# Patient Record
Sex: Female | Born: 1980 | Race: White | Hispanic: No | Marital: Single | Smoking: Current every day smoker
Health system: Southern US, Community
[De-identification: ages and names within clinical notes are randomized; demographics above are authoritative.]

## PROBLEM LIST (undated history)

## (undated) DIAGNOSIS — F4310 Post-traumatic stress disorder, unspecified: Secondary | ICD-10-CM

## (undated) DIAGNOSIS — K08129 Complete loss of teeth due to periodontal diseases, unspecified class: Secondary | ICD-10-CM

## (undated) DIAGNOSIS — F32A Depression, unspecified: Secondary | ICD-10-CM

## (undated) DIAGNOSIS — F1910 Other psychoactive substance abuse, uncomplicated: Secondary | ICD-10-CM

## (undated) DIAGNOSIS — E559 Vitamin D deficiency, unspecified: Secondary | ICD-10-CM

## (undated) DIAGNOSIS — K219 Gastro-esophageal reflux disease without esophagitis: Secondary | ICD-10-CM

## (undated) DIAGNOSIS — F410 Panic disorder [episodic paroxysmal anxiety] without agoraphobia: Secondary | ICD-10-CM

## (undated) DIAGNOSIS — F419 Anxiety disorder, unspecified: Secondary | ICD-10-CM

## (undated) DIAGNOSIS — M419 Scoliosis, unspecified: Secondary | ICD-10-CM

## (undated) DIAGNOSIS — N3281 Overactive bladder: Secondary | ICD-10-CM

## (undated) DIAGNOSIS — F329 Major depressive disorder, single episode, unspecified: Secondary | ICD-10-CM

## (undated) HISTORY — DX: Anxiety disorder, unspecified: F41.9

## (undated) HISTORY — DX: Complete loss of teeth due to periodontal diseases, unspecified class: K08.129

## (undated) HISTORY — DX: Other psychoactive substance abuse, uncomplicated: F19.10

## (undated) HISTORY — DX: Major depressive disorder, single episode, unspecified: F32.9

## (undated) HISTORY — DX: Depression, unspecified: F32.A

## (undated) HISTORY — DX: Scoliosis, unspecified: M41.9

---

## 2007-05-31 ENCOUNTER — Emergency Department (HOSPITAL_COMMUNITY): Admission: EM | Admit: 2007-05-31 | Discharge: 2007-05-31 | Payer: Self-pay | Admitting: Emergency Medicine

## 2007-09-01 ENCOUNTER — Other Ambulatory Visit: Admission: RE | Admit: 2007-09-01 | Discharge: 2007-09-01 | Payer: Self-pay | Admitting: Obstetrics and Gynecology

## 2008-03-23 ENCOUNTER — Other Ambulatory Visit: Admission: RE | Admit: 2008-03-23 | Discharge: 2008-03-23 | Payer: Self-pay | Admitting: Obstetrics and Gynecology

## 2008-08-08 ENCOUNTER — Ambulatory Visit (HOSPITAL_COMMUNITY): Admission: RE | Admit: 2008-08-08 | Discharge: 2008-08-08 | Payer: Self-pay | Admitting: Obstetrics and Gynecology

## 2008-09-27 ENCOUNTER — Other Ambulatory Visit: Admission: RE | Admit: 2008-09-27 | Discharge: 2008-09-27 | Payer: Self-pay | Admitting: Obstetrics and Gynecology

## 2009-06-02 ENCOUNTER — Emergency Department (HOSPITAL_COMMUNITY): Admission: EM | Admit: 2009-06-02 | Discharge: 2009-06-02 | Payer: Self-pay | Admitting: Emergency Medicine

## 2009-07-23 ENCOUNTER — Emergency Department (HOSPITAL_COMMUNITY): Admission: EM | Admit: 2009-07-23 | Discharge: 2009-07-23 | Payer: Self-pay | Admitting: Emergency Medicine

## 2009-08-04 ENCOUNTER — Emergency Department (HOSPITAL_COMMUNITY): Admission: EM | Admit: 2009-08-04 | Discharge: 2009-08-04 | Payer: Self-pay | Admitting: Emergency Medicine

## 2011-09-17 LAB — URINALYSIS, ROUTINE W REFLEX MICROSCOPIC
Ketones, ur: NEGATIVE
Nitrite: NEGATIVE
Protein, ur: NEGATIVE

## 2011-09-17 LAB — PREGNANCY, URINE: Preg Test, Ur: NEGATIVE

## 2012-10-10 ENCOUNTER — Emergency Department (HOSPITAL_COMMUNITY): Payer: Medicaid Other

## 2012-10-10 ENCOUNTER — Encounter (HOSPITAL_COMMUNITY): Payer: Self-pay | Admitting: *Deleted

## 2012-10-10 ENCOUNTER — Emergency Department (HOSPITAL_COMMUNITY)
Admission: EM | Admit: 2012-10-10 | Discharge: 2012-10-11 | Disposition: A | Discharge status: Home / Self Care | Payer: Medicaid Other | Attending: Emergency Medicine | Admitting: Emergency Medicine

## 2012-10-10 DIAGNOSIS — J029 Acute pharyngitis, unspecified: Secondary | ICD-10-CM | POA: Insufficient documentation

## 2012-10-10 DIAGNOSIS — R112 Nausea with vomiting, unspecified: Secondary | ICD-10-CM | POA: Insufficient documentation

## 2012-10-10 DIAGNOSIS — R197 Diarrhea, unspecified: Secondary | ICD-10-CM | POA: Insufficient documentation

## 2012-10-10 DIAGNOSIS — F17200 Nicotine dependence, unspecified, uncomplicated: Secondary | ICD-10-CM | POA: Insufficient documentation

## 2012-10-10 DIAGNOSIS — J40 Bronchitis, not specified as acute or chronic: Secondary | ICD-10-CM | POA: Insufficient documentation

## 2012-10-10 DIAGNOSIS — J3489 Other specified disorders of nose and nasal sinuses: Secondary | ICD-10-CM | POA: Insufficient documentation

## 2012-10-10 LAB — PREGNANCY, URINE: Preg Test, Ur: NEGATIVE

## 2012-10-10 LAB — URINALYSIS, ROUTINE W REFLEX MICROSCOPIC
Glucose, UA: NEGATIVE mg/dL
Leukocytes, UA: NEGATIVE
Protein, ur: NEGATIVE mg/dL
pH: 6 (ref 5.0–8.0)

## 2012-10-10 LAB — URINE MICROSCOPIC-ADD ON

## 2012-10-10 MED ORDER — OXYCODONE-ACETAMINOPHEN 5-325 MG PO TABS
1.0000 | ORAL_TABLET | Freq: Once | ORAL | Status: AC
  Administered 2012-10-10: 1 via ORAL
  Filled 2012-10-10: qty 1

## 2012-10-10 NOTE — ED Provider Notes (Signed)
History   This chart was scribed for Cheri Guppy, MD by Melba Coon, ED Scribe. The patient was seen in room APA16A/APA16A and the patient's care was started at 9:52PM.    CSN: [id number]  Arrival date & time 10/10/12  Debbie Mueller   First MD Initiated Contact with Patient 10/10/12 2146      Chief Complaint  Patient presents with  . Cough  . Sore Throat  . Nasal Congestion  . Nausea  . Diarrhea  . Emesis    (Consider location/radiation/quality/duration/timing/severity/associated sxs/prior treatment) The history is provided by the patient. No language interpreter was used.   Debbie Mueller is a 31 y.o. female who presents to the Emergency Department complaining of persistent moderate to severe fever with associated symptoms of nasal congestion, productive cough with bright green phlegm, sore throat, nausea, emesis, and diarrhea with an onset yesterday. OTC medications at home have not alleviated the symptoms. She denies any contact with sick individuals with similar symptoms. She reports chills and diaphoresis. She denies history of asthma, kidney problems, and liver problems. No other pertinent medical symptoms.  History reviewed. No pertinent past medical history.  History reviewed. No pertinent past surgical history.  History reviewed. No pertinent family history.  History  Substance Use Topics  . Smoking status: Current Every Day Smoker  . Smokeless tobacco: Not on file  . Alcohol Use: No    OB History    Grav Para Term Preterm Abortions TAB SAB Ect Mult Living                  Review of Systems  Respiratory: Positive for cough.   Gastrointestinal: Positive for vomiting and diarrhea.   10 Systems reviewed and all are negative for acute change except as noted in the HPI.   Allergies  Review of patient's allergies indicates no known allergies.  Home Medications   Current Outpatient Rx  Name  Route  Sig  Dispense  Refill  . THERAFLU COLD & SORE THROAT  PO   Oral   Take 2 capsules by mouth every 6 (six) hours as needed. Cold/sore throat           BP 129/77  Pulse 95  Temp 99.7 F (37.6 C) (Oral)  Resp 20  Ht 5\' 2"  (1.575 m)  Wt 192 lb (87.091 kg)  BMI 35.12 kg/m2  SpO2 97%  LMP 08/29/2012  Physical Exam  Nursing note and vitals reviewed. Constitutional:       Awake, alert, nontoxic appearance.  HENT:  Head: Normocephalic and atraumatic.  Mouth/Throat: Oropharynx is clear and moist.  Eyes: Right eye exhibits no discharge. Left eye exhibits no discharge.  Neck: Neck supple.  Cardiovascular: Normal rate, regular rhythm, normal heart sounds and intact distal pulses.   No murmur heard. Pulmonary/Chest: Effort normal and breath sounds normal. No respiratory distress. She has no wheezes. She has no rales. She exhibits no tenderness.  Abdominal: Soft. There is no tenderness. There is no rebound.  Musculoskeletal: She exhibits no tenderness.       Baseline ROM, no obvious new focal weakness.  Neurological:       Mental status and motor strength appears baseline for patient and situation.  Skin: No rash noted.  Psychiatric: She has a normal mood and affect.    ED Course  Procedures (including critical care time)  COORDINATION OF CARE:  9:56PM - percocet, CXR, and UA will be ordered for Mrs Halloran.   Labs Reviewed  URINALYSIS, ROUTINE W REFLEX  MICROSCOPIC - Abnormal; Notable for the following:    Hgb urine dipstick SMALL (*)     All other components within normal limits  URINE MICROSCOPIC-ADD ON - Abnormal; Notable for the following:    Squamous Epithelial / LPF MANY (*)     Bacteria, UA FEW (*)     All other components within normal limits  PREGNANCY, URINE  URINE CULTURE   No results found.   No diagnosis found.    MDM  Bronchitis   I personally performed the services described in this documentation, which was scribed in my presence. The recorded information has been reviewed and is  accurate.        Cheri Guppy, MD 10/11/12 0010

## 2012-10-10 NOTE — ED Notes (Addendum)
Pt c/o cough, congestion, nausea, vomiting, sore throat and diarrhea x 4 days. Last menstrual period 1.5 months ago.

## 2012-10-11 MED ORDER — BENZONATATE 100 MG PO CAPS: 100.0000 mg | ORAL_CAPSULE | Freq: Three times a day (TID) | ORAL | Status: DC

## 2012-10-11 MED ORDER — OXYCODONE-ACETAMINOPHEN 5-325 MG PO TABS: 2.0000 | ORAL_TABLET | ORAL | Status: DC | PRN

## 2012-10-11 NOTE — ED Notes (Signed)
Assumed care for discharge.  Discharge instructions given and reviewed with patient.  Prescriptions given for Tessalon and Percocet; effects and use explained.  Patient verbalized understanding of sedating effects of medication and states understanding not to drive or do anything that requires mental alertness while taking.  Patient ambulatory with steady gait; discharged home in good condition.

## 2012-10-12 LAB — URINE CULTURE

## 2013-12-01 DIAGNOSIS — F410 Panic disorder [episodic paroxysmal anxiety] without agoraphobia: Secondary | ICD-10-CM

## 2013-12-01 DIAGNOSIS — F4310 Post-traumatic stress disorder, unspecified: Secondary | ICD-10-CM

## 2013-12-01 DIAGNOSIS — F419 Anxiety disorder, unspecified: Secondary | ICD-10-CM

## 2013-12-01 HISTORY — DX: Anxiety disorder, unspecified: F41.9

## 2013-12-01 HISTORY — DX: Panic disorder (episodic paroxysmal anxiety): F41.0

## 2013-12-01 HISTORY — DX: Post-traumatic stress disorder, unspecified: F43.10

## 2014-10-09 IMAGING — CR DG CHEST 2V
2 series · 2 of 2 positions shown · non-contrast
Comparison: None

CLINICAL DATA: Cough and sore throat

CHEST - 2 VIEW

[view not recorded (1 of 2)]
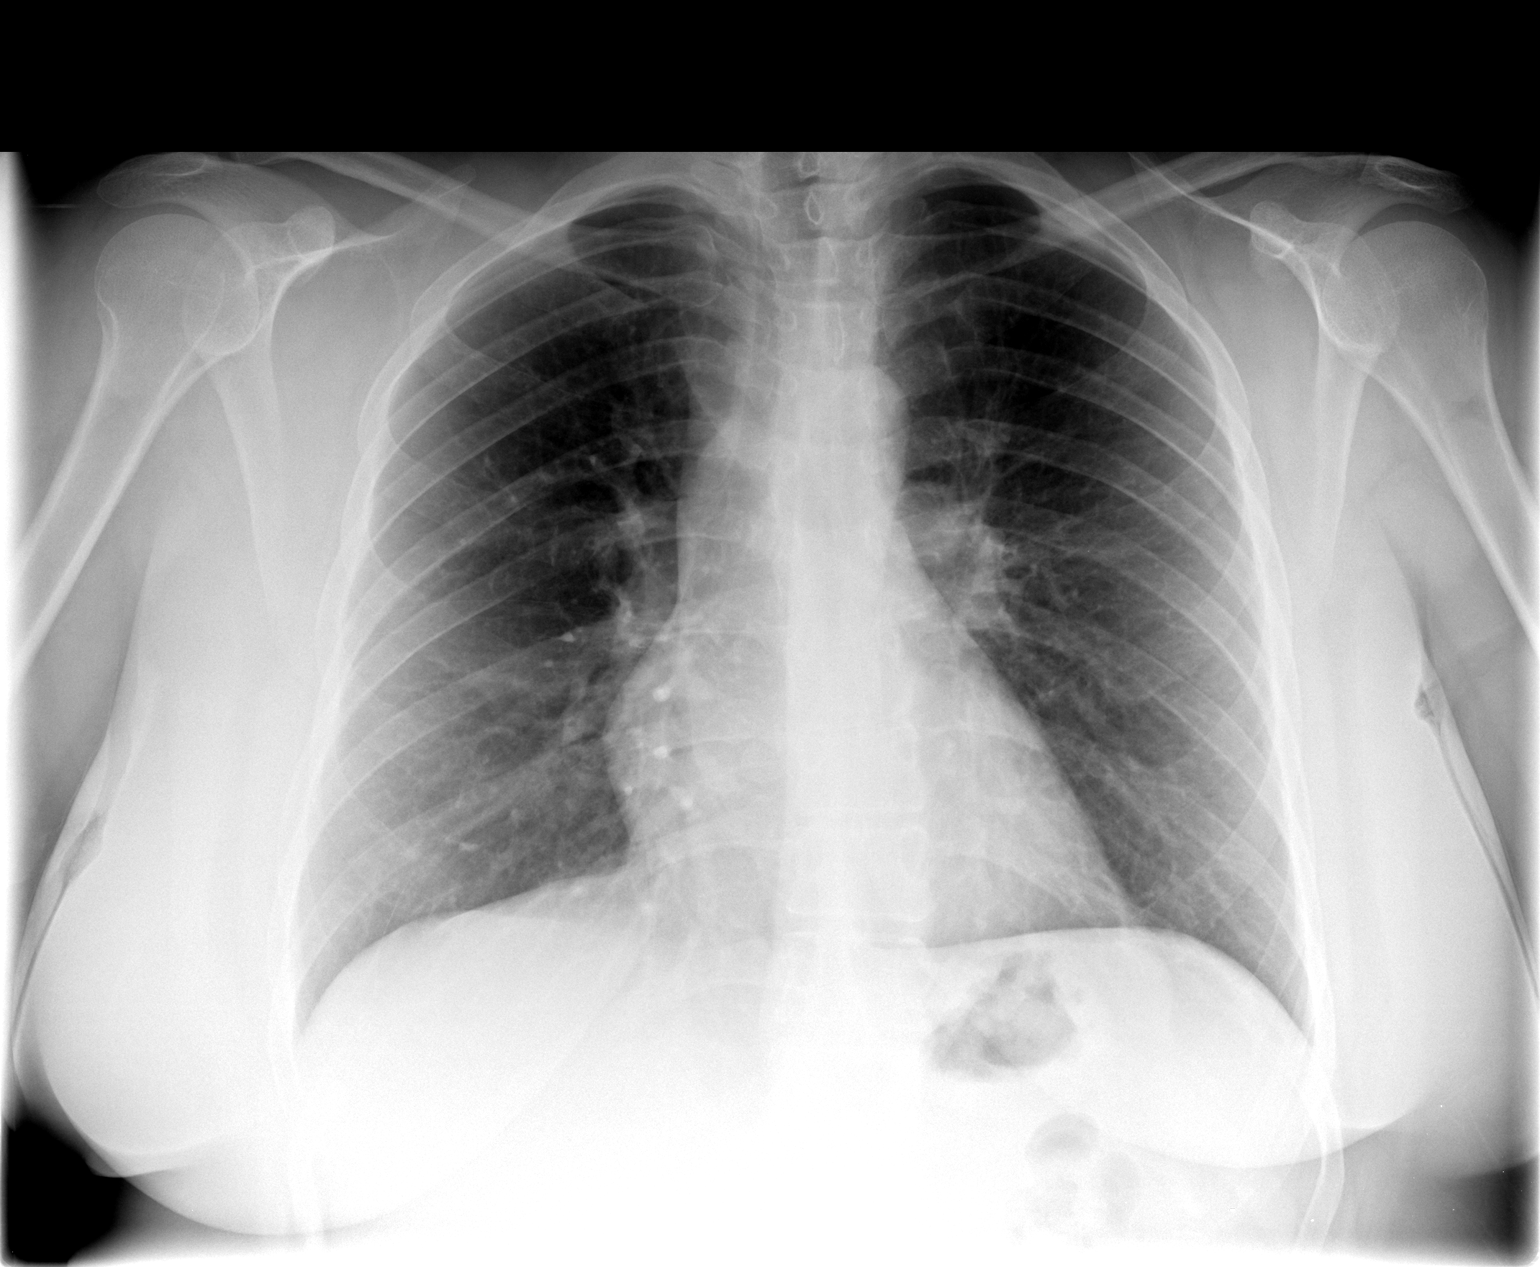

[view not recorded (2 of 2)]
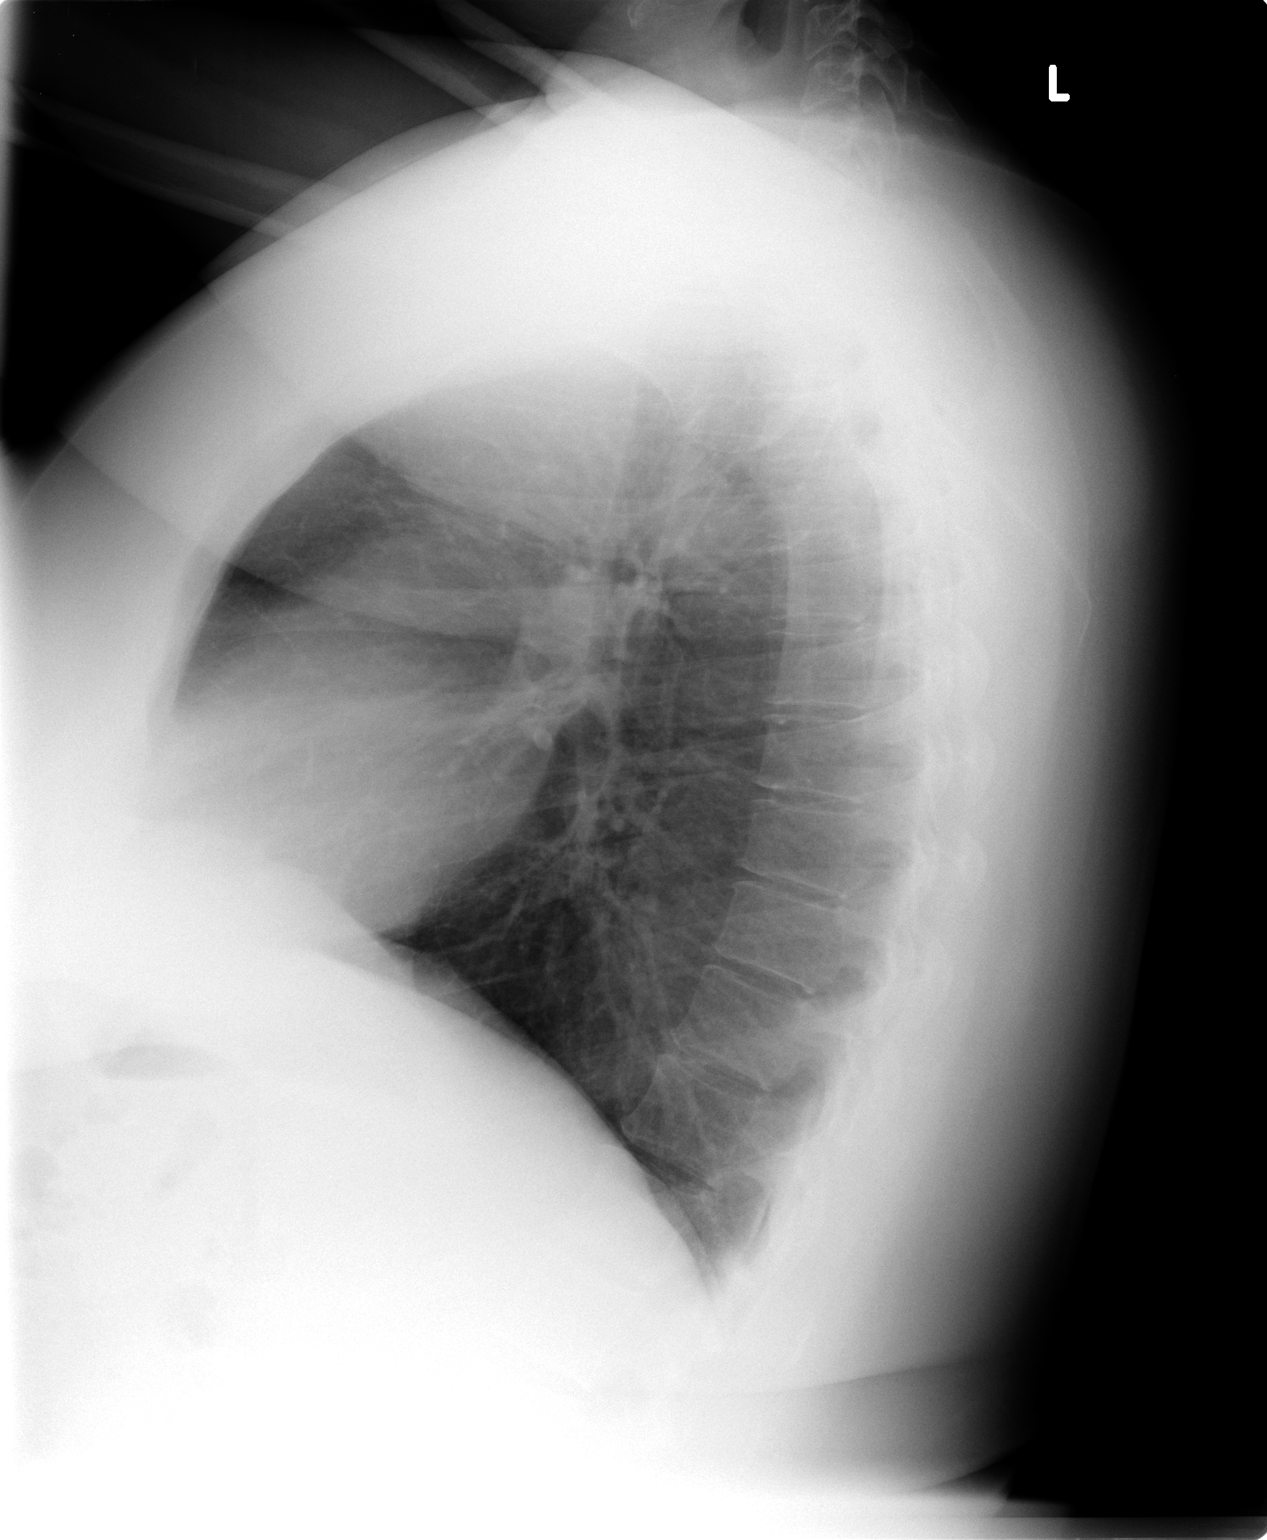

[2 of 2 positions shown; findings below may reference images not displayed]

FINDINGS: The heart size and mediastinal contours are within normal
limits.  Both lungs are clear.  The visualized skeletal structures
are unremarkable.
IMPRESSION: Negative exam.

## 2016-05-01 DIAGNOSIS — R197 Diarrhea, unspecified: Secondary | ICD-10-CM | POA: Diagnosis not present

## 2016-05-01 DIAGNOSIS — Z791 Long term (current) use of non-steroidal anti-inflammatories (NSAID): Secondary | ICD-10-CM | POA: Insufficient documentation

## 2016-05-01 DIAGNOSIS — R1010 Upper abdominal pain, unspecified: Secondary | ICD-10-CM | POA: Diagnosis not present

## 2016-05-01 DIAGNOSIS — F17200 Nicotine dependence, unspecified, uncomplicated: Secondary | ICD-10-CM | POA: Diagnosis not present

## 2016-05-01 DIAGNOSIS — Z79899 Other long term (current) drug therapy: Secondary | ICD-10-CM | POA: Insufficient documentation

## 2016-05-02 ENCOUNTER — Encounter (HOSPITAL_COMMUNITY): Payer: Self-pay | Admitting: Emergency Medicine

## 2016-05-02 ENCOUNTER — Emergency Department (HOSPITAL_COMMUNITY)
Admission: EM | Admit: 2016-05-02 | Discharge: 2016-05-02 | Disposition: A | Discharge status: Home / Self Care | Payer: Medicaid Other | Attending: Emergency Medicine | Admitting: Emergency Medicine

## 2016-05-02 ENCOUNTER — Emergency Department (HOSPITAL_COMMUNITY): Payer: Medicaid Other

## 2016-05-02 DIAGNOSIS — R109 Unspecified abdominal pain: Secondary | ICD-10-CM

## 2016-05-02 HISTORY — DX: Gastro-esophageal reflux disease without esophagitis: K21.9

## 2016-05-02 HISTORY — DX: Vitamin D deficiency, unspecified: E55.9

## 2016-05-02 HISTORY — DX: Overactive bladder: N32.81

## 2016-05-02 LAB — COMPREHENSIVE METABOLIC PANEL
ALT: 31 U/L (ref 14–54)
AST: 57 U/L — AB (ref 15–41)
Albumin: 3.7 g/dL (ref 3.5–5.0)
Alkaline Phosphatase: 70 U/L (ref 38–126)
Anion gap: 5 (ref 5–15)
BILIRUBIN TOTAL: 0.5 mg/dL (ref 0.3–1.2)
BUN: 11 mg/dL (ref 6–20)
CHLORIDE: 108 mmol/L (ref 101–111)
CO2: 24 mmol/L (ref 22–32)
CREATININE: 0.6 mg/dL (ref 0.44–1.00)
Calcium: 8.1 mg/dL — ABNORMAL LOW (ref 8.9–10.3)
Glucose, Bld: 93 mg/dL (ref 65–99)
POTASSIUM: 3.6 mmol/L (ref 3.5–5.1)
Sodium: 137 mmol/L (ref 135–145)
TOTAL PROTEIN: 6.4 g/dL — AB (ref 6.5–8.1)

## 2016-05-02 LAB — CBC WITH DIFFERENTIAL/PLATELET
BASOS ABS: 0 10*3/uL (ref 0.0–0.1)
Basophils Relative: 0 %
EOS ABS: 0.3 10*3/uL (ref 0.0–0.7)
EOS PCT: 4 %
HCT: 36.8 % (ref 36.0–46.0)
HEMOGLOBIN: 12.3 g/dL (ref 12.0–15.0)
Lymphocytes Relative: 24 %
Lymphs Abs: 2.3 10*3/uL (ref 0.7–4.0)
MCH: 29.4 pg (ref 26.0–34.0)
MCHC: 33.4 g/dL (ref 30.0–36.0)
MCV: 88 fL (ref 78.0–100.0)
Monocytes Absolute: 0.4 10*3/uL (ref 0.1–1.0)
Monocytes Relative: 5 %
NEUTROS PCT: 67 %
Neutro Abs: 6.5 10*3/uL (ref 1.7–7.7)
PLATELETS: 265 10*3/uL (ref 150–400)
RBC: 4.18 MIL/uL (ref 3.87–5.11)
RDW: 13.1 % (ref 11.5–15.5)
WBC: 9.6 10*3/uL (ref 4.0–10.5)

## 2016-05-02 LAB — LIPASE, BLOOD: LIPASE: 28 U/L (ref 11–51)

## 2016-05-02 LAB — PREGNANCY, URINE: PREG TEST UR: NEGATIVE

## 2016-05-02 MED ORDER — GI COCKTAIL ~~LOC~~
30.0000 mL | Freq: Once | ORAL | Status: AC
  Administered 2016-05-02: 30 mL via ORAL
  Filled 2016-05-02: qty 30

## 2016-05-02 MED ORDER — OMEPRAZOLE 20 MG PO CPDR: 20.0000 mg | DELAYED_RELEASE_CAPSULE | Freq: Every day | ORAL | Status: DC

## 2016-05-02 NOTE — ED Provider Notes (Signed)
CSN: [id number]     Arrival date & time 05/01/16  2353 History   First MD Initiated Contact with Patient 05/02/16 0022     Chief Complaint  Patient presents with  . Abdominal Pain      Patient is a 35 y.o. female presenting with abdominal pain.  Abdominal Pain Associated symptoms: diarrhea   Associated symptoms: no chest pain, no nausea, no shortness of breath and no vomiting   patient presents with upper abdominal pain. Was severe. States that she started to cry. Patient is feeling somewhat better now. Has had some diarrhea. States that she fell off her porch a week ago. She had her right side. No nausea vomiting. No fevers. States she was given muscle relaxants for back a couple days ago. No dysuria. No fevers. No radiation of the pain.  Past Medical History  Diagnosis Date  . Acid reflux   . Vitamin D deficiency   . Overactive bladder    History reviewed. No pertinent past surgical history. History reviewed. No pertinent family history. Social History  Substance Use Topics  . Smoking status: Current Every Day Smoker  . Smokeless tobacco: None  . Alcohol Use: No   OB History    No data available     Review of Systems  Constitutional: Negative for activity change and appetite change.  Eyes: Negative for pain.  Respiratory: Negative for chest tightness and shortness of breath.   Cardiovascular: Negative for chest pain and leg swelling.  Gastrointestinal: Positive for abdominal pain and diarrhea. Negative for nausea and vomiting.  Genitourinary: Negative for flank pain.  Musculoskeletal: Negative for back pain and neck stiffness.  Skin: Negative for rash.  Neurological: Negative for weakness, numbness and headaches.  Psychiatric/Behavioral: Negative for behavioral problems.      Allergies  Review of patient's allergies indicates no known allergies.  Home Medications   Prior to Admission medications   Medication Sig Start Date End Date Taking? Authorizing Provider   busPIRone (BUSPAR) 10 MG tablet Take 10 mg by mouth 3 (three) times daily.   Yes Historical Provider, MD  cyclobenzaprine (FLEXERIL) 10 MG tablet Take 10 mg by mouth 3 (three) times daily as needed for muscle spasms.   Yes Historical Provider, MD  ibuprofen (ADVIL,MOTRIN) 800 MG tablet Take 800 mg by mouth every 8 (eight) hours as needed.   Yes Historical Provider, MD  benzonatate (TESSALON) 100 MG capsule Take 1 capsule (100 mg total) by mouth every 8 (eight) hours. 10/11/12   Cheri Guppy, MD  omeprazole (PRILOSEC) 20 MG capsule Take 1 capsule (20 mg total) by mouth daily. 05/02/16   Benjiman Core, MD  oxyCODONE-acetaminophen (PERCOCET/ROXICET) 5-325 MG per tablet Take 2 tablets by mouth every 4 (four) hours as needed for pain. 10/11/12   Cheri Guppy, MD  Pheniramine-PE-APAP [facility] COLD & SORE THROAT PO) Take 2 capsules by mouth every 6 (six) hours as needed. Cold/sore throat    Historical Provider, MD   BP 104/63 mmHg  Pulse 92  Temp(Src) 98.7 F (37.1 C) (Oral)  Resp 18  Ht  (1.549 m)  Wt 192 lb (87.091 kg)  BMI 36.30 kg/m2  SpO2 100%  LMP 04/24/2016 Physical Exam  Constitutional: She appears well-developed.  HENT:  Head: Atraumatic.  Neck: Neck supple.  Pulmonary/Chest: Effort normal.  Abdominal: Soft. There is tenderness.  Mild epigastric tenderness without rebound or guarding.  Genitourinary:  Mild right lateral flank pain. No ecchymosis.  Musculoskeletal: Normal range of motion.  Neurological: She is  alert.  Skin: Skin is warm.    ED Course  Procedures (including critical care time) Labs Review Labs Reviewed  COMPREHENSIVE METABOLIC PANEL - Abnormal; Notable for the following:    Calcium 8.1 (*)    Total Protein 6.4 (*)    AST 57 (*)    All other components within normal limits  PREGNANCY, URINE  CBC WITH DIFFERENTIAL/PLATELET  LIPASE, BLOOD    Imaging Review Dg Abd 2 Views  05/02/2016  CLINICAL DATA:  Upper abdominal pain for 1 hour.  EXAM: ABDOMEN - 2 VIEW COMPARISON:  CT 07/02/2011 FINDINGS: The lung bases are clear. There is no free intra-abdominal air. No dilated bowel loops to suggest obstruction. Small-moderate volume of stool throughout the colon. No radiopaque calculi. No acute osseous abnormalities are seen. IMPRESSION: Normal abdominal radiographs. Electronically Signed   By: Rubye OaksMelanie  Ehinger M.D.   On: 05/02/2016 02:05   I have personally reviewed and evaluated these images and lab results as part of my medical decision-making.   EKG Interpretation None      MDM   Final diagnoses:  Abdominal pain    Patient with epigastric abdominal pain. Improved after GI cocktail. Did have recent fall but no real right upper quadrant tenderness. AST minimally elevated. Will discharge home with Prilosec. Follow-up with PCP. X-ray reassuring.    Benjiman CoreNathan Maleta Pacha, MD 05/02/16 [phone]

## 2016-05-02 NOTE — Discharge Instructions (Signed)

## 2016-05-02 NOTE — ED Notes (Signed)
Patient has c/o of mid epigastric pain x 2 days, diarrhea x 2 in past two days.  Back pain x 2 weeks.

## 2017-03-17 ENCOUNTER — Encounter: Payer: Self-pay | Admitting: Adult Health

## 2017-08-13 NOTE — Progress Notes (Deleted)
    Patient ID: Debbie Mueller, female    DOB: 1981/07/02, 36 y.o.   MRN: [id number]  No chief complaint on file.   Allergies Patient has no known allergies.  Subjective:   Debbie NajjarHeather N Mcneff is a 36 y.o. female who [facility].  HPI HPI  Past Medical History:  Diagnosis Date  . Acid reflux   . Overactive bladder   . Vitamin D deficiency     No past surgical history on file.  No family history on file.   Social History   Social History  . Marital status: Single    Spouse name: N/A  . Number of children: N/A  . Years of education: N/A   Social History Main Topics  . Smoking status: Current Every Day Smoker  . Smokeless tobacco: Not on file  . Alcohol use No  . Drug use: No  . Sexual activity: Not on file   Other Topics Concern  . Not on file   Social History Narrative  . No narrative on file    Review of Systems   Objective:   There were no vitals taken for this visit.  Physical Exam   Assessment and Plan   There are no diagnoses linked to this encounter.   No Follow-up on file. Judd GaudierShannon M Levens, CMA 08/13/2017

## 2017-08-18 ENCOUNTER — Ambulatory Visit: Payer: Self-pay | Admitting: Family Medicine

## 2017-08-31 ENCOUNTER — Encounter: Payer: Self-pay | Admitting: Adult Health

## 2017-09-08 ENCOUNTER — Encounter: Payer: Self-pay | Admitting: Adult Health

## 2017-09-08 ENCOUNTER — Encounter: Payer: Self-pay | Admitting: Obstetrics & Gynecology

## 2017-10-02 ENCOUNTER — Other Ambulatory Visit (HOSPITAL_COMMUNITY)
Admission: RE | Admit: 2017-10-02 | Discharge: 2017-10-02 | Disposition: A | Discharge status: Home / Self Care | Payer: Medicaid Other | Source: Ambulatory Visit | Attending: Adult Health | Admitting: Adult Health

## 2017-10-02 ENCOUNTER — Encounter: Payer: Self-pay | Admitting: Adult Health

## 2017-10-02 ENCOUNTER — Ambulatory Visit (INDEPENDENT_AMBULATORY_CARE_PROVIDER_SITE_OTHER): Payer: Medicaid Other | Admitting: Adult Health

## 2017-10-02 VITALS — BP 110/60 | HR 93 | Ht 62.25 in | Wt 212.0 lb

## 2017-10-02 DIAGNOSIS — Z3201 Encounter for pregnancy test, result positive: Secondary | ICD-10-CM | POA: Insufficient documentation

## 2017-10-02 DIAGNOSIS — O219 Vomiting of pregnancy, unspecified: Secondary | ICD-10-CM

## 2017-10-02 DIAGNOSIS — F17200 Nicotine dependence, unspecified, uncomplicated: Secondary | ICD-10-CM

## 2017-10-02 DIAGNOSIS — Z01419 Encounter for gynecological examination (general) (routine) without abnormal findings: Secondary | ICD-10-CM

## 2017-10-02 DIAGNOSIS — Z331 Pregnant state, incidental: Secondary | ICD-10-CM

## 2017-10-02 DIAGNOSIS — R319 Hematuria, unspecified: Secondary | ICD-10-CM

## 2017-10-02 DIAGNOSIS — Z3A01 Less than 8 weeks gestation of pregnancy: Secondary | ICD-10-CM

## 2017-10-02 DIAGNOSIS — Z01411 Encounter for gynecological examination (general) (routine) with abnormal findings: Secondary | ICD-10-CM | POA: Diagnosis not present

## 2017-10-02 DIAGNOSIS — O3680X0 Pregnancy with inconclusive fetal viability, not applicable or unspecified: Secondary | ICD-10-CM | POA: Insufficient documentation

## 2017-10-02 DIAGNOSIS — Z124 Encounter for screening for malignant neoplasm of cervix: Secondary | ICD-10-CM | POA: Insufficient documentation

## 2017-10-02 LAB — POCT URINE PREGNANCY: PREG TEST UR: POSITIVE — AB

## 2017-10-02 LAB — POCT URINALYSIS DIPSTICK
Glucose, UA: NEGATIVE
Ketones, UA: NEGATIVE
Leukocytes, UA: NEGATIVE
Nitrite, UA: NEGATIVE
PROTEIN UA: NEGATIVE

## 2017-10-02 MED ORDER — DOXYLAMINE-PYRIDOXINE ER 20-20 MG PO TBCR: 1.0000 | EXTENDED_RELEASE_TABLET | Freq: Two times a day (BID) | ORAL | 0 refills | Status: DC

## 2017-10-02 NOTE — Patient Instructions (Signed)
First Trimester of Pregnancy The first trimester of pregnancy is from week 1 until the end of week 13 (months 1 through 3). A week after a sperm fertilizes an egg, the egg will implant on the wall of the uterus. This embryo will begin to develop into a baby. Genes from you and your partner will form the baby. The female genes will determine whether the baby will be a boy or a girl. At 6-8 weeks, the eyes and face will be formed, and the heartbeat can be seen on ultrasound. At the end of 12 weeks, all the baby's organs will be formed. Now that you are pregnant, you will want to do everything you can to have a healthy baby. Two of the most important things are to get good prenatal care and to follow your health care provider's instructions. Prenatal care is all the medical care you receive before the baby's birth. This care will help prevent, find, and treat any problems during the pregnancy and childbirth. Body changes during your first trimester Your body goes through many changes during pregnancy. The changes vary from woman to woman.  You may gain or lose a couple of pounds at first.  You may feel sick to your stomach (nauseous) and you may throw up (vomit). If the vomiting is uncontrollable, call your health care provider.  You may tire easily.  You may develop headaches that can be relieved by medicines. All medicines should be approved by your health care provider.  You may urinate more often. Painful urination may mean you have a bladder infection.  You may develop heartburn as a result of your pregnancy.  You may develop constipation because certain hormones are causing the muscles that push stool through your intestines to slow down.  You may develop hemorrhoids or swollen veins (varicose veins).  Your breasts may begin to grow larger and become tender. Your nipples may stick out more, and the tissue that surrounds them (areola) may become darker.  Your gums may bleed and may be  sensitive to brushing and flossing.  Dark spots or blotches (chloasma, mask of pregnancy) may develop on your face. This will likely fade after the baby is born.  Your menstrual periods will stop.  You may have a loss of appetite.  You may develop cravings for certain kinds of food.  You may have changes in your emotions from day to day, such as being excited to be pregnant or being concerned that something may go wrong with the pregnancy and baby.  You may have more vivid and strange dreams.  You may have changes in your hair. These can include thickening of your hair, rapid growth, and changes in texture. Some women also have hair loss during or after pregnancy, or hair that feels dry or thin. Your hair will most likely return to normal after your baby is born.  What to expect at prenatal visits During a routine prenatal visit:  You will be weighed to make sure you and the baby are growing normally.  Your blood pressure will be taken.  Your abdomen will be measured to track your baby's growth.  The fetal heartbeat will be listened to between weeks 10 and 14 of your pregnancy.  Test results from any previous visits will be discussed.  Your health care provider may ask you:  How you are feeling.  If you are feeling the baby move.  If you have had any abnormal symptoms, such as leaking fluid, bleeding, severe headaches,   or abdominal cramping.  If you are using any tobacco products, including cigarettes, chewing tobacco, and electronic cigarettes.  If you have any questions.  Other tests that may be performed during your first trimester include:  Blood tests to find your blood type and to check for the presence of any previous infections. The tests will also be used to check for low iron levels (anemia) and protein on red blood cells (Rh antibodies). Depending on your risk factors, or if you previously had diabetes during pregnancy, you may have tests to check for high blood  sugar that affects pregnant women (gestational diabetes).  Urine tests to check for infections, diabetes, or protein in the urine.  An ultrasound to confirm the proper growth and development of the baby.  Fetal screens for spinal cord problems (spina bifida) and Down syndrome.  HIV (human immunodeficiency virus) testing. Routine prenatal testing includes screening for HIV, unless you choose not to have this test.  You may need other tests to make sure you and the baby are doing well.  Follow these instructions at home: Medicines  Follow your health care provider's instructions regarding medicine use. Specific medicines may be either safe or unsafe to take during pregnancy.  Take a prenatal vitamin that contains at least 600 micrograms (mcg) of folic acid.  If you develop constipation, try taking a stool softener if your health care provider approves. Eating and drinking  Eat a balanced diet that includes fresh fruits and vegetables, whole grains, good sources of protein such as meat, eggs, or tofu, and low-fat dairy. Your health care provider will help you determine the amount of weight gain that is right for you.  Avoid raw meat and uncooked cheese. These carry germs that can cause birth defects in the baby.  Eating four or five small meals rather than three large meals a day may help relieve nausea and vomiting. If you start to feel nauseous, eating a few soda crackers can be helpful. Drinking liquids between meals, instead of during meals, also seems to help ease nausea and vomiting.  Limit foods that are high in fat and processed sugars, such as fried and sweet foods.  To prevent constipation: ? Eat foods that are high in fiber, such as fresh fruits and vegetables, whole grains, and beans. ? Drink enough fluid to keep your urine clear or pale yellow. Activity  Exercise only as directed by your health care provider. Most women can continue their usual exercise routine during  pregnancy. Try to exercise for 30 minutes at least 5 days a week. Exercising will help you: ? Control your weight. ? Stay in shape. ? Be prepared for labor and delivery.  Experiencing pain or cramping in the lower abdomen or lower back is a good sign that you should stop exercising. Check with your health care provider before continuing with normal exercises.  Try to avoid standing for long periods of time. Move your legs often if you must stand in one place for a long time.  Avoid heavy lifting.  Wear low-heeled shoes and practice good posture.  You may continue to have sex unless your health care provider tells you not to. Relieving pain and discomfort  Wear a good support bra to relieve breast tenderness.  Take warm sitz baths to soothe any pain or discomfort caused by hemorrhoids. Use hemorrhoid cream if your health care provider approves.  Rest with your legs elevated if you have leg cramps or low back pain.  If you develop   varicose veins in your legs, wear support hose. Elevate your feet for 15 minutes, 3-4 times a day. Limit salt in your diet. Prenatal care  Schedule your prenatal visits by the twelfth week of pregnancy. They are usually scheduled monthly at first, then more often in the last 2 months before delivery.  Write down your questions. Take them to your prenatal visits.  Keep all your prenatal visits as told by your health care provider. This is important. Safety  Wear your seat belt at all times when driving.  Make a list of emergency phone numbers, including numbers for family, friends, the hospital, and police and fire departments. General instructions  Ask your health care provider for a referral to a local prenatal education class. Begin classes no later than the beginning of month 6 of your pregnancy.  Ask for help if you have counseling or nutritional needs during pregnancy. Your health care provider can offer advice or refer you to specialists for help  with various needs.  Do not use hot tubs, steam rooms, or saunas.  Do not douche or use tampons or scented sanitary pads.  Do not cross your legs for long periods of time.  Avoid cat litter boxes and soil used by cats. These carry germs that can cause birth defects in the baby and possibly loss of the fetus by miscarriage or stillbirth.  Avoid all smoking, herbs, alcohol, and medicines not prescribed by your health care provider. Chemicals in these products affect the formation and growth of the baby.  Do not use any products that contain nicotine or tobacco, such as cigarettes and e-cigarettes. If you need help quitting, ask your health care provider. You may receive counseling support and other resources to help you quit.  Schedule a dentist appointment. At home, brush your teeth with a soft toothbrush and be gentle when you floss. Contact a health care provider if:  You have dizziness.  You have mild pelvic cramps, pelvic pressure, or nagging pain in the abdominal area.  You have persistent nausea, vomiting, or diarrhea.  You have a bad smelling vaginal discharge.  You have pain when you urinate.  You notice increased swelling in your face, hands, legs, or ankles.  You are exposed to fifth disease or chickenpox.  You are exposed to German measles (rubella) and have never had it. Get help right away if:  You have a fever.  You are leaking fluid from your vagina.  You have spotting or bleeding from your vagina.  You have severe abdominal cramping or pain.  You have rapid weight gain or loss.  You vomit blood or material that looks like coffee grounds.  You develop a severe headache.  You have shortness of breath.  You have any kind of trauma, such as from a fall or a car accident. Summary  The first trimester of pregnancy is from week 1 until the end of week 13 (months 1 through 3).  Your body goes through many changes during pregnancy. The changes vary from  woman to woman.  You will have routine prenatal visits. During those visits, your health care provider will examine you, discuss any test results you may have, and talk with you about how you are feeling. This information is not intended to replace advice given to you by your health care provider. Make sure you discuss any questions you have with your health care provider. Document Released: 11/11/2001 Document Revised: 10/29/2016 Document Reviewed: 10/29/2016 Elsevier Interactive Patient Education  2017 Elsevier   Inc.  

## 2017-10-02 NOTE — Progress Notes (Signed)
Patient ID: Debbie NajjarHeather N Mueller, female   DOB: Jul 15, 1981, 36 y.o.   MRN: [id number] History of Present Illness: Debbie SetaHeather is a 36 year old white female in for well woman gyn exam and pap.She is having nausea and vomiting some and has missed a period and thinks she is pregnant and leaks urine at times. PCP is Dr Jackolyn Conferapper's office in [place].  Current Medications, Allergies, Past Medical History, Past Surgical History, Family History and Social History were reviewed in [facility] electronic medical record.     Review of Systems: Patient denies any headaches, hearing loss, fatigue, blurred vision, shortness of breath, chest pain, abdominal pain, problems with bowel movements, urination(leaks urine at times), or intercourse. No joint pain or mood swings.+nause and vomiting, and has missed a period    Physical Exam:BP 110/60 (BP Location: Left Arm, Patient Position: Sitting, Cuff Size: Large)   Pulse 93   Ht 5' 2.25" (1.581 m)   Wt 212 lb (96.2 kg)   LMP 08/19/2017 (Approximate)   BMI 38.46 kg/m UPT +, about 6+2 weeks by LMP, EDD 05/26/18,urine dipstick trace blood. General:  Well developed, well nourished, no acute distress Skin:  Warm and dry Neck:  Midline trachea, normal thyroid, good ROM, no lymphadenopathy Lungs; Clear to auscultation bilaterally Breast:  No dominant palpable mass, retraction, or nipple discharge,has red rash both breasts and looks like she has scratched some. Cardiovascular: Regular rate and rhythm Abdomen:  Soft, non tender, no hepatosplenomegaly Pelvic:  External genitalia is normal in appearance, no lesions.  The vagina is normal in appearance. Urethra has no lesions or masses. The cervix is bulbous. Pap with HPV and GC/CHL performed. Uterus is felt to be normal size, shape, and contour.  No adnexal masses or tenderness noted.Bladder is non tender, no masses felt. Extremities/musculoskeletal:  No swelling or varicosities noted, no clubbing or cyanosis Psych:  No mood  changes, alert and cooperative,seems happy PHQ 2 score 0.  Impression: 1. Encounter for gynecological examination with Papanicolaou smear of cervix   2. Routine cervical smear   3. Hematuria, unspecified type   4. Pregnancy examination or test, positive result   5. Less than [redacted] weeks gestation of pregnancy   6. Nausea and vomiting during pregnancy prior to [redacted] weeks gestation   7. Encounter to determine fetal viability of pregnancy, single or unspecified fetus   8. Smoker       Plan: Take PNV UA C&S sent Given #24 tab bonjesta to take 1 bid lot [id number] exp 11/30/17.  Return in 1 week for dating US Review handouts on first trimester and by [facility]  Start decreasing cigarettes

## 2017-10-03 LAB — URINALYSIS, ROUTINE W REFLEX MICROSCOPIC
Bilirubin, UA: NEGATIVE
GLUCOSE, UA: NEGATIVE
Ketones, UA: NEGATIVE
LEUKOCYTES UA: NEGATIVE
NITRITE UA: NEGATIVE
Protein, UA: NEGATIVE
RBC, UA: NEGATIVE
SPEC GRAV UA: 1.022 (ref 1.005–1.030)
UUROB: 0.2 mg/dL (ref 0.2–1.0)
pH, UA: 8.5 — ABNORMAL HIGH (ref 5.0–7.5)

## 2017-10-04 LAB — URINE CULTURE

## 2017-10-06 LAB — CYTOLOGY - PAP
Chlamydia: NEGATIVE
DIAGNOSIS: UNDETERMINED — AB
HPV (WINDOPATH): NOT DETECTED
Neisseria Gonorrhea: NEGATIVE

## 2017-10-07 ENCOUNTER — Other Ambulatory Visit: Payer: Medicaid Other

## 2017-10-09 ENCOUNTER — Other Ambulatory Visit: Payer: Medicaid Other

## 2017-10-14 ENCOUNTER — Ambulatory Visit (INDEPENDENT_AMBULATORY_CARE_PROVIDER_SITE_OTHER): Payer: Medicaid Other

## 2017-10-14 ENCOUNTER — Other Ambulatory Visit: Payer: Medicaid Other

## 2017-10-14 DIAGNOSIS — O3680X0 Pregnancy with inconclusive fetal viability, not applicable or unspecified: Secondary | ICD-10-CM | POA: Diagnosis not present

## 2017-10-14 DIAGNOSIS — Z3A08 8 weeks gestation of pregnancy: Secondary | ICD-10-CM

## 2017-10-14 NOTE — Progress Notes (Signed)
US 12+2 wks,single IUP,positive fht 173 bpm,normal ovaries bilat,crl 57.47 mm,pt will come back for NT,EDD 04/26/2018 by [place]

## 2017-10-15 ENCOUNTER — Other Ambulatory Visit: Payer: Self-pay | Admitting: Obstetrics & Gynecology

## 2017-10-15 DIAGNOSIS — Z3682 Encounter for antenatal screening for nuchal translucency: Secondary | ICD-10-CM

## 2017-10-19 ENCOUNTER — Other Ambulatory Visit: Payer: Medicaid Other

## 2017-10-26 ENCOUNTER — Telehealth: Payer: Self-pay | Admitting: *Deleted

## 2017-10-27 NOTE — Telephone Encounter (Signed)
Attempted to call pt with results. Her father states that she is not there and has no minutes left on her phone. Will wait for her to call us back.

## 2017-10-28 ENCOUNTER — Ambulatory Visit (INDEPENDENT_AMBULATORY_CARE_PROVIDER_SITE_OTHER): Payer: Medicaid Other | Admitting: Advanced Practice Midwife

## 2017-10-28 ENCOUNTER — Encounter: Payer: Self-pay | Admitting: Advanced Practice Midwife

## 2017-10-28 ENCOUNTER — Ambulatory Visit: Payer: Medicaid Other | Admitting: *Deleted

## 2017-10-28 VITALS — BP 120/80 | HR 93 | Wt 219.0 lb

## 2017-10-28 DIAGNOSIS — Z331 Pregnant state, incidental: Secondary | ICD-10-CM | POA: Diagnosis not present

## 2017-10-28 DIAGNOSIS — O99332 Smoking (tobacco) complicating pregnancy, second trimester: Secondary | ICD-10-CM

## 2017-10-28 DIAGNOSIS — Z23 Encounter for immunization: Secondary | ICD-10-CM

## 2017-10-28 DIAGNOSIS — Z98891 History of uterine scar from previous surgery: Secondary | ICD-10-CM | POA: Insufficient documentation

## 2017-10-28 DIAGNOSIS — Z3490 Encounter for supervision of normal pregnancy, unspecified, unspecified trimester: Secondary | ICD-10-CM | POA: Insufficient documentation

## 2017-10-28 DIAGNOSIS — O34219 Maternal care for unspecified type scar from previous cesarean delivery: Secondary | ICD-10-CM

## 2017-10-28 DIAGNOSIS — Z363 Encounter for antenatal screening for malformations: Secondary | ICD-10-CM

## 2017-10-28 DIAGNOSIS — Z3A14 14 weeks gestation of pregnancy: Secondary | ICD-10-CM

## 2017-10-28 DIAGNOSIS — F17200 Nicotine dependence, unspecified, uncomplicated: Secondary | ICD-10-CM

## 2017-10-28 DIAGNOSIS — Z3482 Encounter for supervision of other normal pregnancy, second trimester: Secondary | ICD-10-CM

## 2017-10-28 DIAGNOSIS — Z1389 Encounter for screening for other disorder: Secondary | ICD-10-CM

## 2017-10-28 LAB — POCT URINALYSIS DIPSTICK
Glucose, UA: NEGATIVE
Ketones, UA: NEGATIVE
Leukocytes, UA: NEGATIVE
NITRITE UA: NEGATIVE
Protein, UA: NEGATIVE
RBC UA: NEGATIVE

## 2017-10-28 NOTE — Addendum Note (Signed)
Addended by: Jacklyn ShellRESENZO-DISHMON, Aundreya Souffrant on: 10/28/2017 11:25 AM   Modules accepted: Orders

## 2017-10-28 NOTE — Progress Notes (Signed)
Subjective:    Debbie NajjarHeather N Armel is a [id number] 917w2d being seen today for her first obstetrical visit.  Her obstetrical history is significant for smoker and cs w/last child for breech. Desire TOLAC.  Pregnancy history fully reviewed. Cut down from 1ppd to 4 cigs day .  Wants to quit.  Tips given, including patches, ecigs, [place] quitline. Declined   Patient reports no complaints.  Vitals:   10/28/17 1025  BP: 120/80  Pulse: 93  Weight: 219 lb (99.3 kg)    HISTORY: OB History  Gravida Para Term Preterm AB Living  3 2 2     2   SAB TAB Ectopic Multiple Live Births          2    # Outcome Date GA Lbr Len/2nd Weight Sex Delivery Anes PTL Lv  3 Current           2 Term 01/25/10 8729w5d  7 lb 8 oz (3.402 kg) F CS-Unspec Spinal N LIV     Birth Comments: breech  1 Term 03/29/03 5864w0d  8 lb 7 oz (3.827 kg) M Vag-Spont EPI N LIV     Past Medical History:  Diagnosis Date  . Acid reflux   . Anxiety   . Depression    PP depression  . Loss of teeth due to gum disease    wears dentures  . Overactive bladder   . Scoliosis   . Vitamin D deficiency    Past Surgical History:  Procedure Laterality Date  . CESAREAN SECTION     Family History  Problem Relation Age of Onset  . Diabetes Paternal Grandfather   . Hypertension Paternal Grandfather   . Diabetes Paternal Grandmother   . Hypertension Paternal Grandmother   . Heart attack Maternal Grandmother   . Heart attack Maternal Grandfather   . Anemia Father   . Hypertension Father   . Breast cancer Mother   . Depression Brother      Exam       Pelvic Exam:    Perineum: Normal Perineum   Vulva: normal   Vagina:  normal mucosa, normal discharge, no palpable nodules   Uterus Normal, Gravid, FH: 14     Cervix: normal   Adnexa: Not palpable   Urinary:  urethral meatus normal    System:     Skin: normal coloration and turgor, no rashes    Neurologic: oriented, normal, normal mood   Extremities: normal strength, tone, and muscle  mass   HEENT PERRLA   Mouth/Teeth mucous membranes moist, wears dentures   Neck supple and no masses   Cardiovascular: regular rate and rhythm   Respiratory:  appears well, vitals normal, no respiratory distress, acyanotic   Abdomen: soft, non-tender;  FHR: 160        The nature [facility] with multiple MDs and other Advanced Practice Providers was explained to patient; also emphasized that residents, students are part of our team.  Assessment:    Pregnancy: [id number] Patient Active Problem List   Diagnosis Date Noted  . Supervision of normal pregnancy 10/28/2017  . History of cesarean delivery 10/28/2017  . Nausea and vomiting during pregnancy prior to [redacted] weeks gestation 10/02/2017  . Less than [redacted] weeks gestation of pregnancy 10/02/2017  . Pregnancy examination or test, positive result 10/02/2017  . Hematuria 10/02/2017  . Encounter to determine fetal viability of pregnancy 10/02/2017  . Smoker 10/02/2017        Plan:  Initial labs drawn. Continue prenatal vitamins  Problem list reviewed and updated  Reviewed n/v relief measures and warning s/s to report  Reviewed recommended weight gain based on pre-gravid BMI  Encouraged well-balanced diet Genetic Screening discussed Quad Screen: requested.  Ultrasound discussed; fetal survey: requested.  Return in about 4 weeks (around 11/25/2017) for LROB, JX:BJYNWGNS:Anatomy.  CRESENZO-DISHMAN,Obe Ahlers 10/28/2017

## 2017-10-28 NOTE — Patient Instructions (Signed)
Safe Medications in Pregnancy   Acne: Benzoyl Peroxide Salicylic Acid  Backache/Headache: Tylenol: 2 regular strength every 4 hours OR              2 Extra strength every 6 hours  Colds/Coughs/Allergies: Benadryl (alcohol free) 25 mg every 6 hours as needed Breath right strips Claritin Cepacol throat lozenges Chloraseptic throat spray Cold-Eeze- up to three times per day Cough drops, alcohol free Flonase (by prescription only) Guaifenesin Mucinex Robitussin DM (plain only, alcohol free) Saline nasal spray/drops Sudafed (pseudoephedrine) & Actifed ** use only after [redacted] weeks gestation and if you do not have high blood pressure Tylenol Vicks Vaporub Zinc lozenges Zyrtec   Constipation: Colace Ducolax suppositories Fleet enema Glycerin suppositories Metamucil Milk of magnesia Miralax Senokot Smooth move tea  Diarrhea: Kaopectate Imodium A-D  *NO pepto Bismol  Hemorrhoids: Anusol Anusol HC Preparation H Tucks  Indigestion: Tums Maalox Mylanta Zantac  Pepcid  Insomnia: Benadryl (alcohol free) 25mg  every 6 hours as needed Tylenol PM Unisom, no Gelcaps  Leg Cramps: Tums MagGel  Nausea/Vomiting:  Bonine Dramamine Emetrol Ginger extract Sea bands Meclizine  Nausea medication to take during pregnancy:  Unisom (doxylamine succinate 25 mg tablets) Take one tablet daily at bedtime. If symptoms are not adequately controlled, the dose can be increased to a maximum recommended dose of two tablets daily (1/2 tablet in the morning, 1/2 tablet mid-afternoon and one at bedtime). Vitamin B6 100mg  tablets. Take one tablet twice a day (up to 200 mg per day).  Skin Rashes: Aveeno products Benadryl cream or 25mg  every 6 hours as needed Calamine Lotion 1% cortisone cream  Yeast infection: Gyne-lotrimin 7 Monistat 7   **If taking multiple medications, please check labels to avoid duplicating the same active ingredients **take medication as directed on  the label ** Do not exceed 4000 mg of tylenol in 24 hours **Do not take medications that contain aspirin or ibuprofen     Dava NajjarHeather N Goethe, I greatly value your feedback.  If you receive a survey following your visit with us today, we appreciate you taking the time to fill it out.  Thanks, Cathie BeamsFran Cresenzo-Dishmon, CNM     Second Trimester of Pregnancy The second trimester is from week 14 through week 27 (months 4 through 6). The second trimester is often a time when you feel your best. Your body has adjusted to being pregnant, and you begin to feel better physically. Usually, morning sickness has lessened or quit completely, you may have more energy, and you may have an increase in appetite. The second trimester is also a time when the fetus is growing rapidly. At the end of the sixth month, the fetus is about 9 inches long and weighs about 1 pounds. You will likely begin to feel the baby move (quickening) between 16 and 20 weeks of pregnancy. Body changes during your second trimester Your body continues to go through many changes during your second trimester. The changes vary from woman to woman.  Your weight will continue to increase. You will notice your lower abdomen bulging out.  You may begin to get stretch marks on your hips, abdomen, and breasts.  You may develop headaches that can be relieved by medicines. The medicines should be approved by your health care provider.  You may urinate more often because the fetus is pressing on your bladder.  You may develop or continue to have heartburn as a result of your pregnancy.  You may develop constipation because certain hormones are causing the  muscles that push waste through your intestines to slow down.  You may develop hemorrhoids or swollen, bulging veins (varicose veins).  You may have back pain. This is caused by: ? Weight gain. ? Pregnancy hormones that are relaxing the joints in your pelvis. ? A shift in weight and the  muscles that support your balance.  Your breasts will continue to grow and they will continue to become tender.  Your gums may bleed and may be sensitive to brushing and flossing.  Dark spots or blotches (chloasma, mask of pregnancy) may develop on your face. This will likely fade after the baby is born.  A dark line from your belly button to the pubic area (linea nigra) may appear. This will likely fade after the baby is born.  You may have changes in your hair. These can include thickening of your hair, rapid growth, and changes in texture. Some women also have hair loss during or after pregnancy, or hair that feels dry or thin. Your hair will most likely return to normal after your baby is born.  What to expect at prenatal visits During a routine prenatal visit:  You will be weighed to make sure you and the fetus are growing normally.  Your blood pressure will be taken.  Your abdomen will be measured to track your baby's growth.  The fetal heartbeat will be listened to.  Any test results from the previous visit will be discussed.  Your health care provider may ask you:  How you are feeling.  If you are feeling the baby move.  If you have had any abnormal symptoms, such as leaking fluid, bleeding, severe headaches, or abdominal cramping.  If you are using any tobacco products, including cigarettes, chewing tobacco, and electronic cigarettes.  If you have any questions.  Other tests that may be performed during your second trimester include:  Blood tests that check for: ? Low iron levels (anemia). ? High blood sugar that affects pregnant women (gestational diabetes) between 46 and 28 weeks. ? Rh antibodies. This is to check for a protein on red blood cells (Rh factor).  Urine tests to check for infections, diabetes, or protein in the urine.  An ultrasound to confirm the proper growth and development of the baby.  An amniocentesis to check for possible genetic  problems.  Fetal screens for spina bifida and Down syndrome.  HIV (human immunodeficiency virus) testing. Routine prenatal testing includes screening for HIV, unless you choose not to have this test.  Follow these instructions at home: Medicines  Follow your health care provider's instructions regarding medicine use. Specific medicines may be either safe or unsafe to take during pregnancy.  Take a prenatal vitamin that contains at least 600 micrograms (mcg) of folic acid.  If you develop constipation, try taking a stool softener if your health care provider approves. Eating and drinking  Eat a balanced diet that includes fresh fruits and vegetables, whole grains, good sources of protein such as meat, eggs, or tofu, and low-fat dairy. Your health care provider will help you determine the amount of weight gain that is right for you.  Avoid raw meat and uncooked cheese. These carry germs that can cause birth defects in the baby.  If you have low calcium intake from food, talk to your health care provider about whether you should take a daily calcium supplement.  Limit foods that are high in fat and processed sugars, such as fried and sweet foods.  To prevent constipation: ? Drink  enough fluid to keep your urine clear or pale yellow. ? Eat foods that are high in fiber, such as fresh fruits and vegetables, whole grains, and beans. Activity  Exercise only as directed by your health care provider. Most women can continue their usual exercise routine during pregnancy. Try to exercise for 30 minutes at least 5 days a week. Stop exercising if you experience uterine contractions.  Avoid heavy lifting, wear low heel shoes, and practice good posture.  A sexual relationship may be continued unless your health care provider directs you otherwise. Relieving pain and discomfort  Wear a good support bra to prevent discomfort from breast tenderness.  Take warm sitz baths to soothe any pain or  discomfort caused by hemorrhoids. Use hemorrhoid cream if your health care provider approves.  Rest with your legs elevated if you have leg cramps or low back pain.  If you develop varicose veins, wear support hose. Elevate your feet for 15 minutes, 3-4 times a day. Limit salt in your diet. Prenatal Care  Write down your questions. Take them to your prenatal visits.  Keep all your prenatal visits as told by your health care provider. This is important. Safety  Wear your seat belt at all times when driving.  Make a list of emergency phone numbers, including numbers for family, friends, the hospital, and police and fire departments. General instructions  Ask your health care provider for a referral to a local prenatal education class. Begin classes no later than the beginning of month 6 of your pregnancy.  Ask for help if you have counseling or nutritional needs during pregnancy. Your health care provider can offer advice or refer you to specialists for help with various needs.  Do not use hot tubs, steam rooms, or saunas.  Do not douche or use tampons or scented sanitary pads.  Do not cross your legs for long periods of time.  Avoid cat litter boxes and soil used by cats. These carry germs that can cause birth defects in the baby and possibly loss of the fetus by miscarriage or stillbirth.  Avoid all smoking, herbs, alcohol, and unprescribed drugs. Chemicals in these products can affect the formation and growth of the baby.  Do not use any products that contain nicotine or tobacco, such as cigarettes and e-cigarettes. If you need help quitting, ask your health care provider.  Visit your dentist if you have not gone yet during your pregnancy. Use a soft toothbrush to brush your teeth and be gentle when you floss. Contact a health care provider if:  You have dizziness.  You have mild pelvic cramps, pelvic pressure, or nagging pain in the abdominal area.  You have persistent  nausea, vomiting, or diarrhea.  You have a bad smelling vaginal discharge.  You have pain when you urinate. Get help right away if:  You have a fever.  You are leaking fluid from your vagina.  You have spotting or bleeding from your vagina.  You have severe abdominal cramping or pain.  You have rapid weight gain or weight loss.  You have shortness of breath with chest pain.  You notice sudden or extreme swelling of your face, hands, ankles, feet, or legs.  You have not felt your baby move in over an hour.  You have severe headaches that do not go away when you take medicine.  You have vision changes. Summary  The second trimester is from week 14 through week 27 (months 4 through 6). It is also a  time when the fetus is growing rapidly.  Your body goes through many changes during pregnancy. The changes vary from woman to woman.  Avoid all smoking, herbs, alcohol, and unprescribed drugs. These chemicals affect the formation and growth your baby.  Do not use any tobacco products, such as cigarettes, chewing tobacco, and e-cigarettes. If you need help quitting, ask your health care provider.  Contact your health care provider if you have any questions. Keep all prenatal visits as told by your health care provider. This is important. This information is not intended to replace advice given to you by your health care provider. Make sure you discuss any questions you have with your health care provider.      CHILDBIRTH CLASSES [phone] is the phone number for Pregnancy Classes or hospital tours at [facility].   You will be referred to  [email or web address] for more information on childbirth classes  At this site you may register for classes. You may sign up for a waiting list if classes are full. Please SIGN UP FOR THIS!.   When the waiting list becomes long, sometimes new classes  can be added.

## 2017-10-29 LAB — URINALYSIS, ROUTINE W REFLEX MICROSCOPIC
BILIRUBIN UA: NEGATIVE
GLUCOSE, UA: NEGATIVE
KETONES UA: NEGATIVE
Leukocytes, UA: NEGATIVE
NITRITE UA: NEGATIVE
PROTEIN UA: NEGATIVE
RBC UA: NEGATIVE
UUROB: 0.2 mg/dL (ref 0.2–1.0)
pH, UA: 7.5 (ref 5.0–7.5)

## 2017-10-29 LAB — GC/CHLAMYDIA PROBE AMP
CHLAMYDIA, DNA PROBE: NEGATIVE
NEISSERIA GONORRHOEAE BY PCR: NEGATIVE

## 2017-10-29 LAB — OBSTETRIC PANEL, INCLUDING HIV
ANTIBODY SCREEN: NEGATIVE
BASOS: 0 %
Basophils Absolute: 0 10*3/uL (ref 0.0–0.2)
EOS (ABSOLUTE): 0.3 10*3/uL (ref 0.0–0.4)
Eos: 3 %
HEMATOCRIT: 36.4 % (ref 34.0–46.6)
HEMOGLOBIN: 11.7 g/dL (ref 11.1–15.9)
HIV Screen 4th Generation wRfx: NONREACTIVE
Hepatitis B Surface Ag: NEGATIVE
IMMATURE GRANS (ABS): 0 10*3/uL (ref 0.0–0.1)
IMMATURE GRANULOCYTES: 0 %
LYMPHS: 25 %
Lymphocytes Absolute: 2.2 10*3/uL (ref 0.7–3.1)
MCH: 28.7 pg (ref 26.6–33.0)
MCHC: 32.1 g/dL (ref 31.5–35.7)
MCV: 89 fL (ref 79–97)
MONOCYTES: 7 %
Monocytes Absolute: 0.6 10*3/uL (ref 0.1–0.9)
NEUTROS PCT: 65 %
Neutrophils Absolute: 5.7 10*3/uL (ref 1.4–7.0)
Platelets: 272 10*3/uL (ref 150–379)
RBC: 4.07 x10E6/uL (ref 3.77–5.28)
RDW: 14.5 % (ref 12.3–15.4)
RPR Ser Ql: NONREACTIVE
RUBELLA: 1.38 {index} (ref >0.99)
Rh Factor: POSITIVE
WBC: 8.8 10*3/uL (ref 3.4–10.8)

## 2017-10-29 LAB — PMP SCREEN PROFILE (10S), URINE
AMPHETAMINE SCREEN URINE: NEGATIVE ng/mL
BARBITURATE SCREEN URINE: NEGATIVE ng/mL
BENZODIAZEPINE SCREEN, URINE: NEGATIVE ng/mL
CANNABINOIDS UR QL SCN: NEGATIVE ng/mL
COCAINE(METAB.)SCREEN, URINE: NEGATIVE ng/mL
Creatinine(Crt), U: 18.8 mg/dL — ABNORMAL LOW (ref 20.0–300.0)
METHADONE SCREEN, URINE: NEGATIVE ng/mL
OXYCODONE+OXYMORPHONE UR QL SCN: NEGATIVE ng/mL
Opiate Scrn, Ur: NEGATIVE ng/mL
PHENCYCLIDINE QUANTITATIVE URINE: NEGATIVE ng/mL
PROPOXYPHENE SCREEN URINE: NEGATIVE ng/mL
Ph of Urine: 7.2 (ref 4.5–8.9)

## 2017-10-29 LAB — VARICELLA ZOSTER ANTIBODY, IGG: VARICELLA: 392 {index} (ref >165)

## 2017-10-29 LAB — SPECIFIC GRAVITY (REFLEXED): SPECIFIC GRAVITY: 1.0028

## 2017-10-30 LAB — URINE CULTURE: Organism ID, Bacteria: NO GROWTH

## 2017-11-19 ENCOUNTER — Encounter: Payer: Self-pay | Admitting: Advanced Practice Midwife

## 2017-11-19 ENCOUNTER — Ambulatory Visit: Payer: Medicaid Other

## 2017-11-19 ENCOUNTER — Ambulatory Visit (INDEPENDENT_AMBULATORY_CARE_PROVIDER_SITE_OTHER): Payer: Medicaid Other | Admitting: Advanced Practice Midwife

## 2017-11-19 VITALS — BP 108/60 | HR 95 | Wt 213.0 lb

## 2017-11-19 DIAGNOSIS — Z1389 Encounter for screening for other disorder: Secondary | ICD-10-CM

## 2017-11-19 DIAGNOSIS — Z331 Pregnant state, incidental: Secondary | ICD-10-CM

## 2017-11-19 DIAGNOSIS — Z3A17 17 weeks gestation of pregnancy: Secondary | ICD-10-CM

## 2017-11-19 DIAGNOSIS — Z3482 Encounter for supervision of other normal pregnancy, second trimester: Secondary | ICD-10-CM

## 2017-11-19 DIAGNOSIS — Z363 Encounter for antenatal screening for malformations: Secondary | ICD-10-CM

## 2017-11-19 LAB — POCT URINALYSIS DIPSTICK
Blood, UA: NEGATIVE
GLUCOSE UA: NEGATIVE
Ketones, UA: NEGATIVE
LEUKOCYTES UA: NEGATIVE
Nitrite, UA: NEGATIVE
Protein, UA: NEGATIVE

## 2017-11-19 NOTE — Progress Notes (Addendum)
[id number] 9896w3d Estimated Date of Delivery: 04/26/18  Blood pressure 108/60, pulse 95, weight 213 lb (96.6 kg), last menstrual period 08/19/2017.   BP weight and urine results all reviewed and noted.  Please refer to the obstetrical flow sheet for the fundal height and fetal heart rate documentation:  Patient reports good fetal movement, denies any bleeding and no rupture of membranes symptoms or regular contractions. Patient is without complaints. All questions were answered.  FOB is cheating (had GC, blamed it on her, but found out he's gotten another woman pregnant who just had her 1st baby by him a few months ago).    Doesn't want AFP today, may get it next visit  Orders Placed This Encounter  Procedures  . US OB Comp + 14 Wk  . POCT urinalysis dipstick    Plan:  Continued routine obstetrical care,    Return in about 3 weeks (around 12/10/2017) for LROB, GE:[id number]:Anatomy.

## 2017-12-01 NOTE — L&D Delivery Note (Signed)
Delivery Note At 10:50 AM a viable female was delivered via VBAC, Spontaneous vaginal delivery (Presentation:direct OA ).  APGAR: 9, 9; weight 7 lb 11.1 oz (3490 g).  Placenta status:retained  Cord: three vessels with the following complications: cord avulsion.  Cord pH: N/A  Dr. Nira Retort called to bedside to asisst with manual exraction of placenta. Dr. Earlene Plater also present, able to manually extract placenta in delivery room.  Antibiotics: Additional dose of Ancef 2g for manual extraction Anesthesia:  epidural Episiotomy: None Lacerations: Paracervical, hemostatic,not repaired Suture Repair: N/A Est. Blood Loss (mL): 150  Mom to postpartum.  Baby to Couplet care / Skin to Skin.  Calvert Cantor, CNM 04/29/2018, 11:21 AM

## 2017-12-10 ENCOUNTER — Other Ambulatory Visit: Payer: Medicaid Other

## 2017-12-10 ENCOUNTER — Encounter: Payer: Medicaid Other | Admitting: Women's Health

## 2017-12-22 ENCOUNTER — Ambulatory Visit (INDEPENDENT_AMBULATORY_CARE_PROVIDER_SITE_OTHER): Payer: Medicaid Other

## 2017-12-22 ENCOUNTER — Other Ambulatory Visit: Payer: Self-pay

## 2017-12-22 ENCOUNTER — Ambulatory Visit (INDEPENDENT_AMBULATORY_CARE_PROVIDER_SITE_OTHER): Payer: Medicaid Other | Admitting: Women's Health

## 2017-12-22 ENCOUNTER — Encounter: Payer: Self-pay | Admitting: Women's Health

## 2017-12-22 VITALS — BP 110/64 | HR 99 | Wt 218.0 lb

## 2017-12-22 DIAGNOSIS — Z98891 History of uterine scar from previous surgery: Secondary | ICD-10-CM

## 2017-12-22 DIAGNOSIS — Z3402 Encounter for supervision of normal first pregnancy, second trimester: Secondary | ICD-10-CM

## 2017-12-22 DIAGNOSIS — O99332 Smoking (tobacco) complicating pregnancy, second trimester: Secondary | ICD-10-CM

## 2017-12-22 DIAGNOSIS — Z363 Encounter for antenatal screening for malformations: Secondary | ICD-10-CM | POA: Diagnosis not present

## 2017-12-22 DIAGNOSIS — Z3A22 22 weeks gestation of pregnancy: Secondary | ICD-10-CM

## 2017-12-22 DIAGNOSIS — Z3482 Encounter for supervision of other normal pregnancy, second trimester: Secondary | ICD-10-CM

## 2017-12-22 DIAGNOSIS — Z1389 Encounter for screening for other disorder: Secondary | ICD-10-CM

## 2017-12-22 DIAGNOSIS — Z331 Pregnant state, incidental: Secondary | ICD-10-CM

## 2017-12-22 DIAGNOSIS — O99322 Drug use complicating pregnancy, second trimester: Secondary | ICD-10-CM

## 2017-12-22 DIAGNOSIS — N898 Other specified noninflammatory disorders of vagina: Secondary | ICD-10-CM

## 2017-12-22 DIAGNOSIS — O34219 Maternal care for unspecified type scar from previous cesarean delivery: Secondary | ICD-10-CM

## 2017-12-22 DIAGNOSIS — O26892 Other specified pregnancy related conditions, second trimester: Secondary | ICD-10-CM

## 2017-12-22 LAB — POCT URINALYSIS DIPSTICK
Blood, UA: NEGATIVE
GLUCOSE UA: NEGATIVE
KETONES UA: NEGATIVE
Leukocytes, UA: NEGATIVE
Nitrite, UA: NEGATIVE

## 2017-12-22 LAB — POCT WET PREP (WET MOUNT)
Clue Cells Wet Prep Whiff POC: NEGATIVE
Trichomonas Wet Prep HPF POC: ABSENT

## 2017-12-22 NOTE — Patient Instructions (Addendum)
Debbie Mueller, I greatly value your feedback.  If you receive a survey following your visit with [place] today, we appreciate you taking the time to fill it out.  Thanks, [name], CNM, WHNP-BC   You will have your sugar test next visit.  Please do not eat or drink anything after midnight the night before you come, not even water.  You will be here for at least two hours.     You can try a 7 night over the counter yeast cream such as Monistat 7. It does not need to be brand name, generic is fine, but please make sure it is a 7 night cream.    Call the office [phone]) or go to [facility] if:  You begin to have strong, frequent contractions  Your water breaks.  Sometimes it is a big gush of fluid, sometimes it is just a trickle that keeps getting your panties wet or running down your legs  You have vaginal bleeding.  It is normal to have a small amount of spotting if your cervix was checked.   You don't feel your baby moving like normal.  If you don't, get you something to eat and drink and lay down and focus on feeling your baby move.   If your baby is still not moving like normal, you should call the office or go to [facility].  Second Trimester of Pregnancy The second trimester is from week 13 through week 28, months 4 through 6. The second trimester is often a time when you feel your best. Your body has also adjusted to being pregnant, and you begin to feel better physically. Usually, morning sickness has lessened or quit completely, you may have more energy, and you may have an increase in appetite. The second trimester is also a time when the fetus is growing rapidly. At the end of the sixth month, the fetus is about 9 inches long and weighs about 1 pounds. You will likely begin to feel the baby move (quickening) between 18 and 20 weeks of the pregnancy. BODY CHANGES Your body goes through many changes during pregnancy. The changes vary from woman to woman.   Your weight will  continue to increase. You will notice your lower abdomen bulging out.  You may begin to get stretch marks on your hips, abdomen, and breasts.  You may develop headaches that can be relieved by medicines approved by your health care provider.  You may urinate more often because the fetus is pressing on your bladder.  You may develop or continue to have heartburn as a result of your pregnancy.  You may develop constipation because certain hormones are causing the muscles that push waste through your intestines to slow down.  You may develop hemorrhoids or swollen, bulging veins (varicose veins).  You may have back pain because of the weight gain and pregnancy hormones relaxing your joints between the bones in your pelvis and as a result of a shift in weight and the muscles that support your balance.  Your breasts will continue to grow and be tender.  Your gums may bleed and may be sensitive to brushing and flossing.  Dark spots or blotches (chloasma, mask of pregnancy) may develop on your face. This will likely fade after the baby is born.  A dark line from your belly button to the pubic area (linea nigra) may appear. This will likely fade after the baby is born.  You may have changes in your hair. These  can include thickening of your hair, rapid growth, and changes in texture. Some women also have hair loss during or after pregnancy, or hair that feels dry or thin. Your hair will most likely return to normal after your baby is born. WHAT TO EXPECT AT YOUR PRENATAL VISITS During a routine prenatal visit:  You will be weighed to make sure you and the fetus are growing normally.  Your blood pressure will be taken.  Your abdomen will be measured to track your baby's growth.  The fetal heartbeat will be listened to.  Any test results from the previous visit will be discussed. Your health care provider may ask you:  How you are feeling.  If you are feeling the baby move.  If you  have had any abnormal symptoms, such as leaking fluid, bleeding, severe headaches, or abdominal cramping.  If you have any questions. Other tests that may be performed during your second trimester include:  Blood tests that check for:  Low iron levels (anemia).  Gestational diabetes (between 24 and 28 weeks).  Rh antibodies.  Urine tests to check for infections, diabetes, or protein in the urine.  An ultrasound to confirm the proper growth and development of the baby.  An amniocentesis to check for possible genetic problems.  Fetal screens for spina bifida and Down syndrome. HOME CARE INSTRUCTIONS   Avoid all smoking, herbs, alcohol, and unprescribed drugs. These chemicals affect the formation and growth of the baby.  Follow your health care provider's instructions regarding medicine use. There are medicines that are either safe or unsafe to take during pregnancy.  Exercise only as directed by your health care provider. Experiencing uterine cramps is a good sign to stop exercising.  Continue to eat regular, healthy meals.  Wear a good support bra for breast tenderness.  Do not use hot tubs, steam rooms, or saunas.  Wear your seat belt at all times when driving.  Avoid raw meat, uncooked cheese, cat litter boxes, and soil used by cats. These carry germs that can cause birth defects in the baby.  Take your prenatal vitamins.  Try taking a stool softener (if your health care provider approves) if you develop constipation. Eat more high-fiber foods, such as fresh vegetables or fruit and whole grains. Drink plenty of fluids to keep your urine clear or pale yellow.  Take warm sitz baths to soothe any pain or discomfort caused by hemorrhoids. Use hemorrhoid cream if your health care provider approves.  If you develop varicose veins, wear support hose. Elevate your feet for 15 minutes, 3-4 times a day. Limit salt in your diet.  Avoid heavy lifting, wear low heel shoes, and  practice good posture.  Rest with your legs elevated if you have leg cramps or low back pain.  Visit your dentist if you have not gone yet during your pregnancy. Use a soft toothbrush to brush your teeth and be gentle when you floss.  A sexual relationship may be continued unless your health care provider directs you otherwise.  Continue to go to all your prenatal visits as directed by your health care provider. SEEK MEDICAL CARE IF:   You have dizziness.  You have mild pelvic cramps, pelvic pressure, or nagging pain in the abdominal area.  You have persistent nausea, vomiting, or diarrhea.  You have a bad smelling vaginal discharge.  You have pain with urination. SEEK IMMEDIATE MEDICAL CARE IF:   You have a fever.  You are leaking fluid from your vagina.  You have spotting or bleeding from your vagina.  You have severe abdominal cramping or pain.  You have rapid weight gain or loss.  You have shortness of breath with chest pain.  You notice sudden or extreme swelling of your face, hands, ankles, feet, or legs.  You have not felt your baby move in over an hour.  You have severe headaches that do not go away with medicine.  You have vision changes. Document Released: 11/11/2001 Document Revised: 11/22/2013 Document Reviewed: 01/18/2013 [facility] Patient Information 2015 [place], [place]. This information is not intended to replace advice given to you by your health care provider. Make sure you discuss any questions you have with your health care provider.

## 2017-12-22 NOTE — Progress Notes (Signed)
LOW-RISK PREGNANCY VISIT Patient name: Debbie Mueller MRN [id number]  Date of birth: [birth year] Chief Complaint:   Routine Prenatal Visit (u/s today; c/o white d/c with odor)  History of Present Illness:   Debbie Mueller is a 37 y.o. 643P2002 female at 818w1d with an Estimated Date of Delivery: 04/26/18 being seen today for ongoing management of a low-risk pregnancy.  Today she reports white d/c with some itching x 2wks, thinks she has yeast infection.Pretty sure she wants TOLAC.  Contractions: Not present. Vag. Bleeding: None.  Movement: Present. denies leaking of fluid. Review of Systems:   Pertinent items are noted in HPI Denies abnormal vaginal discharge w/ itching/odor/irritation, headaches, visual changes, shortness of breath, chest pain, abdominal pain, severe nausea/vomiting, or problems with urination or bowel movements unless otherwise stated above. Pertinent History Reviewed:  Reviewed past medical,surgical, social, obstetrical and family history.  Reviewed problem list, medications and allergies. Physical Assessment:   Vitals:   12/22/17 1004  BP: 110/64  Pulse: 99  Weight: 218 lb (98.9 kg)  Body mass index is 39.55 kg/m.        Physical Examination:   General appearance: Well appearing, and in no distress  Mental status: Alert, oriented to person, place, and time  Skin: Warm & dry  Cardiovascular: Normal heart rate noted  Respiratory: Normal respiratory effort, no distress  Abdomen: Soft, gravid, nontender  Pelvic: spec exam: cx visually closed, mod amt white nonodorous d/c         Extremities: Edema: None  Fetal Status: Fetal Heart Rate (bpm): 157 u/s   Movement: Present    US 22+1 wks,breech,anterior pl gr 0 w/a posterior accessory lobe,cx 5.5 cm,normal ovaries bilat,svp of fluid 5.3 cm,fhr 157 bpm,efw 482 g 39 %,dolichocephalic shaped head,BPD 7.8%,anatomy complete   Results for orders placed or performed in visit on 12/22/17 (from the past 24 hour(s))  POCT  urinalysis dipstick   Collection Time: 12/22/17 10:07 AM  Result Value Ref Range   Color, UA     Clarity, UA     Glucose, UA neg    Bilirubin, UA     Ketones, UA neg    Spec Grav, UA  1.010 - 1.025   Blood, UA neg    pH, UA  5.0 - 8.0   Protein, UA trace    Urobilinogen, UA  0.2 or 1.0 E.U./dL   Nitrite, UA neg    Leukocytes, UA Negative Negative   Appearance     Odor    POCT Wet Prep Mellody Drown(Wet [place])   Collection Time: 12/22/17 10:38 AM  Result Value Ref Range   Source Wet Prep POC vaginal    WBC, Wet Prep HPF POC few    Bacteria Wet Prep HPF POC None (A) Few   BACTERIA WET PREP MORPHOLOGY POC     Clue Cells Wet Prep HPF POC None None   Clue Cells Wet Prep Whiff POC Negative Whiff    Yeast Wet Prep HPF POC Few    KOH Wet Prep POC     Trichomonas Wet Prep HPF POC Absent Absent    Assessment & Plan:  1) Low-risk pregnancy G3P2002 at 6318w1d with an Estimated Date of Delivery: 04/26/18   2) Mild vulvovaginal candida, otc monistat 7  3) Prev NSVB then PLTCS for breech> wants TOLAC, gave consent to take home and review  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  Labs/procedures today: anatomy u/s  Plan:  Continue routine obstetrical care   Reviewed: Preterm labor symptoms and general obstetric precautions including but not limited to vaginal bleeding, contractions, leaking of fluid and fetal movement were reviewed in detail with the patient.  All questions were answered  Follow-up: Return in about 4 weeks (around 01/19/2018) for LROB, PN2.  Orders Placed This Encounter  Procedures  . POCT urinalysis dipstick  . POCT [employer] [facility])   Marge Duncans CNM, [facility] 12/22/2017 10:39 AM

## 2017-12-22 NOTE — Progress Notes (Signed)
US 22+1 wks,breech,anterior pl gr 0 w/a posterior accessory lobe,cx 5.5 cm,normal ovaries bilat,svp of fluid 5.3 cm,fhr 157 bpm,efw 482 g 39 %,dolichocephalic shaped head,BPD 7.8%,anatomy complete

## 2018-01-19 ENCOUNTER — Encounter: Payer: Self-pay | Admitting: Women's Health

## 2018-01-19 ENCOUNTER — Ambulatory Visit (INDEPENDENT_AMBULATORY_CARE_PROVIDER_SITE_OTHER): Payer: Medicaid Other | Admitting: Women's Health

## 2018-01-19 ENCOUNTER — Other Ambulatory Visit: Payer: Medicaid Other

## 2018-01-19 VITALS — BP 110/70 | HR 103 | Wt 221.0 lb

## 2018-01-19 DIAGNOSIS — Z131 Encounter for screening for diabetes mellitus: Secondary | ICD-10-CM

## 2018-01-19 DIAGNOSIS — Z3482 Encounter for supervision of other normal pregnancy, second trimester: Secondary | ICD-10-CM

## 2018-01-19 DIAGNOSIS — Z331 Pregnant state, incidental: Secondary | ICD-10-CM

## 2018-01-19 DIAGNOSIS — Z1389 Encounter for screening for other disorder: Secondary | ICD-10-CM

## 2018-01-19 DIAGNOSIS — Z3A26 26 weeks gestation of pregnancy: Secondary | ICD-10-CM

## 2018-01-19 LAB — POCT URINALYSIS DIPSTICK
Glucose, UA: NEGATIVE
Ketones, UA: NEGATIVE
LEUKOCYTES UA: NEGATIVE
NITRITE UA: NEGATIVE

## 2018-01-19 NOTE — Patient Instructions (Signed)
Debbie NajjarHeather N Ceniceros, I greatly value your feedback.  If you receive a survey following your visit with us today, we appreciate you taking the time to fill it out.  Thanks, Joellyn HaffKim Jafar Poffenberger, CNM, WHNP-BC   Call the office [phone]) or go to [facility] if:  You begin to have strong, frequent contractions  Your water breaks.  Sometimes it is a big gush of fluid, sometimes it is just a trickle that keeps getting your panties wet or running down your legs  You have vaginal bleeding.  It is normal to have a small amount of spotting if your cervix was checked.   You don't feel your baby moving like normal.  If you don't, get you something to eat and drink and lay down and focus on feeling your baby move.  You should feel at least 10 movements in 2 hours.  If you don't, you should call the office or [facility].    Tdap Vaccine  It is recommended that you get the Tdap vaccine during the third trimester of EACH pregnancy to help protect your baby from getting pertussis (whooping cough)  27-36 weeks is the BEST time to do this so that you can pass the protection on to your baby. During pregnancy is better than after pregnancy, but if you are unable to get it during pregnancy it will be offered at the hospital.   You can get this vaccine at the health department or your family doctor  Everyone who will be around your baby should also be up-to-date on their vaccines. Adults (who are not pregnant) only need 1 dose of Tdap during adulthood.   Third Trimester of Pregnancy The third trimester is from week 29 through week 42, months 7 through 9. The third trimester is a time when the fetus is growing rapidly. At the end of the ninth month, the fetus is about 20 inches in length and weighs 6-10 pounds.  BODY CHANGES Your body goes through many changes during pregnancy. The changes vary from woman to woman.   Your weight will continue to increase. You can expect to gain 25-35 pounds (11-16 kg) by  the end of the pregnancy.  You may begin to get stretch marks on your hips, abdomen, and breasts.  You may urinate more often because the fetus is moving lower into your pelvis and pressing on your bladder.  You may develop or continue to have heartburn as a result of your pregnancy.  You may develop constipation because certain hormones are causing the muscles that push waste through your intestines to slow down.  You may develop hemorrhoids or swollen, bulging veins (varicose veins).  You may have pelvic pain because of the weight gain and pregnancy hormones relaxing your joints between the bones in your pelvis. Backaches may result from overexertion of the muscles supporting your posture.  You may have changes in your hair. These can include thickening of your hair, rapid growth, and changes in texture. Some women also have hair loss during or after pregnancy, or hair that feels dry or thin. Your hair will most likely return to normal after your baby is born.  Your breasts will continue to grow and be tender. A yellow discharge may leak from your breasts called colostrum.  Your belly button may stick out.  You may feel short of breath because of your expanding uterus.  You may notice the fetus "dropping," or moving lower in your abdomen.  You may have a bloody  mucus discharge. This usually occurs a few days to a week before labor begins.  Your cervix becomes thin and soft (effaced) near your due date. WHAT TO EXPECT AT YOUR PRENATAL EXAMS  You will have prenatal exams every 2 weeks until week 36. Then, you will have weekly prenatal exams. During a routine prenatal visit:  You will be weighed to make sure you and the fetus are growing normally.  Your blood pressure is taken.  Your abdomen will be measured to track your baby's growth.  The fetal heartbeat will be listened to.  Any test results from the previous visit will be discussed.  You may have a cervical check near your  due date to see if you have effaced. At around 36 weeks, your caregiver will check your cervix. At the same time, your caregiver will also perform a test on the secretions of the vaginal tissue. This test is to determine if a type of bacteria, Group B streptococcus, is present. Your caregiver will explain this further. Your caregiver may ask you:  What your birth plan is.  How you are feeling.  If you are feeling the baby move.  If you have had any abnormal symptoms, such as leaking fluid, bleeding, severe headaches, or abdominal cramping.  If you have any questions. Other tests or screenings that may be performed during your third trimester include:  Blood tests that check for low iron levels (anemia).  Fetal testing to check the health, activity level, and growth of the fetus. Testing is done if you have certain medical conditions or if there are problems during the pregnancy. FALSE LABOR You may feel small, irregular contractions that eventually go away. These are called Braxton Hicks contractions, or false labor. Contractions may last for hours, days, or even weeks before true labor sets in. If contractions come at regular intervals, intensify, or become painful, it is best to be seen by your caregiver.  SIGNS OF LABOR   Menstrual-like cramps.  Contractions that are 5 minutes apart or less.  Contractions that start on the top of the uterus and spread down to the lower abdomen and back.  A sense of increased pelvic pressure or back pain.  A watery or bloody mucus discharge that comes from the vagina. If you have any of these signs before the 37th week of pregnancy, call your caregiver right away. You need to go to the hospital to get checked immediately. HOME CARE INSTRUCTIONS   Avoid all smoking, herbs, alcohol, and unprescribed drugs. These chemicals affect the formation and growth of the baby.  Follow your caregiver's instructions regarding medicine use. There are medicines  that are either safe or unsafe to take during pregnancy.  Exercise only as directed by your caregiver. Experiencing uterine cramps is a good sign to stop exercising.  Continue to eat regular, healthy meals.  Wear a good support bra for breast tenderness.  Do not use hot tubs, steam rooms, or saunas.  Wear your seat belt at all times when driving.  Avoid raw meat, uncooked cheese, cat litter boxes, and soil used by cats. These carry germs that can cause birth defects in the baby.  Take your prenatal vitamins.  Try taking a stool softener (if your caregiver approves) if you develop constipation. Eat more high-fiber foods, such as fresh vegetables or fruit and whole grains. Drink plenty of fluids to keep your urine clear or pale yellow.  Take warm sitz baths to soothe any pain or discomfort caused by hemorrhoids.  Use hemorrhoid cream if your caregiver approves.  If you develop varicose veins, wear support hose. Elevate your feet for 15 minutes, 3-4 times a day. Limit salt in your diet.  Avoid heavy lifting, wear low heal shoes, and practice good posture.  Rest a lot with your legs elevated if you have leg cramps or low back pain.  Visit your dentist if you have not gone during your pregnancy. Use a soft toothbrush to brush your teeth and be gentle when you floss.  A sexual relationship may be continued unless your caregiver directs you otherwise.  Do not travel far distances unless it is absolutely necessary and only with the approval of your caregiver.  Take prenatal classes to understand, practice, and ask questions about the labor and delivery.  Make a trial run to the hospital.  Pack your hospital bag.  Prepare the baby's nursery.  Continue to go to all your prenatal visits as directed by your caregiver. SEEK MEDICAL CARE IF:  You are unsure if you are in labor or if your water has broken.  You have dizziness.  You have mild pelvic cramps, pelvic pressure, or nagging  pain in your abdominal area.  You have persistent nausea, vomiting, or diarrhea.  You have a bad smelling vaginal discharge.  You have pain with urination. SEEK IMMEDIATE MEDICAL CARE IF:   You have a fever.  You are leaking fluid from your vagina.  You have spotting or bleeding from your vagina.  You have severe abdominal cramping or pain.  You have rapid weight loss or gain.  You have shortness of breath with chest pain.  You notice sudden or extreme swelling of your face, hands, ankles, feet, or legs.  You have not felt your baby move in over an hour.  You have severe headaches that do not go away with medicine.  You have vision changes. Document Released: 11/11/2001 Document Revised: 11/22/2013 Document Reviewed: 01/18/2013 [employer] Patient Information 2015 [employer]. This information is not intended to replace advice given to you by your health care provider. Make sure you discuss any questions you have with your health care provider.   

## 2018-01-19 NOTE — Progress Notes (Signed)
   LOW-RISK PREGNANCY VISIT Patient name: Debbie Mueller MRN [id number]  Date of birth: 1981/09/27 Chief Complaint:   Routine Prenatal Visit (PN2 today; + cramping)  History of Present Illness:   Debbie NajjarHeather N Mueller is a 37 y.o. 733P2002 female at 5567w1d with an Estimated Date of Delivery: 04/26/18 being seen today for ongoing management of a low-risk pregnancy.  Today she reports some cramping. Contractions: Not present. Vag. Bleeding: None.  Movement: Present. denies leaking of fluid. Review of Systems:   Pertinent items are noted in HPI Denies abnormal vaginal discharge w/ itching/odor/irritation, headaches, visual changes, shortness of breath, chest pain, abdominal pain, severe nausea/vomiting, or problems with urination or bowel movements unless otherwise stated above. Pertinent History Reviewed:  Reviewed past medical,surgical, social, obstetrical and family history.  Reviewed problem list, medications and allergies. Physical Assessment:   Vitals:   01/19/18 0923  BP: 110/70  Pulse: (!) 103  Weight: 221 lb (100.2 kg)  Body mass index is 40.1 kg/m.        Physical Examination:   General appearance: Well appearing, and in no distress  Mental status: Alert, oriented to person, place, and time  Skin: Warm & dry  Cardiovascular: Normal heart rate noted  Respiratory: Normal respiratory effort, no distress  Abdomen: Soft, gravid, nontender  Pelvic: Cervical exam deferred         Extremities: Edema: Trace  Fetal Status: Fetal Heart Rate (bpm): 159 Fundal Height: 29 cm Movement: Present    Results for orders placed or performed in visit on 01/19/18 (from the past 24 hour(s))  POCT urinalysis dipstick   Collection Time: 01/19/18  9:24 AM  Result Value Ref Range   Color, UA     Clarity, UA     Glucose, UA neg    Bilirubin, UA     Ketones, UA neg    Spec Grav, UA  1.010 - 1.025   Blood, UA trace    pH, UA  5.0 - 8.0   Protein, UA trace    Urobilinogen, UA  0.2 or 1.0 E.U./dL   Nitrite, UA neg    Leukocytes, UA Negative Negative   Appearance     Odor      Assessment & Plan:  1) Low-risk pregnancy G3P2002 at 5767w1d with an Estimated Date of Delivery: 04/26/18    Meds: No orders of the defined types were placed in this encounter.  Labs/procedures today: pn2  Plan:  Continue routine obstetrical care   Reviewed: Preterm labor symptoms and general obstetric precautions including but not limited to vaginal bleeding, contractions, leaking of fluid and fetal movement were reviewed in detail with the patient. Recommended Tdap at HD/PCP per CDC guidelines.  All questions were answered  Follow-up: Return in about 4 weeks (around 02/16/2018) for LROB.  Orders Placed This Encounter  Procedures  . POCT urinalysis dipstick   Cheral MarkerKimberly R Booker CNM, [facility] 01/19/2018 9:34 AM

## 2018-01-25 LAB — CBC
Hematocrit: 33.8 % — ABNORMAL LOW (ref 34.0–46.6)
Hemoglobin: 11.3 g/dL (ref 11.1–15.9)
MCH: 30.3 pg (ref 26.6–33.0)
MCHC: 33.4 g/dL (ref 31.5–35.7)
MCV: 91 fL (ref 79–97)
PLATELETS: 301 10*3/uL (ref 150–379)
RBC: 3.73 x10E6/uL — ABNORMAL LOW (ref 3.77–5.28)
RDW: 13.7 % (ref 12.3–15.4)
WBC: 12.5 10*3/uL — ABNORMAL HIGH (ref 3.4–10.8)

## 2018-01-25 LAB — GLUCOSE TOLERANCE, 2 HOURS W/ 1HR
Glucose, 1 hour: 109 mg/dL (ref 65–179)
Glucose, 2 hour: 133 mg/dL (ref 65–152)
Glucose, Fasting: 79 mg/dL (ref 65–91)

## 2018-01-25 LAB — RPR: RPR Ser Ql: NONREACTIVE

## 2018-01-25 LAB — ANTIBODY SCREEN: Antibody Screen: NEGATIVE

## 2018-01-25 LAB — HIV ANTIBODY (ROUTINE TESTING W REFLEX): HIV Screen 4th Generation wRfx: NONREACTIVE

## 2018-02-16 ENCOUNTER — Encounter: Payer: Self-pay | Admitting: Obstetrics & Gynecology

## 2018-02-16 ENCOUNTER — Ambulatory Visit (INDEPENDENT_AMBULATORY_CARE_PROVIDER_SITE_OTHER): Payer: Medicaid Other | Admitting: Obstetrics & Gynecology

## 2018-02-16 VITALS — BP 114/66 | HR 98 | Wt 223.0 lb

## 2018-02-16 DIAGNOSIS — Z331 Pregnant state, incidental: Secondary | ICD-10-CM

## 2018-02-16 DIAGNOSIS — Z3A30 30 weeks gestation of pregnancy: Secondary | ICD-10-CM

## 2018-02-16 DIAGNOSIS — Z1389 Encounter for screening for other disorder: Secondary | ICD-10-CM

## 2018-02-16 DIAGNOSIS — Z3483 Encounter for supervision of other normal pregnancy, third trimester: Secondary | ICD-10-CM

## 2018-02-16 LAB — POCT URINALYSIS DIPSTICK
GLUCOSE UA: NEGATIVE
Ketones, UA: NEGATIVE
LEUKOCYTES UA: NEGATIVE
Nitrite, UA: NEGATIVE
Protein, UA: NEGATIVE
RBC UA: NEGATIVE

## 2018-02-16 NOTE — Progress Notes (Signed)
[id number] 924w1d Estimated Date of Delivery: 04/26/18  Blood pressure 114/66, pulse 98, weight 223 lb (101.2 kg), last menstrual period 08/19/2017.   BP weight and urine results all reviewed and noted.  Please refer to the obstetrical flow sheet for the fundal height and fetal heart rate documentation:  Patient reports good fetal movement, denies any bleeding and no rupture of membranes symptoms or regular contractions. Patient is without complaints. All questions were answered.  Orders Placed This Encounter  Procedures  . POCT Urinalysis Dipstick    Plan:  Continued routine obstetrical care,   Return in about 3 weeks (around 03/09/2018) for LROB.

## 2018-03-09 ENCOUNTER — Ambulatory Visit (INDEPENDENT_AMBULATORY_CARE_PROVIDER_SITE_OTHER): Payer: Medicaid Other | Admitting: Advanced Practice Midwife

## 2018-03-09 ENCOUNTER — Encounter: Payer: Self-pay | Admitting: Advanced Practice Midwife

## 2018-03-09 VITALS — BP 118/60 | HR 115 | Wt 225.4 lb

## 2018-03-09 DIAGNOSIS — Z3483 Encounter for supervision of other normal pregnancy, third trimester: Secondary | ICD-10-CM

## 2018-03-09 DIAGNOSIS — Z3A33 33 weeks gestation of pregnancy: Secondary | ICD-10-CM

## 2018-03-09 DIAGNOSIS — Z1389 Encounter for screening for other disorder: Secondary | ICD-10-CM

## 2018-03-09 DIAGNOSIS — Z331 Pregnant state, incidental: Secondary | ICD-10-CM

## 2018-03-09 LAB — POCT URINALYSIS DIPSTICK
GLUCOSE UA: NEGATIVE
KETONES UA: NEGATIVE
Leukocytes, UA: NEGATIVE
Nitrite, UA: NEGATIVE
RBC UA: NEGATIVE

## 2018-03-09 NOTE — Progress Notes (Addendum)
  Z6X0960G3P2002 4341w1d Estimated Date of Delivery: 04/26/18  Blood pressure 118/60, pulse (!) 115, weight 225 lb 6.4 oz (102.2 kg), last menstrual period 08/19/2017.   BP weight and urine results all reviewed and noted.  Please refer to the obstetrical flow sheet for the fundal height and fetal heart rate documentation:  Patient reports good fetal movement, denies any bleeding and no rupture of membranes symptoms or regular contractions. Patient has a mole on upper abdomen that recently drained pus and now looks like a pimple. Still draining, will let heal then provably remove since it changed.  All questions were answered. TOLAC and BTL consents signed  Physical Assessment:   Vitals:   03/09/18 1054  BP: 118/60  Pulse: (!) 115  Weight: 225 lb 6.4 oz (102.2 kg)  Body mass index is 40.9 kg/m.        Physical Examination:   General appearance: Well appearing, and in no distress  Mental status: Alert, oriented to person, place, and time  Skin: Warm & dry  Cardiovascular: Normal heart rate noted  Respiratory: Normal respiratory effort, no distress  Abdomen: Soft, gravid, nontender  Pelvic: Cervical exam deferred         Extremities: Edema: Trace  Fetal Status: Fetal Heart Rate (bpm): 149 Fundal Height: 34 cm Movement: Present    Results for orders placed or performed in visit on 03/09/18 (from the past 24 hour(s))  POCT urinalysis dipstick   Collection Time: 03/09/18 10:49 AM  Result Value Ref Range   Color, UA     Clarity, UA     Glucose, UA neg    Bilirubin, UA     Ketones, UA neg    Spec Grav, UA  1.010 - 1.025   Blood, UA neg    pH, UA  5.0 - 8.0   Protein, UA trace    Urobilinogen, UA  0.2 or 1.0 E.U./dL   Nitrite, UA neg    Leukocytes, UA Negative Negative   Appearance     Odor       Orders Placed This Encounter  Procedures  . POCT urinalysis dipstick    Plan:  Continued routine obstetrical care,   Return in about 2 weeks (around 03/23/2018) for LROB.

## 2018-03-15 ENCOUNTER — Telehealth: Payer: Self-pay | Admitting: *Deleted

## 2018-03-15 NOTE — Telephone Encounter (Signed)
Patient states she is having swelling in her hands and feet.  Not experiencing any headaches, blurred vision, spots, floaters, just swelling.  Informed patient that without any other symptoms, not too concerned about pre-e at this time.  Swelling is normal during pregnancy so advised to push fluids and elevate extremities when sitting.  Advised to call back if she starts experiencing any other symptoms.  Verbalized understanding.

## 2018-03-23 ENCOUNTER — Encounter: Payer: Medicaid Other | Admitting: Obstetrics & Gynecology

## 2018-03-23 ENCOUNTER — Telehealth: Payer: Self-pay | Admitting: *Deleted

## 2018-03-23 NOTE — Telephone Encounter (Signed)
LMOVM returning patient's call.  

## 2018-03-23 NOTE — Telephone Encounter (Signed)
Patient states she has had swelling in her hands and feet and a hard time sleeping at night.  She is not experiencing any headaches or vision changes.  Informed patient that swelling can be a normal part of pregnancy and since she is not having any other symptoms, to continue to monitor and let us know if she does.  Advised to keep scheduled appointment for Friday and to push fluids and elevate feet when not up.  Verbalized understanding.

## 2018-03-26 ENCOUNTER — Encounter: Payer: Self-pay | Admitting: Obstetrics and Gynecology

## 2018-03-26 ENCOUNTER — Ambulatory Visit (INDEPENDENT_AMBULATORY_CARE_PROVIDER_SITE_OTHER): Payer: Medicaid Other | Admitting: Obstetrics and Gynecology

## 2018-03-26 VITALS — BP 120/80 | HR 96 | Wt 226.4 lb

## 2018-03-26 DIAGNOSIS — Z1389 Encounter for screening for other disorder: Secondary | ICD-10-CM

## 2018-03-26 DIAGNOSIS — Z331 Pregnant state, incidental: Secondary | ICD-10-CM

## 2018-03-26 DIAGNOSIS — Z3A35 35 weeks gestation of pregnancy: Secondary | ICD-10-CM

## 2018-03-26 DIAGNOSIS — Z3483 Encounter for supervision of other normal pregnancy, third trimester: Secondary | ICD-10-CM

## 2018-03-26 LAB — POCT URINALYSIS DIPSTICK
KETONES UA: NEGATIVE
Leukocytes, UA: NEGATIVE
Nitrite, UA: NEGATIVE
RBC UA: NEGATIVE

## 2018-03-26 NOTE — Progress Notes (Signed)
[id number]  Estimated Date of Delivery: 04/26/18 LROB 5458w4d, prior cesarean desiring trial of labor  Chief Complaint  Patient presents with  . low risk ob    vaginal dischrge/ feet swelling at night  ____  Patient complaints: Patient noted a bit of discharge last week when she "thought she peed on herself" single episode, has not recurred since then. Patient reports   good fetal movement,                           denies any bleeding , rupture of membranes,or regular contractions.  Blood pressure 120/80, pulse 96, weight 226 lb 6.4 oz (102.7 kg), last menstrual period 08/19/2017.   Urine results:notable for trace protein refer to the ob flow sheet for FH and FHR, , fundal height 37, fetal heart rate 141                         Physical Examination: General appearance -moderate facial edema alert, well appearing, and in no distress                                      Abdomen - FH 37,                                                         -FHR 141                                                         soft, nontender, nondistended, no masses or organomegaly Gravid uterus consistent with dates                                      Pelvic - normal external genitalia, vulva, vagina, cervix, uterus and adnexa                                            Questions were answered. Assessment: LROB G3P2002 @ 5958w4d vaginal secretions no evidence membrane rupture  Plan:  Continued routine obstetrical care, CONFIRM VERTEX BY U/S IF NECESSARY NEXT WEEK  F/u in 1 weeks for LROB

## 2018-03-31 ENCOUNTER — Encounter: Payer: Medicaid Other | Admitting: Obstetrics and Gynecology

## 2018-03-31 ENCOUNTER — Other Ambulatory Visit: Payer: Medicaid Other

## 2018-04-05 ENCOUNTER — Other Ambulatory Visit: Payer: Self-pay | Admitting: Obstetrics and Gynecology

## 2018-04-05 DIAGNOSIS — O321XX0 Maternal care for breech presentation, not applicable or unspecified: Secondary | ICD-10-CM

## 2018-04-06 ENCOUNTER — Encounter: Payer: Self-pay | Admitting: Advanced Practice Midwife

## 2018-04-06 ENCOUNTER — Ambulatory Visit (INDEPENDENT_AMBULATORY_CARE_PROVIDER_SITE_OTHER): Payer: Medicaid Other | Admitting: Advanced Practice Midwife

## 2018-04-06 ENCOUNTER — Other Ambulatory Visit: Payer: Self-pay | Admitting: Advanced Practice Midwife

## 2018-04-06 ENCOUNTER — Ambulatory Visit (INDEPENDENT_AMBULATORY_CARE_PROVIDER_SITE_OTHER): Payer: Medicaid Other

## 2018-04-06 ENCOUNTER — Other Ambulatory Visit: Payer: Self-pay

## 2018-04-06 ENCOUNTER — Other Ambulatory Visit (HOSPITAL_COMMUNITY): Payer: Self-pay | Admitting: Advanced Practice Midwife

## 2018-04-06 VITALS — BP 118/76 | HR 95 | Wt 228.0 lb

## 2018-04-06 DIAGNOSIS — O321XX0 Maternal care for breech presentation, not applicable or unspecified: Secondary | ICD-10-CM | POA: Diagnosis not present

## 2018-04-06 DIAGNOSIS — Z3403 Encounter for supervision of normal first pregnancy, third trimester: Secondary | ICD-10-CM

## 2018-04-06 DIAGNOSIS — Z3A37 37 weeks gestation of pregnancy: Secondary | ICD-10-CM

## 2018-04-06 DIAGNOSIS — Z331 Pregnant state, incidental: Secondary | ICD-10-CM

## 2018-04-06 DIAGNOSIS — Z1389 Encounter for screening for other disorder: Secondary | ICD-10-CM

## 2018-04-06 DIAGNOSIS — Z3483 Encounter for supervision of other normal pregnancy, third trimester: Secondary | ICD-10-CM

## 2018-04-06 LAB — POCT URINALYSIS DIPSTICK
Glucose, UA: NEGATIVE
Ketones, UA: NEGATIVE
Leukocytes, UA: NEGATIVE
NITRITE UA: NEGATIVE
RBC UA: NEGATIVE

## 2018-04-06 LAB — OB RESULTS CONSOLE GBS: GBS: NEGATIVE

## 2018-04-06 NOTE — Progress Notes (Signed)
  [id number] [redacted]w[redacted]d Estimated Date of Delivery: 04/26/18  Blood pressure 118/76, pulse 95, weight 228 lb (103.4 kg), last menstrual period 08/19/2017.   BP weight and urine results all reviewed and noted.  Please refer to the obstetrical flow sheet for the fundal height and fetal heart rate documentation:  Patient reports good fetal movement, denies any bleeding and no rupture of membranes symptoms or regular contractions. Patient is without complaints. All questions were answered.   Physical Assessment:   Vitals:   04/06/18 1054  BP: 118/76  Pulse: 95  Weight: 228 lb (103.4 kg)  Body mass index is 41.37 kg/m.        Physical Examination:   General appearance: Well appearing, and in no distress  Mental status: Alert, oriented to person, place, and time  Skin: Warm & dry  Cardiovascular: Normal heart rate noted  Respiratory: Normal respiratory effort, no distress  Abdomen: Soft, gravid, nontender  Pelvic: Cervical exam performed         Extremities: Edema: Trace  Fetal Status: Fetal Heart Rate (bpm): 161   Movement: Present Presentation: Vertex [place] 37+1 wks,cephalic,anterior pl gr 0,posterior accessory lobe,efw 3404 g 80% AC 95%,FHR 161 bpm,afi 17 cm   Results for orders placed or performed in visit on 04/06/18 (from the past 24 hour(s))  POCT urinalysis dipstick   Collection Time: 04/06/18 10:55 AM  Result Value Ref Range   Color, UA     Clarity, UA     Glucose, UA neg    Bilirubin, UA     Ketones, UA neg    Spec Grav, UA  1.010 - 1.025   Blood, UA neg    pH, UA  5.0 - 8.0   Protein, UA 1+    Urobilinogen, UA  0.2 or 1.0 E.U./dL   Nitrite, UA neg    Leukocytes, UA Negative Negative   Appearance     Odor       Orders Placed This Encounter  Procedures  . GC/Chlamydia Probe Amp  . Strep Gp B NAA+Rflx  . POCT urinalysis dipstick    Plan:  Continued routine obstetrical care,   Return in about 1 week (around 04/13/2018) for LROB.

## 2018-04-06 NOTE — Progress Notes (Signed)
[place] 37+1 wks,cephalic,anterior pl gr 0,posterior accessory lobe,efw 3404 g 80% AC 95%,FHR 161 bpm,afi 17 cm

## 2018-04-06 NOTE — Patient Instructions (Signed)

## 2018-04-07 LAB — GC/CHLAMYDIA PROBE AMP
CHLAMYDIA, DNA PROBE: NEGATIVE
NEISSERIA GONORRHOEAE BY PCR: NEGATIVE

## 2018-04-08 LAB — STREP GP B NAA+RFLX: Strep Gp B NAA+Rflx: NEGATIVE

## 2018-04-15 ENCOUNTER — Encounter: Payer: Medicaid Other | Admitting: Women's Health

## 2018-04-23 ENCOUNTER — Encounter: Payer: Self-pay | Admitting: Women's Health

## 2018-04-23 ENCOUNTER — Ambulatory Visit (INDEPENDENT_AMBULATORY_CARE_PROVIDER_SITE_OTHER): Payer: Medicaid Other | Admitting: Women's Health

## 2018-04-23 VITALS — BP 114/70 | HR 96 | Wt 226.0 lb

## 2018-04-23 DIAGNOSIS — Z3A39 39 weeks gestation of pregnancy: Secondary | ICD-10-CM | POA: Diagnosis not present

## 2018-04-23 DIAGNOSIS — Z3483 Encounter for supervision of other normal pregnancy, third trimester: Secondary | ICD-10-CM

## 2018-04-23 DIAGNOSIS — Z1389 Encounter for screening for other disorder: Secondary | ICD-10-CM

## 2018-04-23 DIAGNOSIS — O34219 Maternal care for unspecified type scar from previous cesarean delivery: Secondary | ICD-10-CM

## 2018-04-23 DIAGNOSIS — Z331 Pregnant state, incidental: Secondary | ICD-10-CM

## 2018-04-23 LAB — POCT URINALYSIS DIPSTICK
Blood, UA: NEGATIVE
GLUCOSE UA: NEGATIVE
KETONES UA: NEGATIVE
Leukocytes, UA: NEGATIVE
Nitrite, UA: NEGATIVE
Protein, UA: NEGATIVE

## 2018-04-23 NOTE — Treatment Plan (Signed)
Induction Assessment Scheduling Form Fax to [facility]:  [phone]  Debbie Mueller                                                                                   DOB:  03/01/1981                                                            MRN:  [id number]                                                                     Phone #:   [phone]                         Provider:  [facility]  GP:  [id number]                                                            Estimated Date of Delivery: 04/26/18  Dating Criteria: 12wk u/s    Medical Indications for induction:  postdates Admission Date/Time:  6/4 @ 0730 Gestational age on admission:  18.1   Filed Weights   04/23/18 1052  Weight: 226 lb (102.5 kg)   HIV:  Non Reactive (02/19 0905) GBS:  neg  1/th/-3, vtx   Method of induction(proposed):  foley   Scheduling Provider Signature:  Cheral Marker, CNM                                            Today's Date:  04/23/2018

## 2018-04-23 NOTE — Progress Notes (Signed)
   LOW-RISK PREGNANCY VISIT Patient name: Debbie Mueller MRN [id number]  Date of birth: Feb 17, 1981 Chief Complaint:   Routine Prenatal Visit  History of Present Illness:   Debbie Mueller is a 37 y.o. G3P2002 female at [redacted]w[redacted]d with an Estimated Date of Delivery: 04/26/18 being seen today for ongoing management of a low-risk pregnancy.  Today she reports bump on Rt mons, popped last night, feels better. Contractions: Irregular. Vag. Bleeding: None.  Movement: Present. denies leaking of fluid. Review of Systems:   Pertinent items are noted in HPI Denies abnormal vaginal discharge w/ itching/odor/irritation, headaches, visual changes, shortness of breath, chest pain, abdominal pain, severe nausea/vomiting, or problems with urination or bowel movements unless otherwise stated above. Pertinent History Reviewed:  Reviewed past medical,surgical, social, obstetrical and family history.  Reviewed problem list, medications and allergies. Physical Assessment:   Vitals:   04/23/18 1052  BP: 114/70  Pulse: 96  Weight: 226 lb (102.5 kg)  Body mass index is 41 kg/m.        Physical Examination:   General appearance: Well appearing, and in no distress  Mental status: Alert, oriented to person, place, and time  Skin: Warm & dry  Cardiovascular: Normal heart rate noted  Respiratory: Normal respiratory effort, no distress  Abdomen: Soft, gravid, nontender  Pelvic: Cervical exam performed  Dilation: 1 Effacement (%): Thick Station: -3. Resolving boil Rt mons  Extremities: Edema: Trace  Fetal Status: Fetal Heart Rate (bpm): 148 Fundal Height: 37 cm Movement: Present Presentation: Vertex  Results for orders placed or performed in visit on 04/23/18 (from the past 24 hour(s))  POCT Urinalysis Dipstick   Collection Time: 04/23/18 11:00 AM  Result Value Ref Range   Color, UA     Clarity, UA     Glucose, UA Negative Negative   Bilirubin, UA     Ketones, UA neg    Spec Grav, UA  1.010 - 1.025   Blood,  UA neg    pH, UA  5.0 - 8.0   Protein, UA Negative Negative   Urobilinogen, UA  0.2 or 1.0 E.U./dL   Nitrite, UA neg    Leukocytes, UA Negative Negative   Appearance     Odor      Assessment & Plan:  1) Low-risk pregnancy G3P2002 at [redacted]w[redacted]d with an Estimated Date of Delivery: 04/26/18   2) Prev vag then c/s, wants TOLAC, consent signed 4/9  3) IOL scheduled for 6/4 @ 0730 for postdates.  IOL form faxed via Epic and orders placed   4) Resolving boil Rt mons> warm compresses   Meds: No orders of the defined types were placed in this encounter.  Labs/procedures today: sve  Plan:  Continue routine obstetrical care   Reviewed: Term labor symptoms and general obstetric precautions including but not limited to vaginal bleeding, contractions, leaking of fluid and fetal movement were reviewed in detail with the patient.  All questions were answered  Follow-up: Return in about 1 week (around 04/30/2018) for LROB.  Orders Placed This Encounter  Procedures  . POCT Urinalysis Dipstick   Cheral Marker CNM, [facility] 04/23/2018 12:23 PM

## 2018-04-23 NOTE — Patient Instructions (Addendum)
Debbie Mueller, I greatly value your feedback.  If you receive a survey following your visit with [place] today, we appreciate you taking the time to fill it out.  Thanks, Debbie Mueller, CNM, WHNP-BC  Your induction is scheduled for 6/4 @ 7:30am. Go to [facility] hospital, Maternity Admissions Unit (Emergency) entrance and let them know you are there to be induced. They will send someone from Labor & Delivery to come get you.     Call the office [phone]) or go to [facility] if:  You begin to have strong, frequent contractions  Your water breaks.  Sometimes it is a big gush of fluid, sometimes it is just a trickle that keeps getting your panties wet or running down your legs  You have vaginal bleeding.  It is normal to have a small amount of spotting if your cervix was checked.   You don't feel your baby moving like normal.  If you don't, get you something to eat and drink and lay down and focus on feeling your baby move.  You should feel at least 10 movements in 2 hours.  If you don't, you should call the office or go to [facility].     [facility] Contractions Contractions of the uterus can occur throughout pregnancy, but they are not always a sign that you are in labor. You may have practice contractions called Braxton Hicks contractions. These false labor contractions are sometimes confused with true labor. What are Debbie Mueller contractions? Braxton Hicks contractions are tightening movements that occur in the muscles of the uterus before labor. Unlike true labor contractions, these contractions do not result in opening (dilation) and thinning of the cervix. Toward the end of pregnancy (32-34 weeks), Braxton Hicks contractions can happen more often and may become stronger. These contractions are sometimes difficult to tell apart from true labor because they can be very uncomfortable. You should not feel embarrassed if you go to the hospital with false labor. Sometimes, the only way to  tell if you are in true labor is for your health care provider to look for changes in the cervix. The health care provider will do a physical exam and may monitor your contractions. If you are not in true labor, the exam should show that your cervix is not dilating and your water has not broken. If there are other health problems associated with your pregnancy, it is completely safe for you to be sent home with false labor. You may continue to have Braxton Hicks contractions until you go into true labor. How to tell the difference between true labor and false labor True labor  Contractions last 30-70 seconds.  Contractions become very regular.  Discomfort is usually felt in the top of the uterus, and it spreads to the lower abdomen and low back.  Contractions do not go away with walking.  Contractions usually become more intense and increase in frequency.  The cervix dilates and gets thinner. False labor  Contractions are usually shorter and not as strong as true labor contractions.  Contractions are usually irregular.  Contractions are often felt in the front of the lower abdomen and in the groin.  Contractions may go away when you walk around or change positions while lying down.  Contractions get weaker and are shorter-lasting as time goes on.  The cervix usually does not dilate or become thin. Follow these instructions at home:  Take over-the-counter and prescription medicines only as told by your health care provider.  Keep  up with your usual exercises and follow other instructions from your health care provider.  Eat and drink lightly if you think you are going into labor.  If Braxton Hicks contractions are making you uncomfortable: ? Change your position from lying down or resting to walking, or change from walking to resting. ? Sit and rest in a tub of warm water. ? Drink enough fluid to keep your urine pale yellow. Dehydration may cause these contractions. ? Do slow  and deep breathing several times an hour.  Keep all follow-up prenatal visits as told by your health care provider. This is important. Contact a health care provider if:  You have a fever.  You have continuous pain in your abdomen. Get help right away if:  Your contractions become stronger, more regular, and closer together.  You have fluid leaking or gushing from your vagina.  You pass blood-tinged mucus (bloody show).  You have bleeding from your vagina.  You have low back pain that you never had before.  You feel your baby's head pushing down and causing pelvic pressure.  Your baby is not moving inside you as much as it used to. Summary  Contractions that occur before labor are called Braxton Hicks contractions, false labor, or practice contractions.  Braxton Hicks contractions are usually shorter, weaker, farther apart, and less regular than true labor contractions. True labor contractions usually become progressively stronger and regular and they become more frequent.  Manage discomfort from [facility] contractions by changing position, resting in a warm bath, drinking plenty of water, or practicing deep breathing. This information is not intended to replace advice given to you by your health care provider. Make sure you discuss any questions you have with your health care provider. Document Released: 04/02/2017 Document Revised: 04/02/2017 Document Reviewed: 04/02/2017 Elsevier Interactive Patient Education  2018 [employer].

## 2018-04-27 ENCOUNTER — Telehealth: Payer: Self-pay | Admitting: Obstetrics & Gynecology

## 2018-04-27 ENCOUNTER — Telehealth (HOSPITAL_COMMUNITY): Payer: Self-pay | Admitting: *Deleted

## 2018-04-27 NOTE — Telephone Encounter (Signed)
Patient states she had a speck of blood in her discharge.  Is also having some cramping but nothing more.  Baby is active. States she has had some "watery discharge" a couple of days ago that comes and goes and has ran down her leg at times.  Informed patient it was difficult to diagnose ROM over the phone, so advised she go to [facility] for evaluation if she thought her water might be broken or if bleeding increased. Pt verbalized understanding.

## 2018-04-27 NOTE — Telephone Encounter (Signed)
Preadmission screen  

## 2018-04-28 ENCOUNTER — Encounter (HOSPITAL_COMMUNITY): Payer: Self-pay

## 2018-04-28 ENCOUNTER — Inpatient Hospital Stay (HOSPITAL_COMMUNITY)
Admission: AD | Admit: 2018-04-28 | Discharge: 2018-05-01 | DRG: 798 | Disposition: A | Discharge status: Home / Self Care | Payer: Medicaid Other | Attending: Obstetrics & Gynecology | Admitting: Obstetrics & Gynecology

## 2018-04-28 DIAGNOSIS — O134 Gestational [pregnancy-induced] hypertension without significant proteinuria, complicating childbirth: Secondary | ICD-10-CM | POA: Diagnosis present

## 2018-04-28 DIAGNOSIS — Z3A40 40 weeks gestation of pregnancy: Secondary | ICD-10-CM

## 2018-04-28 DIAGNOSIS — M419 Scoliosis, unspecified: Secondary | ICD-10-CM | POA: Diagnosis present

## 2018-04-28 DIAGNOSIS — Z302 Encounter for sterilization: Secondary | ICD-10-CM

## 2018-04-28 DIAGNOSIS — O480 Post-term pregnancy: Secondary | ICD-10-CM | POA: Diagnosis present

## 2018-04-28 DIAGNOSIS — O34219 Maternal care for unspecified type scar from previous cesarean delivery: Secondary | ICD-10-CM

## 2018-04-28 DIAGNOSIS — O99214 Obesity complicating childbirth: Secondary | ICD-10-CM | POA: Diagnosis present

## 2018-04-28 DIAGNOSIS — O99334 Smoking (tobacco) complicating childbirth: Secondary | ICD-10-CM | POA: Diagnosis present

## 2018-04-28 DIAGNOSIS — O358XX0 Maternal care for other (suspected) fetal abnormality and damage, not applicable or unspecified: Secondary | ICD-10-CM | POA: Diagnosis present

## 2018-04-28 DIAGNOSIS — F17210 Nicotine dependence, cigarettes, uncomplicated: Secondary | ICD-10-CM | POA: Diagnosis present

## 2018-04-28 DIAGNOSIS — Z23 Encounter for immunization: Secondary | ICD-10-CM

## 2018-04-28 LAB — PROTEIN / CREATININE RATIO, URINE
CREATININE, URINE: 331 mg/dL
Protein Creatinine Ratio: 0.13 mg/mg{Cre} (ref 0.00–0.15)
Total Protein, Urine: 42 mg/dL

## 2018-04-28 LAB — COMPREHENSIVE METABOLIC PANEL
ALT: 12 U/L — ABNORMAL LOW (ref 14–54)
AST: 21 U/L (ref 15–41)
Albumin: 2.9 g/dL — ABNORMAL LOW (ref 3.5–5.0)
Alkaline Phosphatase: 90 U/L (ref 38–126)
Anion gap: 12 (ref 5–15)
BUN: 6 mg/dL (ref 6–20)
CHLORIDE: 101 mmol/L (ref 101–111)
CO2: 18 mmol/L — ABNORMAL LOW (ref 22–32)
Calcium: 9 mg/dL (ref 8.9–10.3)
Creatinine, Ser: 0.51 mg/dL (ref 0.44–1.00)
GFR calc Af Amer: >60 mL/min (ref >60)
GFR calc non Af Amer: >60 mL/min (ref >60)
GLUCOSE: 109 mg/dL — AB (ref 65–99)
POTASSIUM: 3.6 mmol/L (ref 3.5–5.1)
SODIUM: 131 mmol/L — AB (ref 135–145)
Total Bilirubin: 0.5 mg/dL (ref 0.3–1.2)
Total Protein: 7 g/dL (ref 6.5–8.1)

## 2018-04-28 LAB — CBC
HCT: 36.2 % (ref 36.0–46.0)
Hemoglobin: 12 g/dL (ref 12.0–15.0)
MCH: 29.5 pg (ref 26.0–34.0)
MCHC: 33.1 g/dL (ref 30.0–36.0)
MCV: 88.9 fL (ref 78.0–100.0)
PLATELETS: 221 10*3/uL (ref 150–400)
RBC: 4.07 MIL/uL (ref 3.87–5.11)
RDW: 15.5 % (ref 11.5–15.5)
WBC: 16.1 10*3/uL — AB (ref 4.0–10.5)

## 2018-04-28 LAB — URINALYSIS, ROUTINE W REFLEX MICROSCOPIC
Bilirubin Urine: NEGATIVE
GLUCOSE, UA: NEGATIVE mg/dL
Ketones, ur: 20 mg/dL — AB
NITRITE: NEGATIVE
PROTEIN: 100 mg/dL — AB
Specific Gravity, Urine: 1.03 (ref 1.005–1.030)
Squamous Epithelial / LPF: >50 — ABNORMAL HIGH (ref 0–5)
pH: 5 (ref 5.0–8.0)

## 2018-04-28 MED ORDER — LACTATED RINGERS IV SOLN
INTRAVENOUS | Status: DC
  Administered 2018-04-28 – 2018-04-29 (×2): via INTRAVENOUS

## 2018-04-28 NOTE — MAU Note (Signed)
Pt presents to MAU with c/o ctx that started at 4 pm this afternoon. Pt has had blood show. Pt denies LOF. +FM

## 2018-04-28 NOTE — H&P (Signed)
Obstetric History and Physical  Debbie Mueller is a 37 y.o. [id number] with IUP at [redacted]w[redacted]d presenting for early labor. While in MAU patient noted to have elevated BPs thus meeting criteria for new-onset gHTN. Patient states she has been having regular contractions since about 4p, none vaginal bleeding, ruptured, clear fluid membranes in MAU, with active fetal movement.  No blurry vision, headaches or peripheral edema, and RUQ pain.   Prenatal Course Source of Care: FT with onset of care at 14 weeks Dating: By [place] 1st trimester --->  Estimated Date of Delivery: 04/26/18 Pregnancy complications or risks: Patient Active Problem List   Diagnosis Date Noted  . Supervision of normal pregnancy 10/28/2017  . History of cesarean delivery 10/28/2017  . Hematuria 10/02/2017  . Smoker 10/02/2017   She plans to bottle feed She desires bilateral tubal ligation for postpartum contraception.   Sono:    , CWD, normal anatomy w/ dolichocephalic shaped head, breech presentation, anterior placenta w/ accessory lobe, 482g, 39% EFW  Prenatal labs and studies: ABO, Rh: O/Positive/-- (11/28 1138) Antibody: Negative (02/19 0905) Rubella: 1.38 (11/28 1138) RPR: Non Reactive (02/19 0905)  HBsAg: Negative (11/28 1138)  HIV: Non Reactive (02/19 0905)  GBS: Negative 2 hr Glucola  normal Genetic screening declined Anatomy US abnormal with dolichocephaly at 22wks  Prenatal Transfer Tool  Maternal Diabetes: No Genetic Screening: Declined Maternal Ultrasounds/Referrals: Abnormal:  Findings:   Other: dolichocephaly Fetal Ultrasounds or other Referrals:  None Maternal Substance Abuse:  No Significant Maternal Medications:  None Significant Maternal Lab Results: None  Past Medical History:  Diagnosis Date  . Acid reflux   . Anxiety   . Depression    PP depression  . Loss of teeth due to gum disease    wears dentures  . Overactive bladder   . Scoliosis   . Vitamin D deficiency     Past Surgical  History:  Procedure Laterality Date  . CESAREAN SECTION      OB History  Gravida Para Term Preterm AB Living  SAB TAB Ectopic Multiple Live Births          2    # Outcome Date GA Lbr Len/2nd Weight Sex Delivery Anes PTL Lv  3 Current           2 Term 01/25/10 [redacted]w[redacted]d  7 lb 8 oz (3.402 kg) F CS-Unspec Spinal N LIV     Birth Comments: breech  1 Term 03/29/03 [redacted]w[redacted]d  8 lb 7 oz (3.827 kg) M Vag-Spont EPI N LIV    Social History   Socioeconomic History  . Marital status: Single    Spouse name: Not on file  . Number of children: Not on file  . Years of education: Not on file  . Highest education level: Not on file  Occupational History  . Not on file  Social Needs  . Financial resource strain: Not on file  . Food insecurity:    Worry: Not on file    Inability: Not on file  . Transportation needs:    Medical: Not on file    Non-medical: Not on file  Tobacco Use  . Smoking status: Current Every Day Smoker    Packs/day: 0.25    Years: 10.00    Pack years: 2.50    Types: Cigarettes  . Smokeless tobacco: Never Used  . Tobacco comment: smokes 4 cig a day  Substance and Sexual Activity  . Alcohol use: No  .  Drug use: No  . Sexual activity: Yes    Birth control/protection: None  Lifestyle  . Physical activity:    Days per week: Not on file    Minutes per session: Not on file  . Stress: Not on file  Relationships  . Social connections:    Talks on phone: Not on file    Gets together: Not on file    Attends religious service: Not on file    Active member of club or organization: Not on file    Attends meetings of clubs or organizations: Not on file    Relationship status: Not on file  Other Topics Concern  . Not on file  Social History Narrative  . Not on file    Family History  Problem Relation Age of Onset  . Diabetes Paternal Grandfather   . Hypertension Paternal Grandfather   . Diabetes Paternal Grandmother   . Hypertension Paternal Grandmother    . Heart attack Maternal Grandmother   . Heart attack Maternal Grandfather   . Anemia Father   . Hypertension Father   . Breast cancer Mother   . Depression Brother     Medications Prior to Admission  Medication Sig Dispense Refill Last Dose  . Calcium Carbonate Antacid (TUMS PO) Take by mouth as needed.   Taking  . Doxylamine-Pyridoxine ER (BONJESTA) 20-20 MG TBCR Take 1 tablet by mouth 2 (two) times daily at 8 am and 10 pm. 24 tablet 0 Taking  . Prenatal Vit-Fe Fumarate-FA (PRENATAL VITAMIN PO) Take by mouth daily.   Taking    Allergies  Allergen Reactions  . Penicillins Rash    Review of Systems: Negative except for what is mentioned in HPI.  Physical Exam: BP (!) 139/93   Pulse (!) 102   Temp 98.1 F (36.7 C) (Oral)   Resp 16   LMP 08/19/2017 (Approximate)   SpO2 91%  CONSTITUTIONAL: Well-developed, well-nourished female in no acute distress.  HENT:  Normocephalic, atraumatic, External right and left ear normal. Oropharynx is clear and moist EYES: Conjunctivae and EOM are normal. Pupils are equal, round, and reactive to light. No scleral icterus.  NECK: Normal range of motion, supple, no masses SKIN: Skin is warm and dry. No rash noted. Not diaphoretic. No erythema. No pallor. NEUROLOGIC: Alert and oriented to person, place, and time. Normal reflexes, muscle tone coordination. No cranial nerve deficit noted. PSYCHIATRIC: Normal mood and affect. Normal behavior. Normal judgment and thought content. CARDIOVASCULAR: Normal heart rate noted, regular rhythm RESPIRATORY: Effort and breath sounds normal, no problems with respiration noted ABDOMEN: Soft, nontender, nondistended, gravid. MUSCULOSKELETAL: Normal range of motion. No edema and no tenderness. 2+ distal pulses.  Cervical Exam: Dilation: 3.5 Effacement (%): 80 Cervical Position: Posterior Station: -2 Presentation: Vertex Exam by:: Debbie Corner, DO FHT:  Baseline rate 115 bpm   Variability moderate  Accelerations  present   Decelerations none Contractions: Irregular   Pertinent Labs/Studies:   Results for orders placed or performed during the hospital encounter of 04/28/18 (from the past 24 hour(s))  Protein / creatinine ratio, urine     Status: None   Collection Time: 04/28/18 10:07 PM  Result Value Ref Range   Creatinine, Urine 331.00 mg/dL   Total Protein, Urine 42 mg/dL   Protein Creatinine Ratio 0.13 0.00 - 0.15 mg/mg[Cre]  Urinalysis, Routine w reflex microscopic     Status: Abnormal   Collection Time: 04/28/18 10:48 PM  Result Value Ref Range   Color, Urine AMBER (A) YELLOW  APPearance CLOUDY (A) CLEAR   Specific Gravity, Urine 1.030 1.005 - 1.030   pH 5.0 5.0 - 8.0   Glucose, UA NEGATIVE NEGATIVE mg/dL   Hgb urine dipstick MODERATE (A) NEGATIVE   Bilirubin Urine NEGATIVE NEGATIVE   Ketones, ur 20 (A) NEGATIVE mg/dL   Protein, ur 161 (A) NEGATIVE mg/dL   Nitrite NEGATIVE NEGATIVE   Leukocytes, UA SMALL (A) NEGATIVE   RBC / HPF 11-20 0 - 5 RBC/hpf   WBC, UA 11-20 0 - 5 WBC/hpf   Bacteria, UA RARE (A) NONE SEEN   Squamous Epithelial / LPF >50 (H) 0 - 5   Mucus PRESENT   CBC     Status: Abnormal   Collection Time: 04/28/18 11:04 PM  Result Value Ref Range   WBC 16.1 (H) 4.0 - 10.5 K/uL   RBC 4.07 3.87 - 5.11 MIL/uL   Hemoglobin 12.0 12.0 - 15.0 g/dL   HCT 09.6 04.5 - 40.9 %   MCV 88.9 78.0 - 100.0 fL   MCH 29.5 26.0 - 34.0 pg   MCHC 33.1 30.0 - 36.0 g/dL   RDW 81.1 91.4 - 78.2 %   Platelets 221 150 - 400 K/uL  Comprehensive metabolic panel     Status: Abnormal   Collection Time: 04/28/18 11:04 PM  Result Value Ref Range   Sodium 131 (L) 135 - 145 mmol/L   Potassium 3.6 3.5 - 5.1 mmol/L   Chloride 101 101 - 111 mmol/L   CO2 18 (L) 22 - 32 mmol/L   Glucose, Bld 109 (H) 65 - 99 mg/dL   BUN 6 6 - 20 mg/dL   Creatinine, Ser 9.56 0.44 - 1.00 mg/dL   Calcium 9.0 8.9 - 21.3 mg/dL   Total Protein 7.0 6.5 - 8.1 g/dL   Albumin 2.9 (L) 3.5 - 5.0 g/dL   AST 21 15 - 41 U/L   ALT  12 (L) 14 - 54 U/L   Alkaline Phosphatase 90 38 - 126 U/L   Total Bilirubin 0.5 0.3 - 1.2 mg/dL   GFR calc non Af Amer >60 >60 mL/min   GFR calc Af Amer >60 >60 mL/min   Anion gap 12 5 - 15    Assessment : MAHA FISCHEL is a 37 y.o. G3P2002 at [redacted]w[redacted]d being admitted for early labor. Meeting criteria for new-onset gHTN. Postdates. Prior c/s x1 and wants to [facility]. SROM around 2350.  Plan: Labor: Expectant management. If no change in the next couple of hours will augment labor due to gHTN. GHTN: BPs minimally elevated. Not in severe range. No symptoms. PIH labs wnl.  Analgesia as needed. FWB: Reassuring fetal heart tracing.   GBS negative Delivery plan: Hopeful for vaginal delivery   Debbie Ada, DO OB Fellow Faculty Practice, [facility] 04/28/2018, 11:40 PM

## 2018-04-29 ENCOUNTER — Encounter (HOSPITAL_COMMUNITY): Payer: Self-pay

## 2018-04-29 ENCOUNTER — Other Ambulatory Visit: Payer: Self-pay

## 2018-04-29 ENCOUNTER — Inpatient Hospital Stay (HOSPITAL_COMMUNITY): Payer: Medicaid Other | Admitting: Anesthesiology

## 2018-04-29 DIAGNOSIS — Z3A40 40 weeks gestation of pregnancy: Secondary | ICD-10-CM | POA: Diagnosis not present

## 2018-04-29 DIAGNOSIS — F17210 Nicotine dependence, cigarettes, uncomplicated: Secondary | ICD-10-CM | POA: Diagnosis present

## 2018-04-29 DIAGNOSIS — M419 Scoliosis, unspecified: Secondary | ICD-10-CM | POA: Diagnosis present

## 2018-04-29 DIAGNOSIS — O99214 Obesity complicating childbirth: Secondary | ICD-10-CM | POA: Diagnosis present

## 2018-04-29 DIAGNOSIS — O480 Post-term pregnancy: Secondary | ICD-10-CM | POA: Diagnosis present

## 2018-04-29 DIAGNOSIS — O34219 Maternal care for unspecified type scar from previous cesarean delivery: Secondary | ICD-10-CM | POA: Diagnosis present

## 2018-04-29 DIAGNOSIS — O99334 Smoking (tobacco) complicating childbirth: Secondary | ICD-10-CM | POA: Diagnosis present

## 2018-04-29 DIAGNOSIS — Z23 Encounter for immunization: Secondary | ICD-10-CM | POA: Diagnosis not present

## 2018-04-29 DIAGNOSIS — O134 Gestational [pregnancy-induced] hypertension without significant proteinuria, complicating childbirth: Secondary | ICD-10-CM | POA: Diagnosis present

## 2018-04-29 DIAGNOSIS — Z302 Encounter for sterilization: Secondary | ICD-10-CM | POA: Diagnosis not present

## 2018-04-29 DIAGNOSIS — O358XX0 Maternal care for other (suspected) fetal abnormality and damage, not applicable or unspecified: Secondary | ICD-10-CM | POA: Diagnosis present

## 2018-04-29 LAB — ABO/RH: ABO/RH(D): O POS

## 2018-04-29 LAB — RPR: RPR Ser Ql: NONREACTIVE

## 2018-04-29 LAB — TYPE AND SCREEN
ABO/RH(D): O POS
ANTIBODY SCREEN: NEGATIVE

## 2018-04-29 LAB — POCT FERN TEST: POCT FERN TEST: POSITIVE

## 2018-04-29 MED ORDER — ACETAMINOPHEN 325 MG PO TABS: 650.0000 mg | ORAL_TABLET | ORAL | Status: DC | PRN

## 2018-04-29 MED ORDER — MEASLES, MUMPS & RUBELLA VAC ~~LOC~~ INJ
0.5000 mL | INJECTION | Freq: Once | SUBCUTANEOUS | Status: DC
  Filled 2018-04-29: qty 0.5

## 2018-04-29 MED ORDER — ONDANSETRON HCL 4 MG/2ML IJ SOLN
4.0000 mg | Freq: Four times a day (QID) | INTRAMUSCULAR | Status: DC | PRN
  Administered 2018-04-30: 4 mg via INTRAVENOUS

## 2018-04-29 MED ORDER — DIBUCAINE 1 % RE OINT: 1.0000 "application " | TOPICAL_OINTMENT | RECTAL | Status: DC | PRN

## 2018-04-29 MED ORDER — FENTANYL CITRATE (PF) 100 MCG/2ML IJ SOLN: 50.0000 ug | INTRAMUSCULAR | Status: DC | PRN

## 2018-04-29 MED ORDER — SOD CITRATE-CITRIC ACID 500-334 MG/5ML PO SOLN: 30.0000 mL | ORAL | Status: DC | PRN

## 2018-04-29 MED ORDER — DIPHENHYDRAMINE HCL 25 MG PO CAPS: 25.0000 mg | ORAL_CAPSULE | Freq: Four times a day (QID) | ORAL | Status: DC | PRN

## 2018-04-29 MED ORDER — OXYCODONE-ACETAMINOPHEN 5-325 MG PO TABS: 1.0000 | ORAL_TABLET | ORAL | Status: DC | PRN

## 2018-04-29 MED ORDER — LACTATED RINGERS IV SOLN: 500.0000 mL | INTRAVENOUS | Status: DC | PRN

## 2018-04-29 MED ORDER — CEFAZOLIN SODIUM-DEXTROSE 2-4 GM/100ML-% IV SOLN
2.0000 g | Freq: Once | INTRAVENOUS | Status: AC
  Administered 2018-04-29: 2 g via INTRAVENOUS
  Filled 2018-04-29: qty 100

## 2018-04-29 MED ORDER — FENTANYL 2.5 MCG/ML BUPIVACAINE 1/10 % EPIDURAL INFUSION (WH - ANES)
INTRAMUSCULAR | Status: AC
  Filled 2018-04-29: qty 100

## 2018-04-29 MED ORDER — ONDANSETRON HCL 4 MG PO TABS: 4.0000 mg | ORAL_TABLET | ORAL | Status: DC | PRN

## 2018-04-29 MED ORDER — OXYTOCIN BOLUS FROM INFUSION: 500.0000 mL | Freq: Once | INTRAVENOUS | Status: DC

## 2018-04-29 MED ORDER — IBUPROFEN 600 MG PO TABS: 600.0000 mg | ORAL_TABLET | Freq: Four times a day (QID) | ORAL | Status: DC

## 2018-04-29 MED ORDER — WITCH HAZEL-GLYCERIN EX PADS: 1.0000 "application " | MEDICATED_PAD | CUTANEOUS | Status: DC | PRN

## 2018-04-29 MED ORDER — EPHEDRINE 5 MG/ML INJ: 10.0000 mg | INTRAVENOUS | Status: DC | PRN

## 2018-04-29 MED ORDER — COCONUT OIL OIL: 1.0000 "application " | TOPICAL_OIL | Status: DC | PRN

## 2018-04-29 MED ORDER — LIDOCAINE HCL (PF) 1 % IJ SOLN
30.0000 mL | INTRAMUSCULAR | Status: DC | PRN
  Filled 2018-04-29: qty 30

## 2018-04-29 MED ORDER — LACTATED RINGERS IV SOLN
INTRAVENOUS | Status: DC
  Administered 2018-04-29: 14:00:00 via INTRAVENOUS

## 2018-04-29 MED ORDER — BENZOCAINE-MENTHOL 20-0.5 % EX AERO: 1.0000 "application " | INHALATION_SPRAY | CUTANEOUS | Status: DC | PRN

## 2018-04-29 MED ORDER — FLEET ENEMA 7-19 GM/118ML RE ENEM: 1.0000 | ENEMA | RECTAL | Status: DC | PRN

## 2018-04-29 MED ORDER — ONDANSETRON HCL 4 MG/2ML IJ SOLN: 4.0000 mg | Freq: Four times a day (QID) | INTRAMUSCULAR | Status: DC | PRN

## 2018-04-29 MED ORDER — SOD CITRATE-CITRIC ACID 500-334 MG/5ML PO SOLN
30.0000 mL | ORAL | Status: DC | PRN
  Administered 2018-04-29: 30 mL via ORAL
  Filled 2018-04-29: qty 15

## 2018-04-29 MED ORDER — TERBUTALINE SULFATE 1 MG/ML IJ SOLN: 0.2500 mg | Freq: Once | INTRAMUSCULAR | Status: DC | PRN

## 2018-04-29 MED ORDER — METOCLOPRAMIDE HCL 10 MG PO TABS
10.0000 mg | ORAL_TABLET | Freq: Once | ORAL | Status: AC
  Administered 2018-04-29: 10 mg via ORAL
  Filled 2018-04-29: qty 1

## 2018-04-29 MED ORDER — OXYTOCIN BOLUS FROM INFUSION
500.0000 mL | Freq: Once | INTRAVENOUS | Status: AC
  Administered 2018-04-29: 500 mL via INTRAVENOUS

## 2018-04-29 MED ORDER — LACTATED RINGERS IV SOLN: INTRAVENOUS | Status: DC

## 2018-04-29 MED ORDER — TETANUS-DIPHTH-ACELL PERTUSSIS 5-2.5-18.5 LF-MCG/0.5 IM SUSP
0.5000 mL | Freq: Once | INTRAMUSCULAR | Status: AC
  Administered 2018-04-30: 0.5 mL via INTRAMUSCULAR

## 2018-04-29 MED ORDER — OXYTOCIN 40 UNITS IN LACTATED RINGERS INFUSION - SIMPLE MED: 2.5000 [IU]/h | INTRAVENOUS | Status: DC

## 2018-04-29 MED ORDER — OXYCODONE-ACETAMINOPHEN 5-325 MG PO TABS: 2.0000 | ORAL_TABLET | ORAL | Status: DC | PRN

## 2018-04-29 MED ORDER — ONDANSETRON HCL 4 MG/2ML IJ SOLN: 4.0000 mg | INTRAMUSCULAR | Status: DC | PRN

## 2018-04-29 MED ORDER — WITCH HAZEL-GLYCERIN EX PADS
1.0000 "application " | MEDICATED_PAD | CUTANEOUS | Status: DC | PRN
  Administered 2018-04-29: 1 via TOPICAL

## 2018-04-29 MED ORDER — BENZOCAINE-MENTHOL 20-0.5 % EX AERO
1.0000 "application " | INHALATION_SPRAY | CUTANEOUS | Status: DC | PRN
  Administered 2018-04-29: 1 via TOPICAL
  Filled 2018-04-29 (×2): qty 56

## 2018-04-29 MED ORDER — BUPIVACAINE HCL (PF) 0.25 % IJ SOLN
INTRAMUSCULAR | Status: DC | PRN
  Administered 2018-04-29: 8 mL via EPIDURAL

## 2018-04-29 MED ORDER — LIDOCAINE HCL (PF) 1 % IJ SOLN
INTRAMUSCULAR | Status: DC | PRN
  Administered 2018-04-29 (×2): 5 mL via EPIDURAL

## 2018-04-29 MED ORDER — LACTATED RINGERS IV SOLN: 500.0000 mL | Freq: Once | INTRAVENOUS | Status: DC

## 2018-04-29 MED ORDER — PHENYLEPHRINE 40 MCG/ML (10ML) SYRINGE FOR IV PUSH (FOR BLOOD PRESSURE SUPPORT): 80.0000 ug | PREFILLED_SYRINGE | INTRAVENOUS | Status: DC | PRN

## 2018-04-29 MED ORDER — FAMOTIDINE 20 MG PO TABS
40.0000 mg | ORAL_TABLET | Freq: Once | ORAL | Status: AC
  Administered 2018-04-29: 40 mg via ORAL
  Filled 2018-04-29: qty 2

## 2018-04-29 MED ORDER — METHYLERGONOVINE MALEATE 0.2 MG/ML IJ SOLN
INTRAMUSCULAR | Status: AC
  Administered 2018-04-29: 0.2 mg
  Filled 2018-04-29: qty 1

## 2018-04-29 MED ORDER — IBUPROFEN 600 MG PO TABS
600.0000 mg | ORAL_TABLET | Freq: Four times a day (QID) | ORAL | Status: DC
  Administered 2018-04-29 – 2018-05-01 (×8): 600 mg via ORAL
  Filled 2018-04-29 (×8): qty 1

## 2018-04-29 MED ORDER — FENTANYL CITRATE (PF) 100 MCG/2ML IJ SOLN: 100.0000 ug | INTRAMUSCULAR | Status: DC | PRN

## 2018-04-29 MED ORDER — PHENYLEPHRINE 40 MCG/ML (10ML) SYRINGE FOR IV PUSH (FOR BLOOD PRESSURE SUPPORT)
PREFILLED_SYRINGE | INTRAVENOUS | Status: AC
  Filled 2018-04-29: qty 10

## 2018-04-29 MED ORDER — FENTANYL 2.5 MCG/ML BUPIVACAINE 1/10 % EPIDURAL INFUSION (WH - ANES)
14.0000 mL/h | INTRAMUSCULAR | Status: DC | PRN
  Administered 2018-04-29: 14 mL/h via EPIDURAL
  Filled 2018-04-29: qty 100

## 2018-04-29 MED ORDER — OXYTOCIN 40 UNITS IN LACTATED RINGERS INFUSION - SIMPLE MED
1.0000 m[IU]/min | INTRAVENOUS | Status: DC
  Administered 2018-04-29: 2 m[IU]/min via INTRAVENOUS

## 2018-04-29 MED ORDER — SIMETHICONE 80 MG PO CHEW: 80.0000 mg | CHEWABLE_TABLET | ORAL | Status: DC | PRN

## 2018-04-29 MED ORDER — ZOLPIDEM TARTRATE 5 MG PO TABS: 5.0000 mg | ORAL_TABLET | Freq: Every evening | ORAL | Status: DC | PRN

## 2018-04-29 MED ORDER — HYDROXYZINE HCL 50 MG PO TABS
50.0000 mg | ORAL_TABLET | Freq: Four times a day (QID) | ORAL | Status: DC | PRN
  Filled 2018-04-29: qty 1

## 2018-04-29 MED ORDER — OXYTOCIN 40 UNITS IN LACTATED RINGERS INFUSION - SIMPLE MED
2.5000 [IU]/h | INTRAVENOUS | Status: DC
  Filled 2018-04-29: qty 1000

## 2018-04-29 MED ORDER — PRENATAL MULTIVITAMIN CH
1.0000 | ORAL_TABLET | Freq: Every day | ORAL | Status: DC
  Administered 2018-05-01: 1 via ORAL
  Filled 2018-04-29: qty 1

## 2018-04-29 MED ORDER — PRENATAL MULTIVITAMIN CH: 1.0000 | ORAL_TABLET | Freq: Every day | ORAL | Status: DC

## 2018-04-29 MED ORDER — TETANUS-DIPHTH-ACELL PERTUSSIS 5-2.5-18.5 LF-MCG/0.5 IM SUSP: 0.5000 mL | Freq: Once | INTRAMUSCULAR | Status: DC

## 2018-04-29 MED ORDER — FERROUS SULFATE 325 (65 FE) MG PO TABS
325.0000 mg | ORAL_TABLET | Freq: Two times a day (BID) | ORAL | Status: DC
  Administered 2018-04-29 – 2018-05-01 (×3): 325 mg via ORAL
  Filled 2018-04-29 (×3): qty 1

## 2018-04-29 MED ORDER — DIPHENHYDRAMINE HCL 50 MG/ML IJ SOLN: 12.5000 mg | INTRAMUSCULAR | Status: DC | PRN

## 2018-04-29 MED ORDER — SENNOSIDES-DOCUSATE SODIUM 8.6-50 MG PO TABS
2.0000 | ORAL_TABLET | ORAL | Status: DC
  Administered 2018-04-29 – 2018-05-01 (×2): 2 via ORAL
  Filled 2018-04-29 (×2): qty 2

## 2018-04-29 NOTE — Anesthesia Postprocedure Evaluation (Signed)
Anesthesia Post Note  Patient: Debbie Mueller  Procedure(s) Performed: AN AD HOC LABOR EPIDURAL     Patient location during evaluation: Mother Baby Anesthesia Type: Epidural Level of consciousness: awake and alert Pain management: pain level controlled Vital Signs Assessment: post-procedure vital signs reviewed and stable Respiratory status: spontaneous breathing, nonlabored ventilation and respiratory function stable Cardiovascular status: stable Postop Assessment: no headache, no backache, epidural receding, able to ambulate, adequate PO intake, no apparent nausea or vomiting and patient able to bend at knees Anesthetic complications: no    Last Vitals:  Vitals:   04/29/18 1305 04/29/18 1410  BP: (!) 105/55 126/85  Pulse: 93 89  Resp: 18 18  Temp: 37.3 C 37.3 C  SpO2:  97%    Last Pain:  Vitals:   04/29/18 1410  TempSrc: Oral  PainSc: 0-No pain   Pain Goal: Patients Stated Pain Goal: 3 (04/29/18 1305)               Laban Emperor

## 2018-04-29 NOTE — Plan of Care (Signed)
Pt educated on labor process, preferences discussed. Pt is comfortable after epidural placement, continuing with position changes to facilitate labor.  Will continue to monitor and communicate with team.

## 2018-04-29 NOTE — Progress Notes (Signed)
Labor Progress Note  Debbie Mueller is a 37 y.o. G3P2002 at [redacted]w[redacted]d  admitted for active labor  S: Doing well. Feeling a lot of pressure. Denies PIH symptoms (HA, scotomata, RUQ pain).  O:  BP (!) 140/94   Pulse (!) 107   Temp 98.9 F (37.2 C) (Oral)   Resp 18   Ht  (1.575 m)   Wt 227 lb (103 kg)   LMP 08/19/2017 (Approximate)   SpO2 99%   BMI 41.52 kg/m   No intake/output data recorded.  FHT:  FHR: 125 bpm, variability: moderate,  accelerations:  Present,  decelerations:  Present variable UC:   regular, every 1.5-3 minutes SVE:   Dilation: 7 Effacement (%): 100 Station: Plus 1 Exam by:: Doroteo Glassman MD SROM: clear  Labs: Lab Results  Component Value Date   WBC 16.1 (H) 04/28/2018   HGB 12.0 04/28/2018   HCT 36.2 04/28/2018   MCV 88.9 04/28/2018   PLT 221 04/28/2018    Assessment / Plan: 37 y.o. G3P2002 [redacted]w[redacted]d in active labor Spontaneous labor, progressing normally  Labor: Progressing normally  GHTN: intrapartum diagnosis. Doing well. Asymptomatic.  Fetal Wellbeing:  Category II Pain Control:  Epidural Anticipated MOD:  NSVD  Expectant management   Caryl Ada, DO OB Fellow Center for [facility], [facility]

## 2018-04-29 NOTE — Progress Notes (Signed)
Debbie Mueller is a 37 y.o. G3P2002 at [redacted]w[redacted]d by LMP admitted for active labor  Subjective: Patient is tolerating contraction well, comfortable on epidural  Objective: BP 125/77   Pulse 99   Temp 98.9 F (37.2 C) (Oral)   Resp 18   Ht  (1.575 m)   Wt 103 kg (227 lb)   LMP 08/19/2017 (Approximate)   SpO2 99%   BMI 41.52 kg/m  No intake/output data recorded. No intake/output data recorded.  FHT:  FHR: 130 bpm, variability: marked,  accelerations:  Present,  decelerations:  Present: early UC:   irregular, every 3-6 minutes SVE:   Dilation: 7 Effacement (%): 100 Station: -1 Exam by:: Dr. Nira Retort  Labs: Lab Results  Component Value Date   WBC 16.1 (H) 04/28/2018   HGB 12.0 04/28/2018   HCT 36.2 04/28/2018   MCV 88.9 04/28/2018   PLT 221 04/28/2018    Assessment / Plan: Spontaneous labor, progressing normally  Labor: Unchanged for the past 3h, will start Pit for augmentation GHTN: intrapartum diagnosis. Doing well. Asymptomatic. Fetal Wellbeing:  Category II Pain Control:  Epidural I/D:  n/a Anticipated MOD:  NSVD  Felisa Bonier, MD, Family Medicine - [facility] [place] 04/29/2018, 9:34 AM

## 2018-04-29 NOTE — Anesthesia Postprocedure Evaluation (Signed)
Anesthesia Post Note  Patient: Debbie Mueller  Procedure(s) Performed: AN AD HOC LABOR EPIDURAL     Patient location during evaluation: Mother Baby Anesthesia Type: Epidural Level of consciousness: awake and alert Pain management: pain level controlled Vital Signs Assessment: post-procedure vital signs reviewed and stable Respiratory status: spontaneous breathing, nonlabored ventilation and respiratory function stable Cardiovascular status: stable Postop Assessment: no headache, no backache and epidural receding Anesthetic complications: no    Last Vitals:  Vitals:   04/29/18 1000 04/29/18 1030  BP: 133/76 (!) 147/80  Pulse: 99 (!) 105  Resp: 18 18  Temp:    SpO2:      Last Pain:  Vitals:   04/29/18 0900  TempSrc:   PainSc: Asleep   Pain Goal:                 [name]

## 2018-04-29 NOTE — Anesthesia Preprocedure Evaluation (Signed)
Anesthesia Evaluation  Patient identified by MRN, date of birth, ID band Patient awake    Reviewed: Allergy & Precautions, H&P , NPO status , Patient's Chart, lab work & pertinent test results  History of Anesthesia Complications Negative for: history of anesthetic complications  Airway Mallampati: II  TM Distance: >3 FB Neck ROM: full    Dental no notable dental hx. (+) Teeth Intact   Pulmonary neg pulmonary ROS, Current Smoker,    Pulmonary exam normal breath sounds clear to auscultation       Cardiovascular hypertension, Normal cardiovascular exam Rhythm:regular Rate:Normal     Neuro/Psych negative neurological ROS  negative psych ROS   GI/Hepatic negative GI ROS, Neg liver ROS,   Endo/Other  Morbid obesity  Renal/GU negative Renal ROS  negative genitourinary   Musculoskeletal   Abdominal   Peds  Hematology negative hematology ROS (+)   Anesthesia Other Findings scoliosis  Reproductive/Obstetrics (+) Pregnancy                             Anesthesia Physical Anesthesia Plan  ASA: III  Anesthesia Plan: Epidural   Post-op Pain Management:    Induction:   PONV Risk Score and Plan:   Airway Management Planned:   Additional Equipment:   Intra-op Plan:   Post-operative Plan:   Informed Consent: I have reviewed the patients History and Physical, chart, labs and discussed the procedure including the risks, benefits and alternatives for the proposed anesthesia with the patient or authorized representative who has indicated his/her understanding and acceptance.     Plan Discussed with:   Anesthesia Plan Comments:         Anesthesia Quick Evaluation

## 2018-04-29 NOTE — Progress Notes (Signed)
Faculty Note  Called to room for adherent placenta, infant delivered and cord avulsed with traction. Virtually all of placenta remained in uterus. I was able to manually extract the placenta intact and on inspection, no pieces were noted to be missing. I again explored the uterus and felt no remaining pieces of placenta or membranes. Patient given IV pitocin and 1 dose IM methergine to help with uterine contraction. She will also be given a dose of ancef. Please see delivery note for rest of details.    Baldemar Lenis, M.D. Attending Obstetrician & Gynecologist, [facility] for [employer], [facility]

## 2018-04-29 NOTE — Anesthesia Pain Management Evaluation Note (Signed)
  CRNA Pain Management Visit Note  Patient: Debbie Mueller, 37 y.o., female  "Hello I am a member of the anesthesia team at [facility]. We have an anesthesia team available at all times to provide care throughout the hospital, including epidural management and anesthesia for C-section. I don't know your plan for the delivery whether it a natural birth, water birth, IV sedation, nitrous supplementation, doula or epidural, but we want to meet your pain goals."   1.Was your pain managed to your expectations on prior hospitalizations?   Yes   2.What is your expectation for pain management during this hospitalization?     Epidural  3.How can we help you reach that goal? unsure  Record the patient's initial score and the patient's pain goal.   Pain: 10  Pain Goal: 10 [facility] wants you to be able to say your pain was always managed very well.  Cephus Shelling 04/29/2018

## 2018-04-29 NOTE — Progress Notes (Signed)
Faculty Note  Patient is a 37 y.o. [id number] who is PPD#0 s/p VBAC.  In to see patient, she again affirms desire to have bilateral tubal ligation done. She understands that this is a permanent procedure and she will not be able to have children after it is done. Reviewed risks of bilateral tubal ligation including infection, hemorrhage, damage to surrounding tissue and organs, risk of regret. Reviewed that bilateral tubal ligation is not 100% effective and she should take a pregnancy test if she believes for any reason she may be pregnant. Reviewed slightly increased risk of ectopic pregnancy and need to seek care if she becomes pregnant. She understands this is an elective procedure and again affirms her desire. Consent signed, also consents to blood transfusion if necessary.   She is NPO To OR when ready   K. Therese Sarah, M.D. Attending Obstetrician & Gynecologist, [facility] for [employer], [facility]

## 2018-04-29 NOTE — Anesthesia Procedure Notes (Signed)
Epidural Patient location during procedure: OB  Staffing Anesthesiologist: Jeptha Hinnenkamp, MD Performed: anesthesiologist   Preanesthetic Checklist Completed: patient identified, site marked, surgical consent, pre-op evaluation, timeout performed, IV checked, risks and benefits discussed and monitors and equipment checked  Epidural Patient position: sitting Prep: DuraPrep Patient monitoring: heart rate, continuous pulse ox and blood pressure Approach: right paramedian Location: L3-L4 Injection technique: LOR saline  Needle:  Needle type: Tuohy  Needle gauge: 17 G Needle length: 9 cm and 9 Needle insertion depth: 9 cm Catheter type: closed end flexible Catheter size: 20 Guage Catheter at skin depth: 13 cm Test dose: negative  Assessment Events: blood not aspirated, injection not painful, no injection resistance, negative IV test and no paresthesia  Additional Notes Patient identified. Risks/Benefits/Options discussed with patient including but not limited to bleeding, infection, nerve damage, paralysis, failed block, incomplete pain control, headache, blood pressure changes, nausea, vomiting, reactions to medication both or allergic, itching and postpartum back pain. Confirmed with bedside nurse the patient's most recent platelet count. Confirmed with patient that they are not currently taking any anticoagulation, have any bleeding history or any family history of bleeding disorders. Patient expressed understanding and wished to proceed. All questions were answered. Sterile technique was used throughout the entire procedure. Please see nursing notes for vital signs. Test dose was given through epidural needle and negative prior to continuing to dose epidural or start infusion. Warning signs of high block given to the patient including shortness of breath, tingling/numbness in hands, complete motor block, or any concerning symptoms with instructions to call for help. Patient was given  instructions on fall risk and not to get out of bed. All questions and concerns addressed with instructions to call with any issues.     

## 2018-04-29 NOTE — Progress Notes (Signed)
Epidural left in place.  Pt desires BTL for undesired fertility.  Medicaid.  Papers signed in chart.  Will complete tomorrow in the AM.  Dr Davis/Dr Degele notified.

## 2018-04-30 ENCOUNTER — Encounter (HOSPITAL_COMMUNITY)
Admission: AD | Disposition: A | Discharge status: Home / Self Care | Payer: Self-pay | Source: Home / Self Care | Attending: Obstetrics & Gynecology

## 2018-04-30 ENCOUNTER — Encounter (HOSPITAL_COMMUNITY): Payer: Self-pay | Admitting: Anesthesiology

## 2018-04-30 ENCOUNTER — Inpatient Hospital Stay (HOSPITAL_COMMUNITY): Payer: Medicaid Other | Admitting: Anesthesiology

## 2018-04-30 DIAGNOSIS — Z302 Encounter for sterilization: Secondary | ICD-10-CM

## 2018-04-30 HISTORY — PX: TUBAL LIGATION: SHX77

## 2018-04-30 LAB — CBC
HCT: 30.5 % — ABNORMAL LOW (ref 36.0–46.0)
HEMOGLOBIN: 9.6 g/dL — AB (ref 12.0–15.0)
MCH: 28.9 pg (ref 26.0–34.0)
MCHC: 31.5 g/dL (ref 30.0–36.0)
MCV: 91.9 fL (ref 78.0–100.0)
Platelets: 169 10*3/uL (ref 150–400)
RBC: 3.32 MIL/uL — AB (ref 3.87–5.11)
RDW: 16.1 % — ABNORMAL HIGH (ref 11.5–15.5)
WBC: 12 10*3/uL — ABNORMAL HIGH (ref 4.0–10.5)

## 2018-04-30 SURGERY — LIGATION, FALLOPIAN TUBE, POSTPARTUM
Anesthesia: Epidural | Laterality: Bilateral

## 2018-04-30 MED ORDER — MEPERIDINE HCL 25 MG/ML IJ SOLN: 6.2500 mg | INTRAMUSCULAR | Status: DC | PRN

## 2018-04-30 MED ORDER — METOCLOPRAMIDE HCL 5 MG/ML IJ SOLN
INTRAMUSCULAR | Status: DC | PRN
  Administered 2018-04-30: 10 mg via INTRAVENOUS

## 2018-04-30 MED ORDER — LACTATED RINGERS IV SOLN: INTRAVENOUS | Status: DC

## 2018-04-30 MED ORDER — PROPOFOL 10 MG/ML IV BOLUS
INTRAVENOUS | Status: DC | PRN
  Administered 2018-04-30: 25 mg via INTRAVENOUS
  Administered 2018-04-30: 50 mg via INTRAVENOUS
  Administered 2018-04-30 (×7): 25 mg via INTRAVENOUS

## 2018-04-30 MED ORDER — METOCLOPRAMIDE HCL 10 MG PO TABS
10.0000 mg | ORAL_TABLET | Freq: Once | ORAL | Status: AC
  Administered 2018-04-30: 10 mg via ORAL
  Filled 2018-04-30: qty 1

## 2018-04-30 MED ORDER — KETAMINE HCL 10 MG/ML IJ SOLN
INTRAMUSCULAR | Status: AC
  Filled 2018-04-30: qty 1

## 2018-04-30 MED ORDER — KETOROLAC TROMETHAMINE 30 MG/ML IJ SOLN: 30.0000 mg | Freq: Four times a day (QID) | INTRAMUSCULAR | Status: AC | PRN

## 2018-04-30 MED ORDER — SODIUM CHLORIDE 0.9% FLUSH: 3.0000 mL | INTRAVENOUS | Status: DC | PRN

## 2018-04-30 MED ORDER — MIDAZOLAM HCL 2 MG/2ML IJ SOLN
INTRAMUSCULAR | Status: AC
  Filled 2018-04-30: qty 2

## 2018-04-30 MED ORDER — NALOXONE HCL 4 MG/10ML IJ SOLN
1.0000 ug/kg/h | INTRAVENOUS | Status: DC | PRN
  Filled 2018-04-30: qty 5

## 2018-04-30 MED ORDER — NALBUPHINE HCL 10 MG/ML IJ SOLN: 5.0000 mg | Freq: Once | INTRAMUSCULAR | Status: DC | PRN

## 2018-04-30 MED ORDER — BUPIVACAINE HCL (PF) 0.25 % IJ SOLN
INTRAMUSCULAR | Status: AC
  Filled 2018-04-30: qty 30

## 2018-04-30 MED ORDER — SCOPOLAMINE 1 MG/3DAYS TD PT72
1.0000 | MEDICATED_PATCH | Freq: Once | TRANSDERMAL | Status: DC
  Filled 2018-04-30: qty 1

## 2018-04-30 MED ORDER — PROMETHAZINE HCL 25 MG/ML IJ SOLN: 6.2500 mg | INTRAMUSCULAR | Status: DC | PRN

## 2018-04-30 MED ORDER — FENTANYL CITRATE (PF) 250 MCG/5ML IJ SOLN
INTRAMUSCULAR | Status: AC
  Filled 2018-04-30: qty 5

## 2018-04-30 MED ORDER — FENTANYL CITRATE (PF) 100 MCG/2ML IJ SOLN
INTRAMUSCULAR | Status: AC
  Filled 2018-04-30: qty 4

## 2018-04-30 MED ORDER — LACTATED RINGERS IV SOLN
INTRAVENOUS | Status: DC | PRN
  Administered 2018-04-30 (×2): via INTRAVENOUS

## 2018-04-30 MED ORDER — NALOXONE HCL 0.4 MG/ML IJ SOLN: 0.4000 mg | INTRAMUSCULAR | Status: DC | PRN

## 2018-04-30 MED ORDER — DIPHENHYDRAMINE HCL 50 MG/ML IJ SOLN: 12.5000 mg | INTRAMUSCULAR | Status: DC | PRN

## 2018-04-30 MED ORDER — FENTANYL CITRATE (PF) 100 MCG/2ML IJ SOLN: 25.0000 ug | INTRAMUSCULAR | Status: DC | PRN

## 2018-04-30 MED ORDER — NALBUPHINE HCL 10 MG/ML IJ SOLN: 5.0000 mg | INTRAMUSCULAR | Status: DC | PRN

## 2018-04-30 MED ORDER — PROPOFOL 500 MG/50ML IV EMUL
INTRAVENOUS | Status: DC | PRN
  Administered 2018-04-30: 75 ug/kg/min via INTRAVENOUS

## 2018-04-30 MED ORDER — KETAMINE HCL 10 MG/ML IJ SOLN
INTRAMUSCULAR | Status: DC | PRN
  Administered 2018-04-30: 40 mg via INTRAVENOUS

## 2018-04-30 MED ORDER — BUPIVACAINE HCL (PF) 0.25 % IJ SOLN
INTRAMUSCULAR | Status: DC | PRN
  Administered 2018-04-30: 20 mL
  Administered 2018-04-30: 10 mL

## 2018-04-30 MED ORDER — ONDANSETRON HCL 4 MG/2ML IJ SOLN: 4.0000 mg | Freq: Three times a day (TID) | INTRAMUSCULAR | Status: DC | PRN

## 2018-04-30 MED ORDER — PNEUMOCOCCAL VAC POLYVALENT 25 MCG/0.5ML IJ INJ
0.5000 mL | INJECTION | INTRAMUSCULAR | Status: AC
  Administered 2018-05-01: 0.5 mL via INTRAMUSCULAR
  Filled 2018-04-30: qty 0.5

## 2018-04-30 MED ORDER — DEXAMETHASONE SODIUM PHOSPHATE 4 MG/ML IJ SOLN
INTRAMUSCULAR | Status: DC | PRN
  Administered 2018-04-30: 4 mg via INTRAVENOUS

## 2018-04-30 MED ORDER — METOCLOPRAMIDE HCL 5 MG/ML IJ SOLN
INTRAMUSCULAR | Status: AC
  Filled 2018-04-30: qty 2

## 2018-04-30 MED ORDER — KETOROLAC TROMETHAMINE 30 MG/ML IJ SOLN
INTRAMUSCULAR | Status: DC | PRN
  Administered 2018-04-30: 30 mg via INTRAVENOUS

## 2018-04-30 MED ORDER — FENTANYL CITRATE (PF) 100 MCG/2ML IJ SOLN
INTRAMUSCULAR | Status: DC | PRN
  Administered 2018-04-30: 100 ug via EPIDURAL
  Administered 2018-04-30 (×5): 50 ug via INTRAVENOUS

## 2018-04-30 MED ORDER — SODIUM BICARBONATE 8.4 % IV SOLN
INTRAVENOUS | Status: DC | PRN
  Administered 2018-04-30: 10 mL via EPIDURAL
  Administered 2018-04-30: 3 mL via EPIDURAL
  Administered 2018-04-30: 7 mL via EPIDURAL

## 2018-04-30 MED ORDER — DEXAMETHASONE SODIUM PHOSPHATE 4 MG/ML IJ SOLN
INTRAMUSCULAR | Status: AC
  Filled 2018-04-30: qty 1

## 2018-04-30 MED ORDER — KETOROLAC TROMETHAMINE 30 MG/ML IJ SOLN
INTRAMUSCULAR | Status: AC
  Filled 2018-04-30: qty 1

## 2018-04-30 MED ORDER — MIDAZOLAM HCL 5 MG/5ML IJ SOLN
INTRAMUSCULAR | Status: DC | PRN
  Administered 2018-04-30 (×2): 1 mg via INTRAVENOUS

## 2018-04-30 MED ORDER — SODIUM BICARBONATE 8.4 % IV SOLN
INTRAVENOUS | Status: AC
  Filled 2018-04-30: qty 50

## 2018-04-30 MED ORDER — LIDOCAINE-EPINEPHRINE (PF) 2 %-1:200000 IJ SOLN
INTRAMUSCULAR | Status: AC
  Filled 2018-04-30: qty 20

## 2018-04-30 MED ORDER — FAMOTIDINE 20 MG PO TABS
40.0000 mg | ORAL_TABLET | Freq: Once | ORAL | Status: AC
  Administered 2018-04-30: 40 mg via ORAL
  Filled 2018-04-30: qty 2

## 2018-04-30 MED ORDER — DIPHENHYDRAMINE HCL 25 MG PO CAPS: 25.0000 mg | ORAL_CAPSULE | ORAL | Status: DC | PRN

## 2018-04-30 MED ORDER — ONDANSETRON HCL 4 MG/2ML IJ SOLN
INTRAMUSCULAR | Status: AC
  Filled 2018-04-30: qty 2

## 2018-04-30 SURGICAL SUPPLY — 27 items
CATH ROBINSON RED A/P 14FR (CATHETERS) ×3 IMPLANT
COVER LIGHT HANDLE  1/PK (MISCELLANEOUS) ×2
COVER LIGHT HANDLE 1/PK (MISCELLANEOUS) ×1 IMPLANT
DRSG OPSITE 4X5.5 SM (GAUZE/BANDAGES/DRESSINGS) ×3 IMPLANT
DRSG OPSITE POSTOP 3X4 (GAUZE/BANDAGES/DRESSINGS) ×3 IMPLANT
DURAPREP 26ML APPLICATOR (WOUND CARE) ×3 IMPLANT
GLOVE BIOGEL PI IND STRL 6.5 (GLOVE) ×1 IMPLANT
GLOVE BIOGEL PI IND STRL 7.0 (GLOVE) ×1 IMPLANT
GLOVE BIOGEL PI INDICATOR 6.5 (GLOVE) ×2
GLOVE BIOGEL PI INDICATOR 7.0 (GLOVE) ×2
GLOVE SURG SS PI 6.0 STRL IVOR (GLOVE) ×3 IMPLANT
GOWN STRL REUS W/TWL LRG LVL3 (GOWN DISPOSABLE) ×6 IMPLANT
NEEDLE HYPO 22GX1.5 SAFETY (NEEDLE) IMPLANT
NS IRRIG 1000ML POUR BTL (IV SOLUTION) ×3 IMPLANT
PACK ABDOMINAL MINOR (CUSTOM PROCEDURE TRAY) ×3 IMPLANT
PROTECTOR NERVE ULNAR (MISCELLANEOUS) ×3 IMPLANT
SPONGE GAUZE 2X2 8PLY STER LF (GAUZE/BANDAGES/DRESSINGS) ×1
SPONGE GAUZE 2X2 8PLY STRL LF (GAUZE/BANDAGES/DRESSINGS) ×2 IMPLANT
SPONGE LAP 4X18 X RAY DECT (DISPOSABLE) IMPLANT
SUT PLAIN 0 NONE (SUTURE) ×3 IMPLANT
SUT VIC AB 0 CT1 27 (SUTURE) ×2
SUT VIC AB 0 CT1 27XBRD ANBCTR (SUTURE) ×1 IMPLANT
SUT VIC AB 3-0 PS2 18 (SUTURE) ×3 IMPLANT
SYR CONTROL 10ML LL (SYRINGE) IMPLANT
TOWEL OR 17X24 6PK STRL BLUE (TOWEL DISPOSABLE) ×6 IMPLANT
TRAY FOLEY CATH SILVER 14FR (SET/KITS/TRAYS/PACK) ×3 IMPLANT
WATER STERILE IRR 1000ML POUR (IV SOLUTION) ×3 IMPLANT

## 2018-04-30 NOTE — Transfer of Care (Signed)
Immediate Anesthesia Transfer of Care Note  Patient: Debbie Mueller  Procedure(s) Performed: POST PARTUM TUBAL LIGATION (Bilateral )  Patient Location: PACU  Anesthesia Type:MAC and Epidural  Level of Consciousness: awake, alert  and oriented  Airway & Oxygen Therapy: Patient Spontanous Breathing  Post-op Assessment: Report given to RN and Post -op Vital signs reviewed and stable  Post vital signs: Reviewed and stable  Last Vitals:  Vitals Value Taken Time  BP 97/67 04/30/2018  1:30 PM  Temp    Pulse 100 04/30/2018  1:33 PM  Resp 15 04/30/2018  1:33 PM  SpO2 93 % 04/30/2018  1:33 PM  Vitals shown include unvalidated device data.  Last Pain:  Vitals:   04/30/18 1315  TempSrc:   PainSc: 0-No pain      Patients Stated Pain Goal: 3 (28/63/81 7711)  Complications: No apparent anesthesia complications

## 2018-04-30 NOTE — Progress Notes (Signed)
Patient ID: Debbie Mueller, female   DOB: 01-May-1981, 37 y.o.   MRN: [id number]  37 y.o. yo G34P2002  with undesired fertility,status post vaginal delivery, desires permanent sterilization. Risks and benefits of procedure discussed with patient including permanence of method, bleeding, infection, injury to surrounding organs and need for additional procedures. Risk of failure of 0.5-1% with increased risk of ectopic gestation if pregnancy occurs was also discussed with patient.

## 2018-04-30 NOTE — Anesthesia Postprocedure Evaluation (Signed)
Anesthesia Post Note  Patient: Debbie Mueller  Procedure(s) Performed: POST PARTUM TUBAL LIGATION (Bilateral )     Patient location during evaluation: PACU Anesthesia Type: Epidural Level of consciousness: oriented and awake and alert Pain management: pain level controlled Vital Signs Assessment: post-procedure vital signs reviewed and stable Respiratory status: spontaneous breathing, respiratory function stable and patient connected to nasal cannula oxygen Cardiovascular status: blood pressure returned to baseline and stable Postop Assessment: no headache, no backache, no apparent nausea or vomiting, epidural receding and patient able to bend at knees Anesthetic complications: no    Last Vitals:  Vitals:   04/30/18 1358 04/30/18 1500  BP: 117/77 100/74  Pulse: 92 89  Resp: 18 18  Temp:  36.8 C  SpO2: 96% 97%    Last Pain:  Vitals:   04/30/18 1500  TempSrc: Oral  PainSc:    Pain Goal: Patients Stated Pain Goal: 3 (04/29/18 1305)               Shelton Silvas

## 2018-04-30 NOTE — Anesthesia Preprocedure Evaluation (Addendum)
Anesthesia Evaluation  Patient identified by MRN, date of birth, ID band Patient awake    Reviewed: Allergy & Precautions, NPO status , Patient's Chart, lab work & pertinent test results  Airway Mallampati: II  TM Distance: >3 FB Neck ROM: Full    Dental  (+) Upper Dentures, Lower Dentures   Pulmonary Current Smoker,    Pulmonary exam normal        Cardiovascular negative cardio ROS   Rhythm:Regular Rate:Normal     Neuro/Psych PSYCHIATRIC DISORDERS Anxiety Depression  Neuromuscular disease    GI/Hepatic Neg liver ROS, GERD  ,  Endo/Other  negative endocrine ROS  Renal/GU negative Renal ROS     Musculoskeletal negative musculoskeletal ROS (+)   Abdominal (+) + obese,   Peds  Hematology negative hematology ROS (+)   Anesthesia Other Findings   Reproductive/Obstetrics negative OB ROS                            Anesthesia Physical Anesthesia Plan  ASA: III  Anesthesia Plan: Epidural   Post-op Pain Management:    Induction: Intravenous  PONV Risk Score and Plan: Propofol infusion and Ondansetron  Airway Management Planned: Simple Face Mask  Additional Equipment: None  Intra-op Plan:   Post-operative Plan:   Informed Consent: I have reviewed the patients History and Physical, chart, labs and discussed the procedure including the risks, benefits and alternatives for the proposed anesthesia with the patient or authorized representative who has indicated his/her understanding and acceptance.     Plan Discussed with: CRNA  Anesthesia Plan Comments:        Anesthesia Quick Evaluation

## 2018-04-30 NOTE — Anesthesia Procedure Notes (Signed)
Procedure Name: MAC Date/Time: 04/30/2018 11:52 AM Performed by: Flossie Dibble, CRNA Pre-anesthesia Checklist: Patient identified, Timeout performed, Emergency Drugs available and Suction available Patient Re-evaluated:Patient Re-evaluated prior to induction Oxygen Delivery Method: Circle system utilized and Simple face mask Preoxygenation: Pre-oxygenation with 100% oxygen

## 2018-04-30 NOTE — Op Note (Signed)
PREOPERATIVE DIAGNOSIS:  Undesired fertility  POSTOPERATIVE DIAGNOSIS:  Undesired fertility  PROCEDURE:  Postpartum Bilateral Tubal Sterilization using Pomeroy method   ANESTHESIA:  Epidural  COMPLICATIONS:  None immediate.  ESTIMATED BLOOD LOSS:  Less than 20cc.  FLUIDS: 1600 cc LR.   INDICATIONS: 37 y.o. yo [id number]  with undesired fertility,status post vaginal delivery, desires permanent sterilization. Risks and benefits of procedure discussed with patient including permanence of method, bleeding, infection, injury to surrounding organs and need for additional procedures. Risk failure of 0.5-1% with increased risk of ectopic gestation if pregnancy occurs was also discussed with patient.   FINDINGS:  Normal uterus, tubes, and ovaries.  TECHNIQUE: After informed consent was obtained, the patient was taken to the operating room where anesthesia was induced and found to be adequate. A small transverse, infraumbilical skin incision was made with the scalpel. This incision was carried down to the underlying layer of fascia. The fascia was grasped with Kocher clamps tented up and entered sharply with Mayo scissors. Underlying peritoneum was then identified tented up and entered sharply with Metzenbaum scissors. The fascia was tagged with 0 Vicryl. The patient's left fallopian tube was then identified, brought to the incision, and grasped with a Babcock clamp. The tube was then followed out to the fimbria. The Babcock clamp was then used to grasp the tube approximately 4 cm from the cornual region. A 3 cm segment of the tube was then ligated with free tie of plain gut suture, transected and excised. Good hemostasis was noted and the tube was returned to the abdomen. The right fallopian tube was then identified to its fimbriated end, ligated, and a 3 cm segment excised in a similar fashion. Excellent hemostasis was noted, and the tube returned to the abdomen. The fascia was re-approximated with 0 Vicryl.  The skin was closed in a subcuticular fashion with 3-0 Vicryl. Quarter percent Marcaine solution was then injected at the incision site. The patient tolerated the procedure well. Sponge, lap, and needle count were correct x2. The patient was taken to recovery room in stable condition.

## 2018-04-30 NOTE — Progress Notes (Signed)
CSW attempted to meet with MOB to offer support and complete assessment due to [place] Postnatal Depression Scale score of 16 and hx of PMADs, Anxiety and Depression, but OR called that they were ready for her BTL at this time.  CSW will attempt to see at a later time.

## 2018-04-30 NOTE — Progress Notes (Addendum)
POSTPARTUM PROGRESS NOTE  Post Partum Day 1 Subjective:  Debbie Mueller is a 37 y.o. [id number] [redacted]w[redacted]d s/p VBAC complicated by manual extraction of placenta.  No acute events overnight.  Pt denies problems with ambulating, voiding or po intake.  She denies nausea or vomiting.  Pain is well controlled.  She has had flatus. She has not had bowel movement.  Lochia Minimal. Pt denies SOB, CP, HA, dizziness.  Objective: Blood pressure 104/68, pulse 97, temperature 99.2 F (37.3 C), temperature source Oral, resp. rate 20, height  (1.575 m), weight 227 lb (103 kg), last menstrual period 08/19/2017, SpO2 97 %.  Physical Exam:  General: alert, cooperative and no distress Lochia:normal flow Chest: CTAB Heart: RRR no m/r/g Abdomen: +BS, soft, nontender,  Uterine Fundus: firm, non-tender DVT Evaluation: No calf swelling or tenderness Extremities: no evidence of LE edema  Recent Labs    04/28/18 2304  HGB 12.0  HCT 36.2    Assessment/Plan:  ASSESSMENT: Debbie Mueller is a 37 y.o. [id number] [redacted]w[redacted]d s/p VBAC complicated by manual extraction of placenta. Patient stable and doing well overall.    Plan for discharge tomorrow Bottle feeding without issue Contraception : BTL today 5/31 at 1200 Circumcision: Planning to do outpatient GHTN: recent blood pressures WNL   LOS: 1 day   [name], Medical Student 04/30/2018, 9:27 AM   OB FELLOW MEDICAL STUDENT NOTE ATTESTATION I confirm that I have verified the information documented in the medical student's note and that I have also personally performed the physical exam and all medical decision making activities.  Patient doing well PPD#1 and denies any concerns.  NPO for BTL later today BP wnl Routine PP care  Frederik Pear, MD OB Fellow 10:58 AM

## 2018-05-01 IMAGING — DX DG ABDOMEN 2V
2 series · 2 of 2 positions shown · non-contrast
Comparison: CT 07/02/2011

CLINICAL DATA: Upper abdominal pain for 1 hour.

EXAM:
ABDOMEN - 2 VIEW

[abdomen erect]
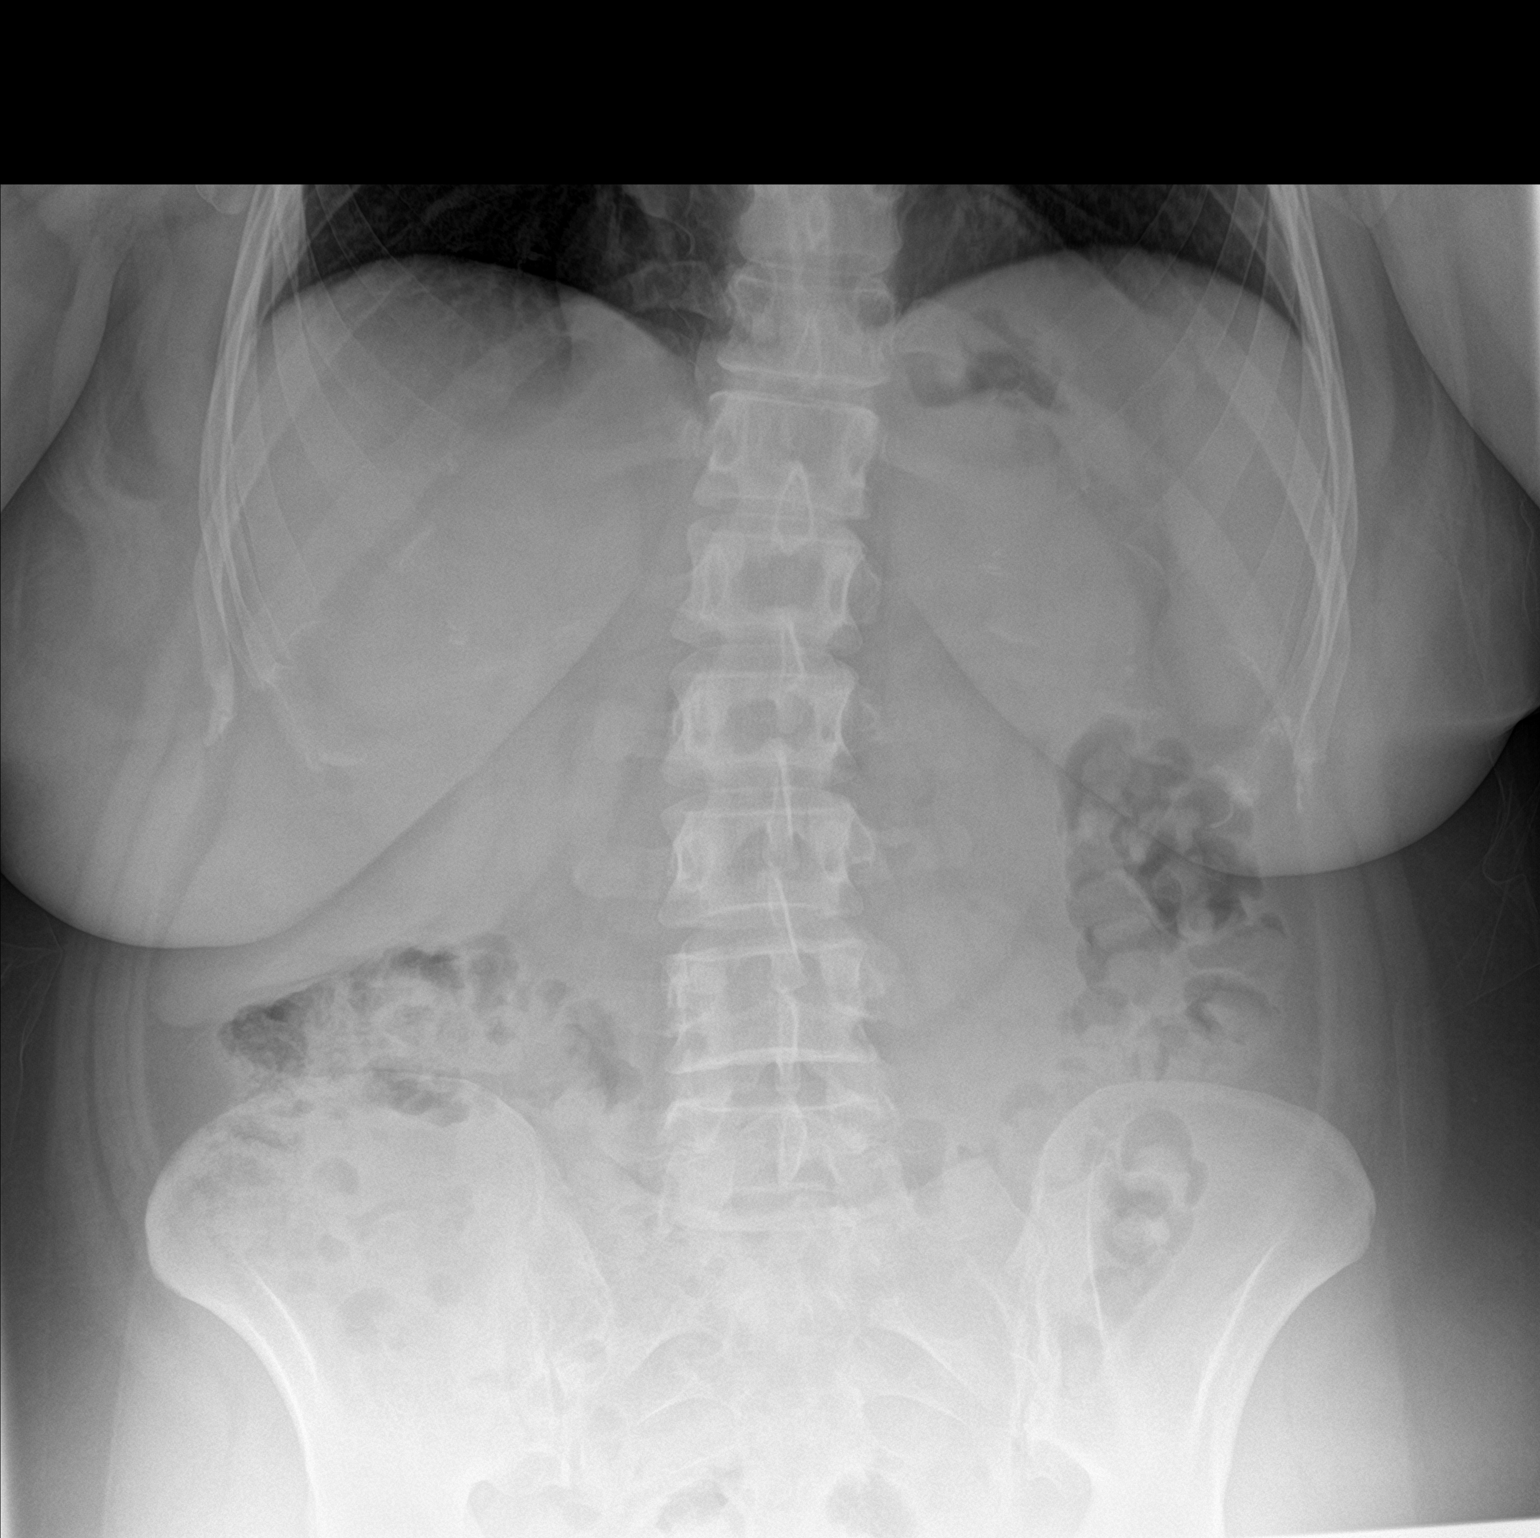

[abdomen supine]
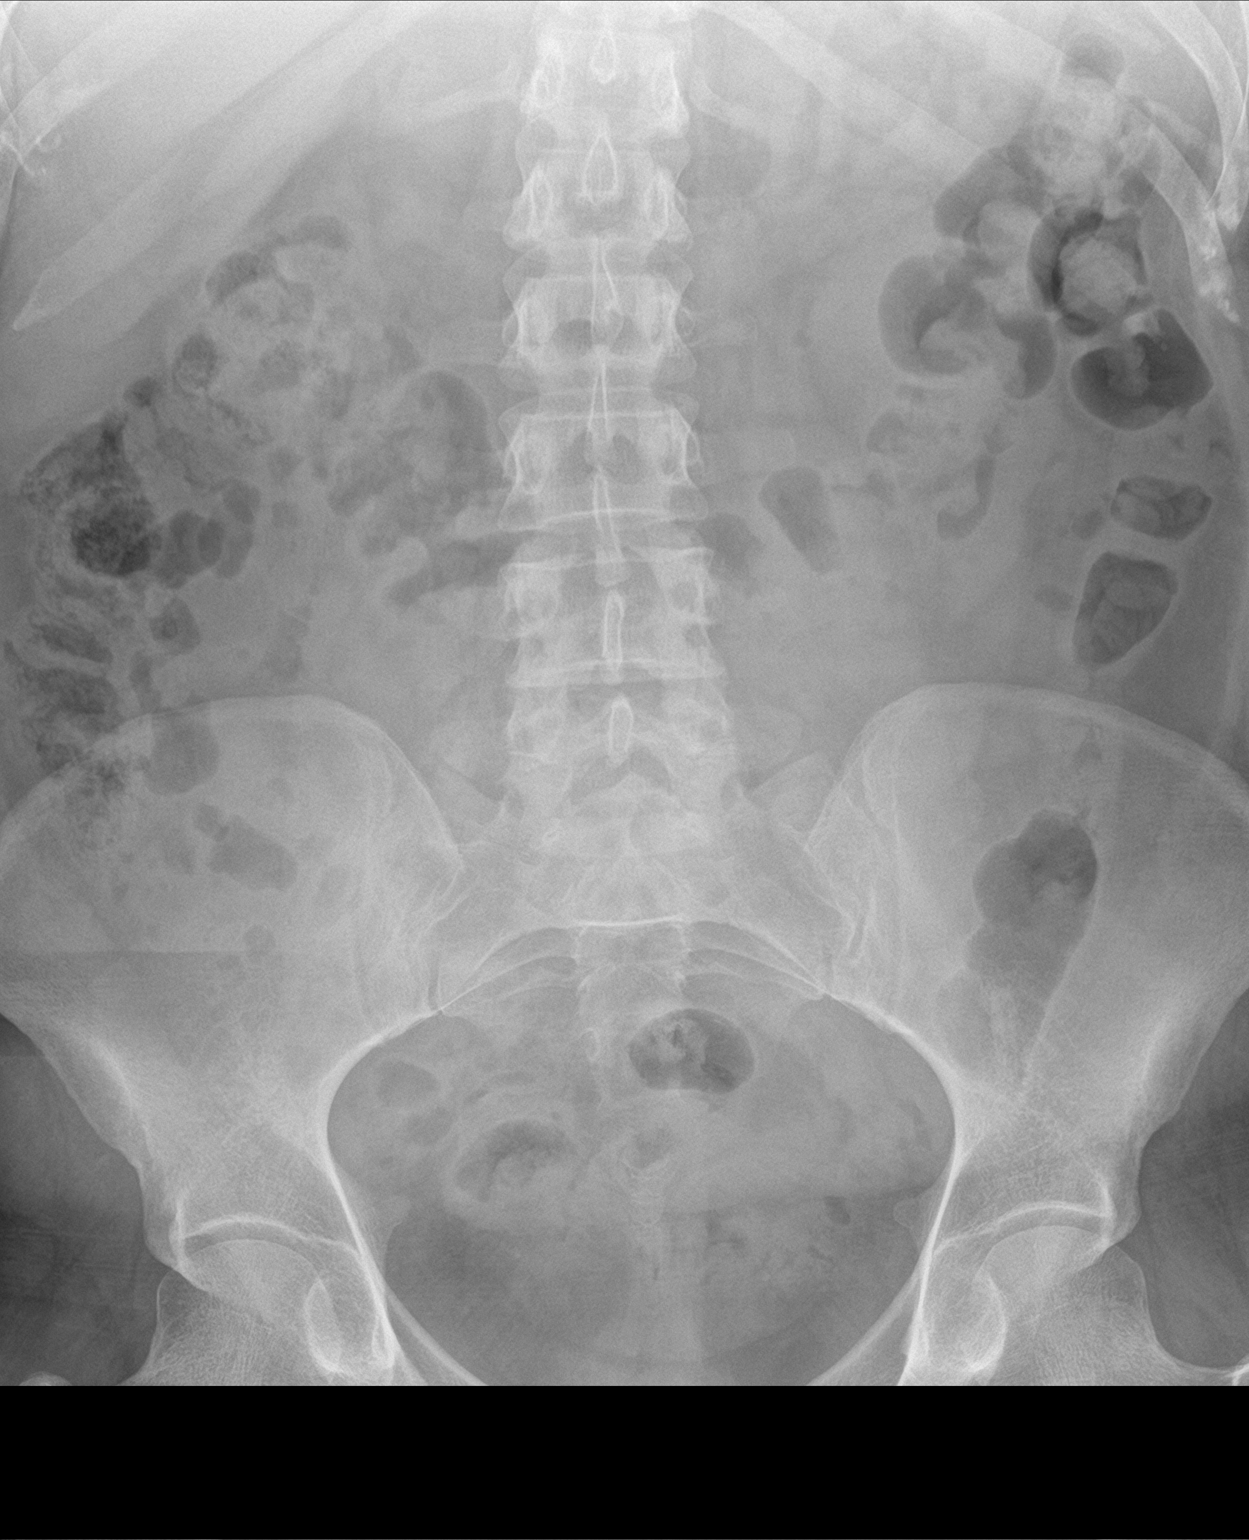

[2 of 2 positions shown; findings below may reference images not displayed]

FINDINGS: The lung bases are clear. There is no free intra-abdominal air. No
dilated bowel loops to suggest obstruction. Small-moderate volume of
stool throughout the colon. No radiopaque calculi. No acute osseous
abnormalities are seen.
IMPRESSION: Normal abdominal radiographs.

## 2018-05-01 MED ORDER — FERROUS SULFATE 325 (65 FE) MG PO TABS: 325.0000 mg | ORAL_TABLET | Freq: Two times a day (BID) | ORAL | 0 refills | Status: DC

## 2018-05-01 MED ORDER — OXYCODONE-ACETAMINOPHEN 5-325 MG PO TABS: 1.0000 | ORAL_TABLET | ORAL | 0 refills | Status: DC | PRN

## 2018-05-01 MED ORDER — IBUPROFEN 600 MG PO TABS: 600.0000 mg | ORAL_TABLET | Freq: Four times a day (QID) | ORAL | 0 refills | Status: DC

## 2018-05-01 MED ORDER — PNEUMOCOCCAL VAC POLYVALENT 25 MCG/0.5ML IJ INJ: 0.5000 mL | INJECTION | INTRAMUSCULAR | 0 refills | Status: AC

## 2018-05-01 MED ORDER — MEASLES, MUMPS & RUBELLA VAC ~~LOC~~ INJ: 0.5000 mL | INJECTION | Freq: Once | SUBCUTANEOUS | 0 refills | Status: AC

## 2018-05-01 NOTE — Discharge Instructions (Signed)
Vaginal Delivery, Care After °Refer to this sheet in the next few weeks. These instructions provide you with information about caring for yourself after vaginal delivery. Your health care provider may also give you more specific instructions. Your treatment has been planned according to current medical practices, but problems sometimes occur. Call your health care provider if you have any problems or questions. °What can I expect after the procedure? °After vaginal delivery, it is common to have: °· Some bleeding from your vagina. °· Soreness in your abdomen, your vagina, and the area of skin between your vaginal opening and your anus (perineum). °· Pelvic cramps. °· Fatigue. ° °Follow these instructions at home: °Medicines °· Take over-the-counter and prescription medicines only as told by your health care provider. °· If you were prescribed an antibiotic medicine, take it as told by your health care provider. Do not stop taking the antibiotic until it is finished. °Driving ° °· Do not drive or operate heavy machinery while taking prescription pain medicine. °· Do not drive for 24 hours if you received a sedative. °Lifestyle °· Do not drink alcohol. This is especially important if you are breastfeeding or taking medicine to relieve pain. °· Do not use tobacco products, including cigarettes, chewing tobacco, or e-cigarettes. If you need help quitting, ask your health care provider. °Eating and drinking °· Drink at least 8 eight-ounce glasses of water every day unless you are told not to by your health care provider. If you choose to breastfeed your baby, you may need to drink more water than this. °· Eat high-fiber foods every day. These foods may help prevent or relieve constipation. High-fiber foods include: °? Whole grain cereals and breads. °? Brown rice. °? Beans. °? Fresh fruits and vegetables. °Activity °· Return to your normal activities as told by your health care provider. Ask your health care provider  what activities are safe for you. °· Rest as much as possible. Try to rest or take a nap when your baby is sleeping. °· Do not lift anything that is heavier than your baby or 10 lb (4.5 kg) until your health care provider says that it is safe. °· Talk with your health care provider about when you can engage in sexual activity. This may depend on your: °? Risk of infection. °? Rate of healing. °? Comfort and desire to engage in sexual activity. °Vaginal Care °· If you have an episiotomy or a vaginal tear, check the area every day for signs of infection. Check for: °? More redness, swelling, or pain. °? More fluid or blood. °? Warmth. °? Pus or a bad smell. °· Do not use tampons or douches until your health care provider says this is safe. °· Watch for any blood clots that may pass from your vagina. These may look like clumps of dark red, brown, or black discharge. °General instructions °· Keep your perineum clean and dry as told by your health care provider. °· Wear loose, comfortable clothing. °· Wipe from front to back when you use the toilet. °· Ask your health care provider if you can shower or take a bath. If you had an episiotomy or a perineal tear during labor and delivery, your health care provider may tell you not to take baths for a certain length of time. °· Wear a bra that supports your breasts and fits you well. °· If possible, have someone help you with household activities and help care for your baby for at least a few days after   you leave the hospital. °· Keep all follow-up visits for you and your baby as told by your health care provider. This is important. °Contact a health care provider if: °· You have: °? Vaginal discharge that has a bad smell. °? Difficulty urinating. °? Pain when urinating. °? A sudden increase or decrease in the frequency of your bowel movements. °? More redness, swelling, or pain around your episiotomy or vaginal tear. °? More fluid or blood coming from your episiotomy or  vaginal tear. °? Pus or a bad smell coming from your episiotomy or vaginal tear. °? A fever. °? A rash. °? Little or no interest in activities you used to enjoy. °? Questions about caring for yourself or your baby. °· Your episiotomy or vaginal tear feels warm to the touch. °· Your episiotomy or vaginal tear is separating or does not appear to be healing. °· Your breasts are painful, hard, or turn red. °· You feel unusually sad or worried. °· You feel nauseous or you vomit. °· You pass large blood clots from your vagina. If you pass a blood clot from your vagina, save it to show to your health care provider. Do not flush blood clots down the toilet without having your health care provider look at them. °· You urinate more than usual. °· You are dizzy or light-headed. °· You have not breastfed at all and you have not had a menstrual period for 12 weeks after delivery. °· You have stopped breastfeeding and you have not had a menstrual period for 12 weeks after you stopped breastfeeding. °Get help right away if: °· You have: °? Pain that does not go away or does not get better with medicine. °? Chest pain. °? Difficulty breathing. °? Blurred vision or spots in your vision. °? Thoughts about hurting yourself or your baby. °· You develop pain in your abdomen or in one of your legs. °· You develop a severe headache. °· You faint. °· You bleed from your vagina so much that you fill two sanitary pads in one hour. °This information is not intended to replace advice given to you by your health care provider. Make sure you discuss any questions you have with your health care provider. °Document Released: 11/14/2000 Document Revised: 04/30/2016 Document Reviewed: 12/02/2015 °Elsevier Interactive Patient Education © 2018 [employer] ° ° °Postpartum Tubal Ligation, Care After °Refer to this sheet in the next few weeks. These instructions provide you with information about caring for yourself after your procedure. Your health  care provider may also give you more specific instructions. Your treatment has been planned according to current medical practices, but problems sometimes occur. Call your health care provider if you have any problems or questions after your procedure. °What can I expect after the procedure? °After the procedure, it is common to have: °· A sore throat. °· Bruising or pain in your back. °· Nausea or vomiting. °· Dizziness. °· Mild abdominal discomfort or pain, such as cramping, gas pain, or feeling bloated. °· Soreness where the incision was made. °· Tiredness. °· Pain in your shoulders. ° °Follow these instructions at home: °Medicines °· Take over-the-counter and prescription medicines only as told by your health care provider. °· Do not take aspirin because it can cause bleeding. °· Do not drive or operate heavy machinery while taking prescription pain medicine. °Activity °· Rest for the rest of the day. °· Gradually return to your normal activities over the next few days. °· Do not have sex, douche,   or put a tampon or anything else in your vagina for 6 weeks or as long as told by your health care provider. °· Do not lift anything that is heavier than your baby for 2 weeks or as long as told by your health care provider. °Incision care °· Follow instructions from your health care provider about how to take care of your incision. Make sure you: °? Wash your hands with soap and water before you change your bandage (dressing). If soap and water are not available, use hand sanitizer. °? Change your dressing as told by your health care provider. °? Leave stitches (sutures) in place. They may need to stay in place for 2 weeks or longer. °· Check your incision area every day for signs of infection. Check for: °? More redness, swelling, or pain. °? More fluid or blood. °? Warmth. °? Pus or a bad smell. °Other Instructions °· Do not take baths, swim, or use a hot tub until your health care provider approves. You may take  showers. °· Keep all follow-up visits as told by your health care provider. This is important. °Contact a health care provider if: °· You have more redness, swelling, or pain around your incision. °· Your incision feels warm to the touch. °· You have pus or a bad smell coming from your incision. °· The edges of your incision break open after the sutures have been removed. °· Your pain does not improve after 2-3 days. °· You have a rash. °· You repeatedly become dizzy or lightheaded. °· Your pain medicine is not helping. °· You are constipated. °Get help right away if: °· You have a fever. °· You faint. °· You have pain in your abdomen that gets worse. °· You have fluid or blood coming from your sutures. °· You have shortness of breath or difficulty breathing. °· You have chest pain or leg pain. °· You have ongoing nausea or diarrhea. °This information is not intended to replace advice given to you by your health care provider. Make sure you discuss any questions you have with your health care provider. °Document Released: 05/18/2012 Document Revised: 04/21/2016 Document Reviewed: 10/28/2015 °Elsevier Interactive Patient Education © 2018 Elsevier Inc. ° °

## 2018-05-01 NOTE — Progress Notes (Signed)
CSW received consult for patient due to EPDS score of sixteen and history of postpartum depression. CSW met with patient, newborn, and FOB Elberta Fortis at bedside. CSW obtained permission from patient to discuss needs with FOB present. FOB was holding infant upon CSW arrival. Patient and CSW discussed her mental health history including anxiety and depression, and postpartum depression as well. Patient and CSW discussed EPDS of 16, patient stated that she just has been feeling overwhelmed since delivering but it was not concerning to her. CSW inquired with patient about answering yes to question 10 regarding thoughts of self harm, patient stated that was a mistake and she did not mean to check yes. CSW attempted to normalize situation and affirm with patient that checking yes on that question is nothing to be ashamed off, patient reiterated that it was a mistake. Patient attends counseling at [facility] in [place]. Patient was formerly on Paxil, Klonapin, and Celexa but is not on any medications currently. Patient and CSW discussed reaching out to counseling center or CSW department for assistance if needs or questions arise regarding mental health, patient stated she felt comfortable doing either or. Patient did not have additional questions for CSW.  Madilyn Fireman, MSW, [place] Social Worker [facility] [phone]

## 2018-05-01 NOTE — Anesthesia Postprocedure Evaluation (Signed)
Anesthesia Post Note  Patient: Dava NajjarHeather N Freundlich  Procedure(s) Performed: AN AD HOC LABOR EPIDURAL     Patient location during evaluation: Mother Baby Anesthesia Type: Epidural Level of consciousness: awake and alert and oriented Pain management: satisfactory to patient Vital Signs Assessment: post-procedure vital signs reviewed and stable Respiratory status: respiratory function stable Cardiovascular status: stable Postop Assessment: no headache, no backache, epidural receding, patient able to bend at knees, no signs of nausea or vomiting and adequate PO intake Anesthetic complications: no    Last Vitals:  Vitals:   05/01/18 0513 05/01/18 1005  BP: 103/63 115/70  Pulse: 82 86  Resp: 18 18  Temp: 36.8 C 36.5 C  SpO2: 98%     Last Pain:  Vitals:   05/01/18 1005  TempSrc: Oral  PainSc: 2    Pain Goal: Patients Stated Pain Goal: 2 (05/01/18 1005)               Tesneem Dufrane

## 2018-05-01 NOTE — Discharge Summary (Signed)
OB Discharge Summary     Patient Name: Debbie Mueller DOB: 23-Nov-1981 MRN: [id number]  Date of admission: 04/28/2018 Delivering MD: Mallie Snooks C   Date of discharge: 05/01/2018  Admitting diagnosis: LABOR CHECK Intrauterine pregnancy: [redacted]w[redacted]d    Secondary diagnosis:  Active Problems:   Labor and delivery, indication for care   SVD (spontaneous vaginal delivery)   Post-dates pregnancy  Additional problems:  H/o PP Depression     Discharge diagnosis: Term Pregnancy Delivered, VBAC and Gestational Hypertension                                                              Post partum procedures:postpartum tubal ligation  Augmentation: Pitocin  Complications: Cord avulsion requiring manual extaction of placenta, no retained products observed  Hospital course:  Onset of Labor With Vaginal Delivery     37y.o. yo G3P2002 at 444w5das admitted in Active Labor on 04/28/2018. Patient had an uncomplicated labor course as follows:  Membrane Rupture Time/Date: 11:50 PM ,04/28/2018   Intrapartum Procedures: Episiotomy: None [1]                                         Lacerations:  Cervical [7]  Patient had a delivery of a Viable infant. 04/29/2018  Information for the patient's newborn:  MoKamee, Bobst0[[id number]]Delivery Method: VBAC, Spontaneous(Filed from Delivery Summary)    Pateint had an uncomplicated postpartum course.  She is ambulating, tolerating a regular diet, passing flatus, and urinating well. Patient is discharged home in stable condition on 05/01/18.   Physical exam  Vitals:   04/30/18 1836 04/30/18 2120 05/01/18 0240 05/01/18 0513  BP: 110/82 (!) 110/59 117/75 103/63  Pulse: 85 73 89 82  Resp: 18 18 18 18   Temp: 98.5 F (36.9 C) 98.8 F (37.1 C) 98.8 F (37.1 C) 98.3 F (36.8 C)  TempSrc: Oral Oral Oral Oral  SpO2:  98% 99% 98%  Weight:      Height:       General: alert, cooperative and no distress Lochia: appropriate Uterine Fundus:  firm Incision: Healing well with no significant drainage, No significant erythema, Dressing is clean, dry, and intact DVT Evaluation: No evidence of DVT seen on physical exam. Labs: Lab Results  Component Value Date   WBC 12.0 (H) 04/30/2018   HGB 9.6 (L) 04/30/2018   HCT 30.5 (L) 04/30/2018   MCV 91.9 04/30/2018   PLT 169 04/30/2018   CMP Latest Ref Rng & Units 04/28/2018  Glucose 65 - 99 mg/dL 109(H)  BUN 6 - 20 mg/dL 6  Creatinine 0.44 - 1.00 mg/dL 0.51  Sodium 135 - 145 mmol/L 131(L)  Potassium 3.5 - 5.1 mmol/L 3.6  Chloride 101 - 111 mmol/L 101  CO2 22 - 32 mmol/L 18(L)  Calcium 8.9 - 10.3 mg/dL 9.0  Total Protein 6.5 - 8.1 g/dL 7.0  Total Bilirubin 0.3 - 1.2 mg/dL 0.5  Alkaline Phos 38 - 126 U/L 90  AST 15 - 41 U/L 21  ALT 14 - 54 U/L 12(L)    Discharge instruction: per After Visit Summary and "Baby and Me Booklet".  After visit meds:  Allergies as  of 05/01/2018      Reactions   Penicillins Rash      Medication List    STOP taking these medications   Doxylamine-Pyridoxine ER 20-20 MG Tbcr Commonly known as:  BONJESTA   TUMS PO     TAKE these medications   ferrous sulfate 325 (65 FE) MG tablet Take 1 tablet (325 mg total) by mouth 2 (two) times daily with a meal.   ibuprofen 600 MG tablet Commonly known as:  ADVIL,MOTRIN Take 1 tablet (600 mg total) by mouth every 6 (six) hours.   measles, mumps and rubella vaccine injection Commonly known as:  MMR Inject 0.5 mLs into the skin once for 1 dose.   oxyCODONE-acetaminophen 5-325 MG tablet Commonly known as:  PERCOCET/ROXICET Take 1 tablet by mouth every 4 (four) hours as needed (pain scale 4-7).   pneumococcal 23 valent vaccine 25 MCG/0.5ML injection Commonly known as:  PNU-IMMUNE Inject 0.5 mLs into the muscle tomorrow at 10 am for 1 dose.   PRENATAL VITAMIN PO Take by mouth daily.       Diet: routine diet  Activity: Advance as tolerated. Pelvic rest for 6 weeks.   Outpatient follow up:1 week  BP check, 2 week incision, 4 week PP check Follow up Appt: Future Appointments  Date Time Provider [place]  06/02/2018 11:00 AM Cresenzo-Dishmon, Joaquim Lai, CNM [facility]   Follow up Visit:No follow-ups on file.  Postpartum contraception: Tubal Ligation  Newborn Data: Live born female  Birth Weight: 7 lb 11.1 oz (3490 g) APGAR: 56, 9  Newborn Delivery   Birth date/time:  04/29/2018 10:50:00 Delivery type:  VBAC, Spontaneous     Baby Feeding: Bottle Disposition:home with mother   05/01/2018 Bonnita Hollow, MD

## 2018-05-03 ENCOUNTER — Encounter (HOSPITAL_COMMUNITY): Payer: Self-pay

## 2018-05-03 ENCOUNTER — Encounter: Payer: Medicaid Other | Admitting: Obstetrics & Gynecology

## 2018-05-04 ENCOUNTER — Inpatient Hospital Stay (HOSPITAL_COMMUNITY): Admission: RE | Admit: 2018-05-04 | Payer: Medicaid Other | Source: Ambulatory Visit

## 2018-05-06 ENCOUNTER — Encounter: Payer: Medicaid Other | Admitting: Obstetrics & Gynecology

## 2018-06-02 ENCOUNTER — Ambulatory Visit: Payer: Medicaid Other | Admitting: *Deleted

## 2018-06-02 ENCOUNTER — Encounter: Payer: Self-pay | Admitting: *Deleted

## 2018-06-02 VITALS — BP 115/79 | HR 95

## 2018-06-02 DIAGNOSIS — Z0130 Encounter for examination of blood pressure without abnormal findings: Secondary | ICD-10-CM

## 2018-06-02 MED ORDER — IBUPROFEN 600 MG PO TABS: 600.0000 mg | ORAL_TABLET | Freq: Four times a day (QID) | ORAL | 0 refills | Status: DC

## 2018-06-02 NOTE — Progress Notes (Signed)
Pt in for bp check. BP reviewed by Victorino DikeJennifer. Patient also requesting refill on her ibuprofen. [facility] Patient has no further concerns at this time. Will followup for PP visit.

## 2018-06-08 ENCOUNTER — Encounter: Payer: Self-pay | Admitting: *Deleted

## 2018-06-10 ENCOUNTER — Ambulatory Visit: Payer: Medicaid Other | Admitting: Women's Health

## 2018-06-10 ENCOUNTER — Encounter: Payer: Medicaid Other | Admitting: Obstetrics & Gynecology

## 2018-06-17 ENCOUNTER — Ambulatory Visit: Payer: Medicaid Other | Admitting: Women's Health

## 2018-06-23 ENCOUNTER — Encounter: Payer: Self-pay | Admitting: Women's Health

## 2018-06-23 ENCOUNTER — Ambulatory Visit (INDEPENDENT_AMBULATORY_CARE_PROVIDER_SITE_OTHER): Payer: Medicaid Other | Admitting: Women's Health

## 2018-06-23 DIAGNOSIS — Z113 Encounter for screening for infections with a predominantly sexual mode of transmission: Secondary | ICD-10-CM | POA: Diagnosis not present

## 2018-06-23 DIAGNOSIS — O99345 Other mental disorders complicating the puerperium: Secondary | ICD-10-CM

## 2018-06-23 DIAGNOSIS — N898 Other specified noninflammatory disorders of vagina: Secondary | ICD-10-CM

## 2018-06-23 DIAGNOSIS — F53 Postpartum depression: Secondary | ICD-10-CM | POA: Insufficient documentation

## 2018-06-23 DIAGNOSIS — R319 Hematuria, unspecified: Secondary | ICD-10-CM

## 2018-06-23 DIAGNOSIS — Z98891 History of uterine scar from previous surgery: Secondary | ICD-10-CM

## 2018-06-23 LAB — POCT URINALYSIS DIPSTICK OB
GLUCOSE, UA: NEGATIVE — AB
Ketones, UA: NEGATIVE
LEUKOCYTES UA: NEGATIVE
Nitrite, UA: NEGATIVE
POC,PROTEIN,UA: NEGATIVE

## 2018-06-23 LAB — POCT WET PREP (WET MOUNT)
CLUE CELLS WET PREP WHIFF POC: NEGATIVE
TRICHOMONAS WET PREP HPF POC: ABSENT

## 2018-06-23 MED ORDER — ESCITALOPRAM OXALATE 10 MG PO TABS: 10.0000 mg | ORAL_TABLET | Freq: Every day | ORAL | 6 refills | Status: DC

## 2018-06-23 MED ORDER — NYSTATIN 100000 UNIT/GM EX OINT
1.0000 | TOPICAL_OINTMENT | Freq: Two times a day (BID) | CUTANEOUS | 0 refills | Status: DC
Start: 2018-06-23 — End: 2019-01-07

## 2018-06-23 MED ORDER — TRIAMCINOLONE ACETONIDE 0.1 % EX OINT: 1.0000 "application " | TOPICAL_OINTMENT | Freq: Two times a day (BID) | CUTANEOUS | 0 refills | Status: DC

## 2018-06-23 NOTE — Progress Notes (Signed)
POSTPARTUM VISIT Patient name: Debbie NajjarHeather N Mueller MRN [id number]  Date of birth: 04/15/1981 Chief Complaint:   postpartum visit (BP check; feels depressed; vaginal itching; vaginal discharge; odor with urine; vaginal dryness  )  History of Present Illness:   Debbie Mueller is a 37 y.o. [id number] Caucasian female being seen today for a postpartum visit. She is 7 weeks postpartum following a vaginal birth after cesarean (VBAC) at 40.3 gestational weeks, GHTN dx on admission. Had inpatient BTL. Anesthesia: epidural. I have fully reviewed the prenatal and intrapartum course. Pregnancy uncomplicated. Postpartum course has been uncomplicated. Bleeding thin lochia. Bowel function is some constipation. Bladder function is normal.  Reports vulvovaginal itching x 1wk Patient is sexually active. Last sexual activity: last week.  Contraception method is tubal ligation.  [place] Postpartum Depression Screening: positive. Score 20. Has h/o PPD needing meds. Denies SI/HI/II. Decreased appetite, not sleeping well, doesn't find joy in things she used to. Wants to re-start counseling- [facility]   Last pap 10/02/17.  Results were ASCUS w/ -HRHPV .  Patient's last menstrual period was 06/16/2018 (approximate).  Baby's course has been uncomplicated. Baby is feeding by bottle.  Review of Systems:   Pertinent items are noted in HPI Denies Abnormal vaginal discharge w/ itching/odor/irritation, headaches, visual changes, shortness of breath, chest pain, abdominal pain, severe nausea/vomiting, or problems with urination or bowel movements. Pertinent History Reviewed:  Reviewed past medical,surgical, obstetrical and family history.  Reviewed problem list, medications and allergies. OB History  Gravida Para Term Preterm AB Living  3 3 3     3   SAB TAB Ectopic Multiple Live Births          3    # Outcome Date GA Lbr Len/2nd Weight Sex Delivery Anes PTL Lv  3 Term 01/25/10 [id number]  7 lb 8 oz  (3.402 kg) F CS-Unspec Spinal N LIV     Birth Comments: breech  2 Term 03/29/03 422w0d  8 lb 7 oz (3.827 kg) M Vag-Spont EPI N LIV  1 Term     M Vag-Spont   LIV   Physical Assessment:   Vitals:   06/23/18 1416  BP: 119/82  Pulse: 91  Weight: 200 lb (90.7 kg)  Height: 5\' 2"  (1.575 m)  Body mass index is 36.58 kg/m.       Physical Examination:   General appearance: alert, well appearing, and in no distress  Mental status: alert, oriented to person, place, and time  Skin: warm & dry   Cardiovascular: normal heart rate noted   Respiratory: normal respiratory effort, no distress   Breasts: deferred, no complaints   Abdomen: soft, non-tender   Pelvic: VULVA: normal appearing vulva with no masses, tenderness or lesions, VAGINA: normal appearing vagina with normal color and discharge, no lesions, CERVIX: normal appearing cervix without discharge or lesions, UTERUS: uterus is normal size, shape, consistency and nontender  Rectal: small non-thrombosed hemorrhoids  Extremities: no edema       Results for orders placed or performed in visit on 06/23/18 (from the past 24 hour(s))  POC Urinalysis Dipstick OB   Collection Time: 06/23/18  2:24 PM  Result Value Ref Range   Color, UA     Clarity, UA     Glucose, UA Negative (A) (none)   Bilirubin, UA     Ketones, UA neg    Spec Grav, UA  1.010 - 1.025   Blood, UA trace    pH, UA  5.0 - 8.0  POC Protein UA Negative Negative, Trace   Urobilinogen, UA  0.2 or 1.0 E.U./dL   Nitrite, UA neg    Leukocytes, UA Negative Negative   Appearance     Odor    POCT Wet Prep [facility])   Collection Time: 06/23/18  2:57 PM  Result Value Ref Range   Source Wet Prep POC vaginal    WBC, Wet Prep HPF POC few    Bacteria Wet Prep HPF POC Few Few   BACTERIA WET PREP MORPHOLOGY POC     Clue Cells Wet Prep HPF POC None None   Clue Cells Wet Prep Whiff POC Negative Whiff    Yeast Wet Prep HPF POC None None   KOH Wet Prep POC  None   Trichomonas Wet  Prep HPF POC Absent Absent    Assessment & Plan:  1) Postpartum exam 2) 7 wks s/p SVB, intrapartum GHTN 3) Bottlefeeding 4) Depression screening 5) S/P BTL 6) PPD> rx Lexapro 10mg , understands takes few weeks of taking daily to notice improvement. Pt to [facility] to schedule appt as she is already established there, if any problems, let us know 7) Constipation> gave printed prevention/relief measures  8) Hemorrhoids> Preparation H 9) Vulvovaginal itching> Rx mycolog as 2 separate ointments (triamcinolone 0.1% ointment and nystatin 100,000 units ointment). Send gc/ct  Meds:  Meds ordered this encounter  Medications  . escitalopram (LEXAPRO) 10 MG tablet    Sig: Take 1 tablet (10 mg total) by mouth daily.    Dispense:  30 tablet    Refill:  6    Order Specific Question:   Supervising Provider    Answer:   Despina Hidden, LUTHER H [[id number]]  . nystatin ointment (MYCOSTATIN)    Sig: Apply 1 application topically 2 (two) times daily.    Dispense:  30 g    Refill:  0    Order Specific Question:   Supervising Provider    Answer:   Despina Hidden, LUTHER H [[id number]]  . triamcinolone ointment (KENALOG) 0.1 %    Sig: Apply 1 application topically 2 (two) times daily.    Dispense:  30 g    Refill:  0    Order Specific Question:   Supervising Provider    Answer:   Lazaro Arms [[id number]]    Follow-up: Return in about 1 month (around 07/21/2018) for F/U.   Orders Placed This Encounter  Procedures  . GC/Chlamydia Probe Amp  . POC Urinalysis Dipstick OB  . POCT Wet Prep [facility])    Cheral Marker CNM, [facility] 06/23/2018 2:57 PM

## 2018-06-23 NOTE — Patient Instructions (Addendum)
Call [facility] for an appointment, if you are unable to get one, call us and let us know  Preparation H for hemorrhoids Constipation  Drink plenty of fluid, preferably water, throughout the day  Eat foods high in fiber such as fruits, vegetables, and grains  Exercise, such as walking, is a good way to keep your bowels regular  Drink warm fluids, especially warm prune juice, or decaf coffee  Eat a 1/2 cup of real oatmeal (not instant), 1/2 cup applesauce, and 1/2-1 cup warm prune juice every day  If needed, you may take Colace (docusate sodium) stool softener once or twice a day to help keep the stool soft. If you are pregnant, wait until you are out of your first trimester (12-14 weeks of pregnancy)  If you still are having problems with constipation, you may take Miralax once daily as needed to help keep your bowels regular.  If you are pregnant, wait until you are out of your first trimester (12-14 weeks of pregnancy)     Perinatal Depression When a woman feels excessive sadness, anger, or anxiety during pregnancy or during the first 12 months after she gives birth, she has a condition called perinatal depression. Depression can interfere with work, school, relationships, and other everyday activities. If it is not managed properly, it can also cause problems in the mother and her baby. Sometimes, perinatal depression is left untreated because symptoms are thought to be normal mood swings during and right after pregnancy. If you have symptoms of depression, it is important to talk with your health care provider. What are the causes? The exact cause of this condition is not known. Hormonal changes during and after pregnancy may play a role in causing perinatal depression. What increases the risk? You are more likely to develop this condition if:  You have a personal or family history of depression, anxiety, or mood disorders.  You experience a stressful life event during pregnancy,  such as the death of a loved one.  You have a lot of regular life stress.  You do not have support from family members or loved ones, or you are in an abusive relationship.  What are the signs or symptoms? Symptoms of this condition include:  Feeling sad or hopeless.  Feelings of guilt.  Feeling irritable or overwhelmed.  Changes in your appetite.  Lack of energy or motivation.  Sleep problems.  Difficulty concentrating or completing tasks.  Loss of interest in hobbies or relationships.  Headaches or stomach problems that do not go away.  How is this diagnosed? This condition is diagnosed based on a physical exam and mental evaluation. In some cases, your health care provider may use a depression screening tool. These tools include a list of questions that can help a health care provider diagnose depression. Your health care provider may refer you to a mental health expert who specializes in depression. How is this treated? This condition may be treated with:  Medicines. Your health care provider will only give you medicines that have been proven safe for pregnancy and breastfeeding.  Talk therapy with a mental health professional to help change your patterns of thinking (cognitive behavioral therapy).  Support groups.  Brain stimulation or light therapies.  Stress reduction therapies, such as mindfulness.  Follow these instructions at home: Lifestyle  Do not use any products that contain nicotine or tobacco, such as cigarettes and e-cigarettes. If you need help quitting, ask your health care provider.  Do not use alcohol when  you are pregnant. After your baby is born, limit alcohol intake to no more than 1 drink a day. One drink equals 12 oz of beer, 5 oz of wine, or 1 oz of hard liquor.  Consider joining a support group for new mothers. Ask your health care provider for recommendations.  Take good care of yourself. Make sure you: ? Get plenty of sleep. If you  are having trouble sleeping, talk with your health care provider. ? Eat a healthy diet. This includes plenty of fruits and vegetables, whole grains, and lean proteins. ? Exercise regularly, as told by your health care provider. Ask your health care provider what exercises are safe for you. General instructions  Take over-the-counter and prescription medicines only as told by your health care provider.  Talk with your partner or family members about your feelings during pregnancy. Share any concerns or anxieties that you may have.  Ask for help with tasks or chores when you need it. Ask friends and family members to provide meals, watch your children, or help with cleaning.  Keep all follow-up visits as told by your health care provider. This is important. Contact a health care provider if:  You (or people close to you) notice that you have any symptoms of depression.  You have depression and your symptoms get worse.  You experience side effects from medicines, such as nausea or sleep problems. Get help right away if:  You feel like hurting yourself, your baby, or someone else. If you ever feel like you may hurt yourself or others, or have thoughts about taking your own life, get help right away. You can go to your nearest emergency department or call:  Your local emergency services (911 in the [place]  A suicide crisis helpline, such as the National Suicide Prevention Lifeline at 308-033-78631-(314)424-0240. This is open 24 hours a day.  Summary  Perinatal depression is when a woman feels excessive sadness, anger, or anxiety during pregnancy or during the first 12 months after she gives birth.  If perinatal depression is not treated, it can lead to health problems for the mother and her baby.  This condition is treated with medicines, talk therapy, stress reduction therapies, or a combination of two or more treatments.  Talk with your partner or family members about your feelings. Do not be  afraid to ask for help. This information is not intended to replace advice given to you by your health care provider. Make sure you discuss any questions you have with your health care provider. Document Released: 01/14/2017 Document Revised: 01/14/2017 Document Reviewed: 01/14/2017 Elsevier Interactive Patient Education  [employer]2018 [employer]vier Inc.

## 2018-06-26 LAB — GC/CHLAMYDIA PROBE AMP
Chlamydia trachomatis, NAA: NEGATIVE
Neisseria gonorrhoeae by PCR: NEGATIVE

## 2018-07-21 ENCOUNTER — Encounter: Payer: Self-pay | Admitting: *Deleted

## 2018-07-21 ENCOUNTER — Ambulatory Visit: Payer: Medicaid Other | Admitting: Women's Health

## 2019-01-07 ENCOUNTER — Encounter (HOSPITAL_COMMUNITY): Payer: Self-pay | Admitting: Emergency Medicine

## 2019-01-07 ENCOUNTER — Other Ambulatory Visit: Payer: Self-pay

## 2019-01-07 ENCOUNTER — Emergency Department (HOSPITAL_COMMUNITY)
Admission: EM | Admit: 2019-01-07 | Discharge: 2019-01-07 | Disposition: A | Discharge status: Other Acute Inpatient Hospital | Payer: Medicaid Other | Attending: Emergency Medicine | Admitting: Emergency Medicine

## 2019-01-07 ENCOUNTER — Inpatient Hospital Stay
Admission: AD | Admit: 2019-01-07 | Discharge: 2019-01-11 | DRG: 885 | Disposition: A | Discharge status: Home / Self Care | Payer: Medicaid Other | Source: Intra-hospital | Attending: Psychiatry | Admitting: Psychiatry

## 2019-01-07 DIAGNOSIS — Y9389 Activity, other specified: Secondary | ICD-10-CM | POA: Insufficient documentation

## 2019-01-07 DIAGNOSIS — Z915 Personal history of self-harm: Secondary | ICD-10-CM

## 2019-01-07 DIAGNOSIS — K219 Gastro-esophageal reflux disease without esophagitis: Secondary | ICD-10-CM | POA: Diagnosis present

## 2019-01-07 DIAGNOSIS — F410 Panic disorder [episodic paroxysmal anxiety] without agoraphobia: Secondary | ICD-10-CM | POA: Diagnosis present

## 2019-01-07 DIAGNOSIS — Y929 Unspecified place or not applicable: Secondary | ICD-10-CM | POA: Diagnosis not present

## 2019-01-07 DIAGNOSIS — F17210 Nicotine dependence, cigarettes, uncomplicated: Secondary | ICD-10-CM | POA: Diagnosis present

## 2019-01-07 DIAGNOSIS — F17200 Nicotine dependence, unspecified, uncomplicated: Secondary | ICD-10-CM

## 2019-01-07 DIAGNOSIS — F332 Major depressive disorder, recurrent severe without psychotic features: Principal | ICD-10-CM | POA: Diagnosis present

## 2019-01-07 DIAGNOSIS — F1420 Cocaine dependence, uncomplicated: Secondary | ICD-10-CM | POA: Diagnosis present

## 2019-01-07 DIAGNOSIS — Z9119 Patient's noncompliance with other medical treatment and regimen: Secondary | ICD-10-CM

## 2019-01-07 DIAGNOSIS — Z818 Family history of other mental and behavioral disorders: Secondary | ICD-10-CM

## 2019-01-07 DIAGNOSIS — X838XXA Intentional self-harm by other specified means, initial encounter: Secondary | ICD-10-CM | POA: Diagnosis not present

## 2019-01-07 DIAGNOSIS — T1491XA Suicide attempt, initial encounter: Secondary | ICD-10-CM | POA: Insufficient documentation

## 2019-01-07 DIAGNOSIS — N3281 Overactive bladder: Secondary | ICD-10-CM | POA: Diagnosis present

## 2019-01-07 DIAGNOSIS — Z62810 Personal history of physical and sexual abuse in childhood: Secondary | ICD-10-CM | POA: Diagnosis present

## 2019-01-07 DIAGNOSIS — M419 Scoliosis, unspecified: Secondary | ICD-10-CM | POA: Diagnosis present

## 2019-01-07 DIAGNOSIS — Z23 Encounter for immunization: Secondary | ICD-10-CM | POA: Diagnosis not present

## 2019-01-07 DIAGNOSIS — Y999 Unspecified external cause status: Secondary | ICD-10-CM | POA: Diagnosis not present

## 2019-01-07 DIAGNOSIS — F1220 Cannabis dependence, uncomplicated: Secondary | ICD-10-CM | POA: Diagnosis present

## 2019-01-07 DIAGNOSIS — R41843 Psychomotor deficit: Secondary | ICD-10-CM | POA: Diagnosis present

## 2019-01-07 DIAGNOSIS — R4587 Impulsiveness: Secondary | ICD-10-CM | POA: Diagnosis present

## 2019-01-07 DIAGNOSIS — F4010 Social phobia, unspecified: Secondary | ICD-10-CM | POA: Diagnosis present

## 2019-01-07 DIAGNOSIS — F4310 Post-traumatic stress disorder, unspecified: Secondary | ICD-10-CM | POA: Diagnosis present

## 2019-01-07 DIAGNOSIS — Z79899 Other long term (current) drug therapy: Secondary | ICD-10-CM | POA: Insufficient documentation

## 2019-01-07 DIAGNOSIS — Z9851 Tubal ligation status: Secondary | ICD-10-CM

## 2019-01-07 DIAGNOSIS — G4700 Insomnia, unspecified: Secondary | ICD-10-CM | POA: Diagnosis present

## 2019-01-07 HISTORY — DX: Anxiety disorder, unspecified: F41.9

## 2019-01-07 HISTORY — DX: Panic disorder (episodic paroxysmal anxiety): F41.0

## 2019-01-07 HISTORY — DX: Post-traumatic stress disorder, unspecified: F43.10

## 2019-01-07 LAB — RAPID URINE DRUG SCREEN, HOSP PERFORMED
Amphetamines: NOT DETECTED
BENZODIAZEPINES: NOT DETECTED
Barbiturates: NOT DETECTED
Cocaine: POSITIVE — AB
Opiates: NOT DETECTED
Tetrahydrocannabinol: POSITIVE — AB

## 2019-01-07 LAB — CBC WITH DIFFERENTIAL/PLATELET
ABS IMMATURE GRANULOCYTES: 0.05 10*3/uL (ref 0.00–0.07)
BASOS ABS: 0.1 10*3/uL (ref 0.0–0.1)
Basophils Relative: 1 %
Eosinophils Absolute: 1.3 10*3/uL — ABNORMAL HIGH (ref 0.0–0.5)
Eosinophils Relative: 9 %
HCT: 39.2 % (ref 36.0–46.0)
HEMOGLOBIN: 12.4 g/dL (ref 12.0–15.0)
IMMATURE GRANULOCYTES: 0 %
LYMPHS PCT: 19 %
Lymphs Abs: 2.8 10*3/uL (ref 0.7–4.0)
MCH: 27.6 pg (ref 26.0–34.0)
MCHC: 31.6 g/dL (ref 30.0–36.0)
MCV: 87.1 fL (ref 80.0–100.0)
Monocytes Absolute: 0.6 10*3/uL (ref 0.1–1.0)
Monocytes Relative: 4 %
NEUTROS ABS: 9.5 10*3/uL — AB (ref 1.7–7.7)
NRBC: 0 % (ref 0.0–0.2)
Neutrophils Relative %: 67 %
Platelets: 324 10*3/uL (ref 150–400)
RBC: 4.5 MIL/uL (ref 3.87–5.11)
RDW: 14.1 % (ref 11.5–15.5)
WBC: 14.4 10*3/uL — AB (ref 4.0–10.5)

## 2019-01-07 LAB — URINALYSIS, ROUTINE W REFLEX MICROSCOPIC
Bilirubin Urine: NEGATIVE
Glucose, UA: NEGATIVE mg/dL
KETONES UR: NEGATIVE mg/dL
Nitrite: NEGATIVE
PROTEIN: NEGATIVE mg/dL
Specific Gravity, Urine: 1.012 (ref 1.005–1.030)
pH: 5 (ref 5.0–8.0)

## 2019-01-07 LAB — COMPREHENSIVE METABOLIC PANEL
ALBUMIN: 3.2 g/dL — AB (ref 3.5–5.0)
ALK PHOS: 58 U/L (ref 38–126)
ALT: 11 U/L (ref 0–44)
ANION GAP: 6 (ref 5–15)
AST: 12 U/L — AB (ref 15–41)
BUN: 7 mg/dL (ref 6–20)
CALCIUM: 8.2 mg/dL — AB (ref 8.9–10.3)
CO2: 22 mmol/L (ref 22–32)
Chloride: 111 mmol/L (ref 98–111)
Creatinine, Ser: 0.62 mg/dL (ref 0.44–1.00)
GFR calc Af Amer: >60 mL/min (ref >60)
GFR calc non Af Amer: >60 mL/min (ref >60)
GLUCOSE: 112 mg/dL — AB (ref 70–99)
Potassium: 3.4 mmol/L — ABNORMAL LOW (ref 3.5–5.1)
SODIUM: 139 mmol/L (ref 135–145)
Total Bilirubin: 0.4 mg/dL (ref 0.3–1.2)
Total Protein: 5.5 g/dL — ABNORMAL LOW (ref 6.5–8.1)

## 2019-01-07 LAB — ACETAMINOPHEN LEVEL
Acetaminophen (Tylenol), Serum: <10 ug/mL — ABNORMAL LOW (ref 10–30)
Acetaminophen (Tylenol), Serum: <10 ug/mL — ABNORMAL LOW (ref 10–30)

## 2019-01-07 LAB — SALICYLATE LEVEL

## 2019-01-07 LAB — ETHANOL: Alcohol, Ethyl (B): <10 mg/dL (ref <10)

## 2019-01-07 LAB — PREGNANCY, URINE: Preg Test, Ur: NEGATIVE

## 2019-01-07 MED ORDER — IBUPROFEN 400 MG PO TABS: 600.0000 mg | ORAL_TABLET | Freq: Three times a day (TID) | ORAL | Status: DC | PRN

## 2019-01-07 MED ORDER — ONDANSETRON HCL 4 MG PO TABS: 4.0000 mg | ORAL_TABLET | Freq: Three times a day (TID) | ORAL | Status: DC | PRN

## 2019-01-07 MED ORDER — LORAZEPAM 1 MG PO TABS
1.0000 mg | ORAL_TABLET | Freq: Four times a day (QID) | ORAL | Status: DC | PRN
  Administered 2019-01-07: 1 mg via ORAL
  Filled 2019-01-07: qty 1

## 2019-01-07 MED ORDER — NICOTINE 21 MG/24HR TD PT24
21.0000 mg | MEDICATED_PATCH | Freq: Once | TRANSDERMAL | Status: DC
  Administered 2019-01-07: 21 mg via TRANSDERMAL
  Filled 2019-01-07: qty 1

## 2019-01-07 NOTE — ED Notes (Signed)
Pt reports to RN that she wants help with mood swings also in inpatient treatment. Pt reports for a little over a month she has been having trouble with "mood swings and throwing things". Pt denies SI/HI at this time.

## 2019-01-07 NOTE — ED Notes (Signed)
Pt requesting medication for anxiety. EDP notified 

## 2019-01-07 NOTE — ED Notes (Signed)
Poison Control advised to monitor pt for 4-6 hours, perform repeat tylenol level at 4 hours and peform an EKG.  Dr Manus Gunningancour notified,

## 2019-01-07 NOTE — ED Notes (Signed)
Pt wearing coat and shoes upon d/c. Other belonging sent with driver

## 2019-01-07 NOTE — ED Provider Notes (Signed)
[facility] EMERGENCY DEPARTMENT Provider Note   CSN: [id number] Arrival date & time: 01/07/19  0359     History   Chief Complaint Chief Complaint  Patient presents with  . V70.1    HPI Debbie Mueller is a 38 y.o. female.  Patient reports intentional overdose of 7 tablets of 10 mg Lexapro about 3 AM.  She states this was a suicide attempt after she had an argument with her boyfriend.  The Lexapro was her medication.  She denies any Tylenol or other ingestions.  No alcohol.  Continues to feel suicidal.  No homicidal ideation or hallucinations.  Denies any other regular medications.  Denies any chest pain, shortness of breath, abdominal pain, nausea vomiting.  The history is provided by the patient and the EMS personnel.    Past Medical History:  Diagnosis Date  . Acid reflux   . Anxiety   . Anxiety disorder 2015  . Depression    PP depression  . Loss of teeth due to gum disease    wears dentures  . Overactive bladder   . Panic attacks 2015  . PTSD (post-traumatic stress disorder) 2015  . Scoliosis   . Vitamin D deficiency     Patient Active Problem List   Diagnosis Date Noted  . Postpartum depression 06/23/2018  . History of cesarean delivery 10/28/2017  . Smoker 10/02/2017    Past Surgical History:  Procedure Laterality Date  . CESAREAN SECTION    . TUBAL LIGATION Bilateral 04/30/2018   Procedure: POST PARTUM TUBAL LIGATION;  Surgeon: Catalina Antigua, MD;  Location: [facility];  Service: Gynecology;  Laterality: Bilateral;     OB History    Gravida  3   Para  3   Term  3   Preterm      AB      Living  3     SAB      TAB      Ectopic      Multiple      Live Births  3            Home Medications    Prior to Admission medications   Medication Sig Start Date End Date Taking? Authorizing Provider  escitalopram (LEXAPRO) 10 MG tablet Take 1 tablet (10 mg total) by mouth daily. 06/23/18   Cheral Marker, CNM  ferrous sulfate  325 (65 FE) MG tablet Take 325 mg by mouth daily.    [provider]  ibuprofen (ADVIL,MOTRIN) 600 MG tablet Take 1 tablet (600 mg total) by mouth every 6 (six) hours. 06/02/18   Adline Potter, NP  nystatin ointment (MYCOSTATIN) Apply 1 application topically 2 (two) times daily. 06/23/18   Cheral Marker, CNM  Prenatal Vit-Fe Fumarate-FA (PRENATAL VITAMIN PO) Take by mouth daily.    [provider]  triamcinolone ointment (KENALOG) 0.1 % Apply 1 application topically 2 (two) times daily. 06/23/18   Cheral Marker, CNM    Family History Family History  Problem Relation Age of Onset  . Diabetes Paternal Grandfather   . Hypertension Paternal Grandfather   . Diabetes Paternal Grandmother   . Hypertension Paternal Grandmother   . Heart attack Maternal Grandmother   . Heart attack Maternal Grandfather   . Anemia Father   . Hypertension Father   . Breast cancer Mother   . Depression Brother   . Other Son        acid reflux    Social History Social History  Tobacco Use  . Smoking status: Current Every Day Smoker    Packs/day: 0.00    Years: 10.00    Pack years: 0.00    Types: Cigarettes  . Smokeless tobacco: Never Used  . Tobacco comment: smokes 4 cig a day  Substance Use Topics  . Alcohol use: No  . Drug use: No     Allergies   Penicillins   Review of Systems Review of Systems  Constitutional: Negative for activity change, appetite change and fever.  HENT: Negative for congestion and rhinorrhea.   Respiratory: Negative for cough, chest tightness and shortness of breath.   Cardiovascular: Negative for chest pain.  Gastrointestinal: Negative for abdominal pain, nausea and vomiting.  Genitourinary: Negative for dysuria and hematuria.  Musculoskeletal: Negative for arthralgias and myalgias.  Skin: Negative for rash.  Psychiatric/Behavioral: Positive for decreased concentration, hallucinations, self-injury and suicidal ideas. The patient is  nervous/anxious and is hyperactive.    all other systems are negative except as noted in the HPI and PMH.     Physical Exam Updated Vital Signs BP 96/60   Pulse 74   Temp 98.3 F (36.8 C) (Oral)   Resp 17   Ht 5\' 2"  (1.575 m)   Wt 86.2 kg   LMP 12/24/2018   SpO2 95%   BMI 34.75 kg/m   Physical Exam Vitals signs and nursing note reviewed.  Constitutional:      General: She is not in acute distress.    Appearance: She is well-developed.  HENT:     Head: Normocephalic and atraumatic.     Mouth/Throat:     Pharynx: No oropharyngeal exudate.  Eyes:     Conjunctiva/sclera: Conjunctivae normal.     Pupils: Pupils are equal, round, and reactive to light.  Neck:     Musculoskeletal: Normal range of motion and neck supple.     Comments: No meningismus. Cardiovascular:     Rate and Rhythm: Normal rate and regular rhythm.     Heart sounds: Normal heart sounds. No murmur.  Pulmonary:     Effort: Pulmonary effort is normal. No respiratory distress.     Breath sounds: Normal breath sounds.  Abdominal:     Palpations: Abdomen is soft.     Tenderness: There is no abdominal tenderness. There is no guarding or rebound.  Musculoskeletal: Normal range of motion.        General: No tenderness.  Skin:    General: Skin is warm.  Neurological:     General: No focal deficit present.     Mental Status: She is alert and oriented to person, place, and time. Mental status is at baseline.     Cranial Nerves: No cranial nerve deficit.     Motor: No abnormal muscle tone.     Coordination: Coordination normal.     Comments: No ataxia on finger to nose bilaterally. No pronator drift. 5/5 strength throughout. CN 2-12 intact.Equal grip strength. Sensation intact.   Psychiatric:        Behavior: Behavior normal.     Comments: Flat affect      ED Treatments / Results  Labs (all labs ordered are listed, but only abnormal results are displayed) Labs Reviewed  CBC WITH DIFFERENTIAL/PLATELET -  Abnormal; Notable for the following components:      Result Value   WBC 14.4 (*)    Neutro Abs 9.5 (*)    Eosinophils Absolute 1.3 (*)    All other components within normal limits  COMPREHENSIVE METABOLIC PANEL -  Abnormal; Notable for the following components:   Potassium 3.4 (*)    Glucose, Bld 112 (*)    Calcium 8.2 (*)    Total Protein 5.5 (*)    Albumin 3.2 (*)    AST 12 (*)    All other components within normal limits  ACETAMINOPHEN LEVEL - Abnormal; Notable for the following components:   Acetaminophen (Tylenol), Serum <10 (*)    All other components within normal limits  RAPID URINE DRUG SCREEN, HOSP PERFORMED - Abnormal; Notable for the following components:   Cocaine POSITIVE (*)    Tetrahydrocannabinol POSITIVE (*)    All other components within normal limits  ETHANOL  SALICYLATE LEVEL  PREGNANCY, URINE  URINALYSIS, ROUTINE W REFLEX MICROSCOPIC    EKG EKG Interpretation  Date/Time:  Friday January 07 2019 04:19:58 EST Ventricular Rate:  80 PR Interval:    QRS Duration: 105 QT Interval:  395 QTC Calculation: 456 R Axis:   101 Text Interpretation:  Sinus rhythm Short PR interval Borderline right axis deviation No previous ECGs available Confirmed by Glynn Octaveancour, Jeremih Dearmas [phone] on 01/07/2019 4:25:30 AM   Radiology No results found.  Procedures Procedures (including critical care time)  Medications Ordered in ED Medications - No data to display   Initial Impression / Assessment and Plan / ED Course  I have reviewed the triage vital signs and the nursing notes.  Pertinent labs & imaging results that were available during my care of the patient were reviewed by me and considered in my medical decision making (see chart for details).    Intentional ingestion of Lexapro and suicide attempt.  Vitals stable.  No distress.  EKG with sinus rhythm normal axis and intervals.  Poison center suggests patient will be medically clear after 4 hours of monitoring after  ingestion.  Recommends repeat Tylenol level at 4 hours.  Labs show leukocytosis which is chronic.  Otherwise reassuring.  Toxicology labs negative.  Patient monitored till 7 AM without any arrhythmia or deleterious effects from medications.  We will recheck acetaminophen level and after this showed medically clear for psychiatric evaluation.  Holding orders are placed. Final Clinical Impressions(s) / ED Diagnoses   Final diagnoses:  Suicide attempt [facility](HCC)    ED Discharge Orders    None       Matheau Orona, Jeannett SeniorStephen, MD 01/07/19 [phone]

## 2019-01-07 NOTE — ED Notes (Signed)
Meal tray given 
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
## 2019-01-07 NOTE — ED Notes (Signed)
Pt accepted at [facility] at 1930 tonight. Accepting MD is Dr. Jama Flavors.

## 2019-01-07 NOTE — BH Assessment (Signed)
Patient has been accepted to [facility].  Accepting physician is Dr. Jennet Maduro.  Attending Physician will be Dr. Toni Amend.  Patient has been assigned to room 305, by [facility] Charge Nurse Gigi.  Call report to [phone].  Representative/Transfer Coordinator is Anjana Cheek Patient pre-admitted by [facility] Patient Access Ethelene Browns)  [facility] Staff Maralyn Sago, TTS) made aware of acceptance.
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
## 2019-01-07 NOTE — Progress Notes (Addendum)
Per Calvin @ [facility], Pt accepted to [facility], Bed 305 Attending/accepting provider TBD Call report to [phone] Pt is Voluntary.  Pt may be transported by [facility]  Pt is scheduled to arrive at [facility] at 730pm Pt will need to sign voluntary consent for treatment form and have it faxed to [facility] at [phone] prior to transport  Carney BernJean T. Kaylyn LimSutter, MSW, LCSWA Disposition Clinical Social Work [phone] (cell) [phone] (office)

## 2019-01-07 NOTE — ED Notes (Addendum)
Pt changed into paper scrubs, personal belongings secured in locker with pt label, wanded by security at this time

## 2019-01-07 NOTE — ED Notes (Signed)
Am waiting until Acetaminophen level comes back to DC leads.
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
## 2019-01-07 NOTE — ED Notes (Signed)
[facility] behavioral health RN reports that pt cannot be accepted at their facility until 2000 tonight and report can't be called on pt until until 1900.

## 2019-01-07 NOTE — BH Assessment (Signed)
[facility] Assessment Progress Note    [facility] is reviewing patient for admission.  However, beds are not available until discharges take place.

## 2019-01-08 ENCOUNTER — Other Ambulatory Visit: Payer: Self-pay | Admitting: Psychiatry

## 2019-01-08 DIAGNOSIS — F1420 Cocaine dependence, uncomplicated: Secondary | ICD-10-CM | POA: Diagnosis present

## 2019-01-08 DIAGNOSIS — F1220 Cannabis dependence, uncomplicated: Secondary | ICD-10-CM | POA: Diagnosis present

## 2019-01-08 DIAGNOSIS — F332 Major depressive disorder, recurrent severe without psychotic features: Secondary | ICD-10-CM | POA: Diagnosis present

## 2019-01-08 DIAGNOSIS — F17200 Nicotine dependence, unspecified, uncomplicated: Secondary | ICD-10-CM

## 2019-01-08 DIAGNOSIS — F4310 Post-traumatic stress disorder, unspecified: Secondary | ICD-10-CM

## 2019-01-08 DIAGNOSIS — G4700 Insomnia, unspecified: Secondary | ICD-10-CM

## 2019-01-08 MED ORDER — NICOTINE 14 MG/24HR TD PT24
14.0000 mg | MEDICATED_PATCH | Freq: Every day | TRANSDERMAL | Status: DC
  Administered 2019-01-08 – 2019-01-11 (×3): 14 mg via TRANSDERMAL
  Filled 2019-01-08 (×3): qty 1

## 2019-01-08 MED ORDER — DIPHENHYDRAMINE HCL 25 MG PO CAPS
50.0000 mg | ORAL_CAPSULE | Freq: Three times a day (TID) | ORAL | Status: DC | PRN
  Administered 2019-01-09 – 2019-01-10 (×2): 50 mg via ORAL
  Filled 2019-01-08 (×2): qty 2

## 2019-01-08 MED ORDER — MAGNESIUM HYDROXIDE 400 MG/5ML PO SUSP: 30.0000 mL | Freq: Every day | ORAL | Status: DC | PRN

## 2019-01-08 MED ORDER — PROPRANOLOL HCL 20 MG PO TABS
10.0000 mg | ORAL_TABLET | Freq: Three times a day (TID) | ORAL | Status: DC
  Administered 2019-01-08 – 2019-01-11 (×8): 10 mg via ORAL
  Filled 2019-01-08 (×9): qty 1

## 2019-01-08 MED ORDER — ALUM & MAG HYDROXIDE-SIMETH 200-200-20 MG/5ML PO SUSP: 30.0000 mL | ORAL | Status: DC | PRN

## 2019-01-08 MED ORDER — IBUPROFEN 600 MG PO TABS: 800.0000 mg | ORAL_TABLET | Freq: Three times a day (TID) | ORAL | Status: DC | PRN

## 2019-01-08 MED ORDER — PAROXETINE HCL 20 MG PO TABS
20.0000 mg | ORAL_TABLET | Freq: Every day | ORAL | Status: DC
  Administered 2019-01-08 – 2019-01-11 (×4): 20 mg via ORAL
  Filled 2019-01-08 (×4): qty 1

## 2019-01-08 MED ORDER — ESCITALOPRAM OXALATE 10 MG PO TABS: 20.0000 mg | ORAL_TABLET | Freq: Every day | ORAL | Status: DC

## 2019-01-08 MED ORDER — TRAZODONE HCL 100 MG PO TABS: 100.0000 mg | ORAL_TABLET | Freq: Every evening | ORAL | Status: DC | PRN

## 2019-01-08 MED ORDER — DIVALPROEX SODIUM ER 500 MG PO TB24
1000.0000 mg | ORAL_TABLET | Freq: Every day | ORAL | Status: DC
  Administered 2019-01-08 – 2019-01-10 (×3): 1000 mg via ORAL
  Filled 2019-01-08 (×3): qty 2

## 2019-01-08 MED ORDER — ACETAMINOPHEN 325 MG PO TABS
650.0000 mg | ORAL_TABLET | Freq: Four times a day (QID) | ORAL | Status: DC | PRN
  Administered 2019-01-08: 650 mg via ORAL
  Filled 2019-01-08: qty 2

## 2019-01-08 MED ORDER — INFLUENZA VAC SPLIT QUAD 0.5 ML IM SUSY
0.5000 mL | PREFILLED_SYRINGE | INTRAMUSCULAR | Status: AC
  Administered 2019-01-09: 0.5 mL via INTRAMUSCULAR
  Filled 2019-01-08: qty 0.5

## 2019-01-08 MED ORDER — HYDROXYZINE HCL 50 MG PO TABS
50.0000 mg | ORAL_TABLET | Freq: Three times a day (TID) | ORAL | Status: DC | PRN
  Administered 2019-01-08: 50 mg via ORAL
  Filled 2019-01-08: qty 1
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
## 2019-01-08 NOTE — Tx Team (Signed)
Initial Treatment Plan 01/08/2019 3:13 AM Debbie Mueller GGY:[id number]    PATIENT STRESSORS: Financial difficulties Marital or family conflict Medication change or noncompliance   PATIENT STRENGTHS: Capable of independent living General fund of knowledge Motivation for treatment/growth   PATIENT IDENTIFIED PROBLEMS: Depression     Anxiety                  DISCHARGE CRITERIA:  Improved stabilization in mood, thinking, and/or behavior Motivation to continue treatment in a less acute level of care  PRELIMINARY DISCHARGE PLAN: Outpatient therapy  PATIENT/FAMILY INVOLVEMENT: This treatment plan has been presented to and reviewed with the patient, Debbie Mueller, .  The patient and family have been given the opportunity to ask questions and make suggestions.  Trula Ore, RN 01/08/2019, 3:13 AM

## 2019-01-08 NOTE — Progress Notes (Signed)
D: Pt during assessments denies SI/HI/AVH, can contract for safety. Pt is pleasant and cooperative with this Clinical research associatewriter, but a bit guarded. Pt. Endorses a depressed and anxious mood. Pt. Endorses high anxiety and moderate depression. Pt. Given PRN medication for anxiety per patient request. Pt. Affect is flat and sad with this writer, but did brighten up in the evening a bit.   A: Q x 15 minute observation checks were completed for safety. Patient was provided with education, but needs reinforcement. Patient was given/offered medications per orders. Patient  was encourage to attend groups, participate in unit activities and continue with plan of care. Pt. Chart and plans of care reviewed. Pt. Given support and encouragement.   R: Patient is complaint with medications and unit procedures. Pt. Attends meals, eating fair. Pt. Frequently isolative and withdrawn to room resting today, but observed out at times. Pt. Reports chronic pain that is being addressed utilizing MD orders. Pt. Observed during meals socializing well and affect is much brighter with peers.             Precautionary checks every 15 minutes for safety maintained, room free of safety hazards, patient sustains no injury or falls during this shift. Will endorse care to next shift.

## 2019-01-08 NOTE — Plan of Care (Signed)
Pt. Is complaint with medications. Pt. Denies si/hi/avh, can contract for safety. Pt. Reports she can remain safe while on the unit.    Problem: Health Behavior/Discharge Planning: Goal: Compliance with treatment plan for underlying cause of condition will improve Outcome: Progressing   Problem: Safety: Goal: Periods of time without injury will increase Outcome: Progressing
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
## 2019-01-08 NOTE — Progress Notes (Unsigned)
Patient complaints of weight gain, snoring, waking up short of breath with panic and sweats, and difficulty maintaining sleep. Family history of sleep apnea. Daytime symptoms of inattentiveness, tired, short tempered, forgetful, and napping.   Debbie Craft, MD

## 2019-01-08 NOTE — [facility] Group Notes (Signed)
LCSW Group Therapy Note  01/08/2019 1:15pm  Type of Therapy and Topic: Group Therapy: Holding on to Grudges   Participation Level: Did Not Attend   Description of Group:  In this group patients will be asked to explore and define a grudge. Patients will be guided to discuss their thoughts, feelings, and reasons as to why people have grudges. Patients will process the impact grudges have on daily life and identify thoughts and feelings related to holding grudges. Facilitator will challenge patients to identify ways to let go of grudges and the benefits this provides. Patients will be confronted to address why one struggles letting go of grudges. Lastly, patients will identify feelings and thoughts related to what life would look like without grudges. This group will be process-oriented, with patients participating in exploration of their own experiences, giving and receiving support, and processing challenge from other group members.  Therapeutic Goals:  1. Patient will identify specific grudges related to their personal life.  2. Patient will identify feelings, thoughts, and beliefs around grudges.  3. Patient will identify how one releases grudges appropriately.  4. Patient will identify situations where they could have let go of the grudge, but instead chose to hold on.   Summary of Patient Progress: Pt was invited to attend group but chose not to attend. CSW will continue to encourage pt to attend group throughout their admission.    Therapeutic Modalities:  Cognitive Behavioral Therapy  Solution Focused Therapy  Motivational Interviewing  Brief Therapy   Laithan Conchas  CUEBAS-COLON, LCSW 01/08/2019 9:56 AM

## 2019-01-09 DIAGNOSIS — G4700 Insomnia, unspecified: Secondary | ICD-10-CM

## 2019-01-09 DIAGNOSIS — F332 Major depressive disorder, recurrent severe without psychotic features: Secondary | ICD-10-CM

## 2019-01-09 DIAGNOSIS — F17200 Nicotine dependence, unspecified, uncomplicated: Secondary | ICD-10-CM | POA: Diagnosis not present

## 2019-01-09 DIAGNOSIS — F4310 Post-traumatic stress disorder, unspecified: Secondary | ICD-10-CM

## 2019-01-09 DIAGNOSIS — F1420 Cocaine dependence, uncomplicated: Secondary | ICD-10-CM

## 2019-01-09 DIAGNOSIS — F1220 Cannabis dependence, uncomplicated: Secondary | ICD-10-CM

## 2019-01-09 NOTE — [facility] Group Notes (Signed)
LCSW Group Therapy Note 01/09/2019 1:15pm  Type of Therapy and Topic: Group Therapy: Feelings Around Returning Home & Establishing a Supportive Framework and Supporting Oneself When Supports Not Available  Participation Level: Active  Description of Group:  Patients first processed thoughts and feelings about upcoming discharge. These included fears of upcoming changes, lack of change, new living environments, judgements and expectations from others and overall stigma of mental health issues. The group then discussed the definition of a supportive framework, what that looks and feels like, and how do to discern it from an unhealthy non-supportive network. The group identified different types of supports as well as what to do when your family/friends are less than helpful or unavailable  Therapeutic Goals  1. Patient will identify one healthy supportive network that they can use at discharge. 2. Patient will identify one factor of a supportive framework and how to tell it from an unhealthy network. 3. Patient able to identify one coping skill to use when they do not have positive supports from others. 4. Patient will demonstrate ability to communicate their needs through discussion and/or role plays.  Summary of Patient Progress:  The patient reported she feels "okay." Pt engaged during group session. As patients processed their anxiety about discharge and described healthy supports patient shared she is ready to be discharge. She stated, "I learned how to deal with my depression."  Patients identified at least one self-care tool they were willing to use after discharge.   Therapeutic Modalities Cognitive Behavioral Therapy Motivational Interviewing   Soni Kegel  CUEBAS-COLON, LCSW 01/09/2019 12:40 PM

## 2019-01-09 NOTE — Progress Notes (Signed)
[facility]  01/09/2019 1:02 PM Debbie NajjarHeather N Mueller  MRN:  [id number]  Subjective:  "I am feeling better and ready to get out of here." Principal Problem: Major depressive disorder, recurrent severe without psychotic features (HCC) Diagnosis: Principal Problem:   Major depressive disorder, recurrent severe without psychotic features (HCC) Active Problems:   Tobacco use disorder   Cocaine use disorder, moderate, dependence (HCC)   Cannabis use disorder, moderate, dependence (HCC)  Total Time spent with patient: 35 min   Past Psychiatric History: PTSD, anxiety, depression, polysubstance abuse, history of suicide attempt by cutting her wrist, and overdose.  Debbie Mueller is an 38 y.o. female who was brought to [facility] ED after ingesting 10 Lexapro pills in a suicide attempt after an argument with her boyfriend. Patient states, "he does not believe anything I say and he is always accusing me of doing things I have not done." Patient states that she has a history of depression, but states that she has been off her medications for a year and eight months. Patient had a baby last year and was off her medications while pregnant and never started them back after her delivery.  On evaluation today, patient is smiling.  She is calm and cooperative.  She reports that she slept well.  Patient has been active in the milieu and attending groups with active participation.  She is able to state 7 coping skills coping skills, and is completing a list of 30 for distraction when she is anxious, depressed, or irritable.  Patient reports after discharge she plans to seek counseling, stay on medicine.  Patient is hoping to discharge soon in order to see her children.  Patient denies suicidal ideation, plan or intent today.  She is denying any homicidal ideation or auditory and visual hallucinations.  Patient is tolerating medication changes without any noted side effects.   Past Medical History:  Past  Medical History:  Diagnosis Date  . Acid reflux   . Anxiety   . Anxiety disorder 2015  . Depression    PP depression  . Loss of teeth due to gum disease    wears dentures  . Overactive bladder   . Panic attacks 2015  . PTSD (post-traumatic stress disorder) 2015  . Scoliosis   . Vitamin D deficiency     Past Surgical History:  Procedure Laterality Date  . CESAREAN SECTION    . TUBAL LIGATION Bilateral 04/30/2018   Procedure: POST PARTUM TUBAL LIGATION;  Surgeon: Catalina Antiguaonstant, Peggy, MD;  Location: [facility];  Service: Gynecology;  Laterality: Bilateral;   Family History:  Family History  Problem Relation Age of Onset  . Diabetes Paternal Grandfather   . Hypertension Paternal Grandfather   . Diabetes Paternal Grandmother   . Hypertension Paternal Grandmother   . Heart attack Maternal Grandmother   . Heart attack Maternal Grandfather   . Anemia Father   . Hypertension Father   . Breast cancer Mother   . Depression Brother   . Other Son        acid reflux   Family Psychiatric  History: See H&P Social History:  Social History   Substance and Sexual Activity  Alcohol Use No     Social History   Substance and Sexual Activity  Drug Use No    Social History   Socioeconomic History  . Marital status: Significant Other    Spouse name: Not on file  . Number of children: Not on file  .  Years of education: Not on file  . Highest education level: Not on file  Occupational History  . Not on file  Social Needs  . Financial resource strain: Not on file  . Food insecurity:    Worry: Not on file    Inability: Not on file  . Transportation needs:    Medical: Not on file    Non-medical: Not on file  Tobacco Use  . Smoking status: Current Every Day Smoker    Packs/day: 0.50    Years: 10.00    Pack years: 5.00    Types: Cigarettes  . Smokeless tobacco: Never Used  . Tobacco comment: smokes 4 cig a day  Substance and Sexual Activity  . Alcohol use: No  . Drug  use: No  . Sexual activity: Yes    Birth control/protection: Surgical    Comment: tubal  Lifestyle  . Physical activity:    Days per week: Not on file    Minutes per session: Not on file  . Stress: Not on file  Relationships  . Social connections:    Talks on phone: Not on file    Gets together: Not on file    Attends religious service: Not on file    Active member of club or organization: Not on file    Attends meetings of clubs or organizations: Not on file    Relationship status: Not on file  Other Topics Concern  . Not on file  Social History Narrative  . Not on file   Additional Social History:           See H&P              Sleep: Good  Appetite:  Good  Current Medications: Current Facility-Administered Medications  Medication Dose Route Frequency Provider Last Rate Last Dose  . acetaminophen (TYLENOL) tablet 650 mg  650 mg Oral Q6H PRN Pucilowska, Jolanta B, MD   650 mg at 01/08/19 0823  . alum & mag hydroxide-simeth (MAALOX/MYLANTA) 200-200-20 MG/5ML suspension 30 mL  30 mL Oral Q4H PRN Pucilowska, Jolanta B, MD      . diphenhydrAMINE (BENADRYL) capsule 50 mg  50 mg Oral Q8H PRN Mariel CraftMaurer, Erabella Kuipers M, MD      . divalproex (DEPAKOTE ER) 24 hr tablet 1,000 mg  1,000 mg Oral QHS Mariel CraftMaurer, Thayden Lemire M, MD   1,000 mg at 01/08/19 2154  . ibuprofen (ADVIL,MOTRIN) tablet 800 mg  800 mg Oral TID PRN Mariel CraftMaurer, Kynlei Piontek M, MD      . magnesium hydroxide (MILK OF MAGNESIA) suspension 30 mL  30 mL Oral Daily PRN Pucilowska, Jolanta B, MD      . nicotine (NICODERM CQ - dosed in mg/24 hours) patch 14 mg  14 mg Transdermal Q0600 Pucilowska, Jolanta B, MD   14 mg at 01/09/19 0820  . PARoxetine (PAXIL) tablet 20 mg  20 mg Oral Daily Mariel CraftMaurer, Sang Blount M, MD   20 mg at 01/09/19 [id number]  . propranolol (INDERAL) tablet 10 mg  10 mg Oral TID Mariel CraftMaurer, Naryiah Schley M, MD   10 mg at 01/09/19 1245    Lab Results: No results found for this or any previous visit (from the past 48 hour(s)).  Blood Alcohol  level:  Lab Results  Component Value Date   ETH <10 01/07/2019    Metabolic Disorder Labs: No results found for: HGBA1C, MPG No results found for: PROLACTIN No results found for: CHOL, TRIG, HDL, CHOLHDL, VLDL, LDLCALC  Physical Findings: AIMS: Facial and Oral Movements  Muscles of Facial Expression: None, normal Lips and Perioral Area: None, normal Jaw: None, normal,Extremity Movements Upper (arms, wrists, hands, fingers): None, normal Lower (legs, knees, ankles, toes): None, normal, Trunk Movements Neck, shoulders, hips: None, normal, Overall Severity Severity of abnormal movements (highest score from questions above): None, normal Incapacitation due to abnormal movements: None, normal Patient's awareness of abnormal movements (rate only patient's report): No Awareness, Dental Status Current problems with teeth and/or dentures?: No Does patient usually wear dentures?: No  CIWA:  CIWA-Ar Total: 2 COWS:  COWS Total Score: 2  Musculoskeletal: Strength & Muscle Tone: within normal limits Gait & Station: normal Patient leans: N/A  Psychiatric Specialty Exam: Physical Exam  Nursing note and vitals reviewed. Constitutional: She is oriented to person, place, and time. She appears well-nourished. No distress.  HENT:  Head: Normocephalic and atraumatic.  Eyes: EOM are normal.  Neck: Normal range of motion.  Cardiovascular: Normal rate and regular rhythm.  Respiratory: Effort normal.  Musculoskeletal: Normal range of motion.  Neurological: She is alert and oriented to person, place, and time.  Skin: Skin is warm and dry.    Review of Systems  Constitutional: Negative.   HENT: Negative.   Respiratory: Negative.   Cardiovascular: Negative.   Gastrointestinal: Negative.   Genitourinary: Negative.   Musculoskeletal: Negative.   Neurological: Negative.   Psychiatric/Behavioral: Positive for depression (4/10, improved from 8/10) and substance abuse. Negative for hallucinations,  memory loss and suicidal ideas. The patient is nervous/anxious. The patient does not have insomnia (improved).     Blood pressure 107/71, pulse 68, temperature 98.8 F (37.1 C), temperature source Oral, resp. rate 17, height 5\' 2"  (1.575 m), weight 87.1 kg, last menstrual period 12/24/2018, SpO2 100 %.Body mass index is 35.12 kg/m.  General Appearance: Neat  Eye Contact:  Good  Speech:  Clear and Coherent and Normal Rate  Volume:  Normal  Mood:  Euthymic  Affect:  Congruent  Thought Process:  Coherent, Goal Directed and Descriptions of Associations: Intact  Orientation:  Full (Time, Place, and Person)  Thought Content:  Logical and Hallucinations: None  Suicidal Thoughts:  No  Homicidal Thoughts:  No  Memory:  Good  Judgement:  Fair  Insight:  Fair  Psychomotor Activity:  Normal  Concentration:  Concentration: Good  Recall:  Good  Fund of Knowledge:  Good  Language:  Good  Akathisia:  No  Handed:  Right  AIMS (if indicated):     Assets:  Communication Skills Desire for Improvement Financial Resources/Insurance Housing Resilience Social Support  ADL's:  Intact  Cognition:  WNL  Sleep:  Number of Hours: 6.75     Treatment Plan Summary: Daily contact with patient to assess and evaluate symptoms and progress in treatment and Medication management   Depression and anxiety: Continue Paxil 20 mg daily for depression Continue propranolol 10 mg 3 times daily for anxiety.  Patient can be transitioned to as needed if she has bradycardia or hypotension. Continue Depakote 1000 mg daily at bedtime for mood stabilization due to patient report of irritability  Tobacco use disorder Continue Nicoderm CQ 14 mg applied daily  Insomnia Benadryl 50 mg every 8 hours as needed for anxiety or sleep. Depakote ER can be beneficial to enhance sleepiness at bedtime Suspect that patient has an underlying sleep apnea history given her symptoms and family history. I have ordered an outpatient  PSG as well as sleep consult at any [facility] after discharge.  Mariel Craft, MD 01/09/2019, 1:02 PM

## 2019-01-09 NOTE — Plan of Care (Signed)
Data: Patient is appropriate and cooperative to assessment. Patient denies SI/HI/AVH. Patient has completed daily self inventory worksheet. Patient reports anxiety during assessment. Patient has  pain rating of 0/10. Patient reports good sleep quality, appetite is good during the last 24 hours. Patient rates depression "4/10" , feelings of hopelessness "2/10" and anxiety "7/10" Patients goal for today is "I will communicate with others to help with my anxiety and learn to control my anger and calm myself down."   Action:  Q x 15 minute observation checks were completed for safety. Patient was provided with education on medications. Patient was offered support and encouragement. Patient was given scheduled medications. Patient  was encourage to attend groups, participate in unit activities and continue with plan of care.     Response: Patient is adherent with scheduled medications.  Patient has no complaints at this time. Patient is receptive to treatment and safety maintained on unit.    Problem: Education: Goal: Knowledge of [facility] Education information/materials will improve Outcome: Progressing Goal: Mental status will improve Outcome: Progressing   Problem: Health Behavior/Discharge Planning: Goal: Compliance with treatment plan for underlying cause of condition will improve Outcome: Progressing   Problem: Safety: Goal: Periods of time without injury will increase Outcome: Progressing   Problem: Education: Goal: Emotional status will improve Outcome: Not Progressing

## 2019-01-09 NOTE — Plan of Care (Signed)
Patient is newly admitted, was isolative to her room and still reports having depression.   Problem: Education: Goal: Mental status will improve Outcome: Not Progressing   Problem: Activity: Goal: Interest or engagement in activities will improve Outcome: Not Progressing

## 2019-01-09 NOTE — Progress Notes (Signed)
D - Patient was in her room upon arrival to the unit. Patient was pleasant during assessment and medication administration. Patient denies SI/HI/AVH and pain. Patient rated her depression 7/10 and anxiety 5/10. Patient was isolative to her room this evening. Patient stated to this writer, "I am tired and just want to rest. I had a good day."   A - Patient was compliant during medication administration and procedures on the unit. Patient give education. Patient given support and encouragement to be active in her treatment plan. Patient informed to let staff know if there are any issues or problems on the unit.   R - Patient being monitored Q 15 minutes for safety per unit protocol. Patient remains safe at this time.

## 2019-01-09 NOTE — [facility] Counselor (Signed)
Adult Comprehensive Assessment  Patient ID: Debbie Mueller, female   DOB: 1981-05-12, 38 y.o.   MRN: [id number]  Information Source: Information source: Patient  Current Stressors:  Patient states their primary concerns and needs for treatment are:: "overdose" Patient states their goals for this hospitilization and ongoing recovery are:: "get on right medications" Educational / Learning stressors: learning problems- reading and math Employment / Job issues: hard to keep a job Family Relationships: "dad is okay, mom is not goodEngineer, petroleum / Lack of resources (include bankruptcy): disability Housing / Lack of housing: stable Physical health (include injuries & life threatening diseases): scoliosis, HBP Social relationships: "not good" Substance abuse: pt denies use Bereavement / Loss: none reporte  Living/Environment/Situation:  Living Arrangements: Children Who else lives in the home?: children How long has patient lived in current situation?: 1 year What is atmosphere in current home: Comfortable  Family History:  Marital status: Single Are you sexually active?: Yes What is your sexual orientation?: hetersosexual Has your sexual activity been affected by drugs, alcohol, medication, or emotional stress?: no Does patient have children?: Yes How many children?: 2 How is patient's relationship with their children?: "good"  Childhood History:  By whom was/is the patient raised?: Mother Additional childhood history information:   Description of patient's relationship with caregiver when they were a child: "not good" Patient's description of current relationship with people who raised him/her: "not good" How were you disciplined when you got in trouble as a child/adolescent?: physically Does patient have siblings?: Yes Number of Siblings: 3 Description of patient's current relationship with siblings: "good" Did patient suffer any verbal/emotional/physical/sexual abuse as a child?:  No Did patient suffer from severe childhood neglect?: No Has patient ever been sexually abused/assaulted/raped as an adolescent or adult?: No Was the patient ever a victim of a crime or a disaster?: No Witnessed domestic violence?: No Has patient been effected by domestic violence as an adult?: No  Education:  Highest grade of school patient has completed: 12th grade Currently a student?: No Learning disability?: No  Employment/Work Situation:   Employment situation: On disability Why is patient on disability: mental and physical health How long has patient been on disability: 6 years Patient's job has been impacted by current illness: No What is the longest time patient has a held a job?: 7 months Where was the patient employed at that time?: [employer] Did You Receive Any Psychiatric Treatment/Services While in the [employer]?: No Are There Guns or Other Weapons in Your Home?: No  Financial Resources:   Financial resources: Insurance claims handler Does patient have a Lawyer or guardian?: No  Alcohol/Substance Abuse:   What has been your use of drugs/alcohol within the last 12 months?: pt denies use If attempted suicide, did drugs/alcohol play a role in this?: No Alcohol/Substance Abuse Treatment Hx: Denies past history Has alcohol/substance abuse ever caused legal problems?: No  Social Support System:   Conservation officer, nature Support System: Good Describe Community Support System: family Type of faith/religion: no  Leisure/Recreation:   Leisure and Hobbies: playing music, going outside, cleaning  Strengths/Needs:   What is the patient's perception of their strengths?: "It mades me a better person"  Discharge Plan:   Currently receiving community mental health services: No Patient states concerns and preferences for aftercare planning are: TBD with CSW Does patient have access to transportation?: No Plan for no access to transportation at discharge: TBD with CSW Will  patient be returning to same living situation after discharge?: Yes  Summary/Recommendations:  Summary and Recommendations (to be completed by the evaluator): Patient is a 38 year old female admitted voluntarily and diagnosed with Major depressive disorder, recurrent severe without psychotic features. The patient was brought to [facility] ED after ingesting 10 Lexapro pills in a suicide attempt after an argument with her boyfriend. Patient will benefit from crisis stabilization, medication evaluation, group therapy and psychoeducation. In addition to case management for discharge planning. At discharge it is recommended that patient adhere to the established discharge plan and continue treatment.   Debbie Rockwood  Mueller. 01/09/2019

## 2019-01-10 DIAGNOSIS — F332 Major depressive disorder, recurrent severe without psychotic features: Principal | ICD-10-CM

## 2019-01-10 MED ORDER — DIVALPROEX SODIUM ER 500 MG PO TB24: 1000.0000 mg | ORAL_TABLET | Freq: Every day | ORAL | 0 refills | Status: DC

## 2019-01-10 MED ORDER — PAROXETINE HCL 20 MG PO TABS: 20.0000 mg | ORAL_TABLET | Freq: Every day | ORAL | 0 refills | Status: DC

## 2019-01-10 MED ORDER — PROPRANOLOL HCL 10 MG PO TABS: 10.0000 mg | ORAL_TABLET | Freq: Three times a day (TID) | ORAL | 0 refills | Status: DC

## 2019-01-10 NOTE — Tx Team (Addendum)
Interdisciplinary Treatment and Diagnostic Plan Update  01/10/2019 Time of Session: 2:30PM Debbie Mueller MRN: [id number]  Principal Diagnosis: Major depressive disorder, recurrent severe without psychotic features (HCC)  Secondary Diagnoses: Principal Problem:   Major depressive disorder, recurrent severe without psychotic features (HCC) Active Problems:   Tobacco use disorder   Cocaine use disorder, moderate, dependence (HCC)   Cannabis use disorder, moderate, dependence (HCC)   Current Medications:  Current Facility-Administered Medications  Medication Dose Route Frequency Provider Last Rate Last Dose  . acetaminophen (TYLENOL) tablet 650 mg  650 mg Oral Q6H PRN Pucilowska, Jolanta B, MD   650 mg at 01/08/19 0823  . alum & mag hydroxide-simeth (MAALOX/MYLANTA) 200-200-20 MG/5ML suspension 30 mL  30 mL Oral Q4H PRN Pucilowska, Jolanta B, MD      . diphenhydrAMINE (BENADRYL) capsule 50 mg  50 mg Oral Q8H PRN Mariel CraftMaurer, Sheila M, MD   50 mg at 01/09/19 2135  . divalproex (DEPAKOTE ER) 24 hr tablet 1,000 mg  1,000 mg Oral QHS Mariel CraftMaurer, Sheila M, MD   1,000 mg at 01/09/19 2135  . ibuprofen (ADVIL,MOTRIN) tablet 800 mg  800 mg Oral TID PRN Mariel CraftMaurer, Sheila M, MD      . magnesium hydroxide (MILK OF MAGNESIA) suspension 30 mL  30 mL Oral Daily PRN Pucilowska, Jolanta B, MD      . nicotine (NICODERM CQ - dosed in mg/24 hours) patch 14 mg  14 mg Transdermal Q0600 Pucilowska, Jolanta B, MD   14 mg at 01/09/19 0820  . PARoxetine (PAXIL) tablet 20 mg  20 mg Oral Daily Mariel CraftMaurer, Sheila M, MD   20 mg at 01/10/19 0848  . propranolol (INDERAL) tablet 10 mg  10 mg Oral TID Mariel CraftMaurer, Sheila M, MD   10 mg at 01/10/19 1259   PTA Medications: Medications Prior to Admission  Medication Sig Dispense Refill Last Dose  . escitalopram (LEXAPRO) 10 MG tablet Take 1 tablet (10 mg total) by mouth daily. 30 tablet 6     Patient Stressors: Financial difficulties Marital or family conflict Medication change or  noncompliance  Patient Strengths: Capable of independent living General fund of knowledge Motivation for treatment/growth  Treatment Modalities: Medication Management, Group therapy, Case management,  1 to 1 session with clinician, Psychoeducation, Recreational therapy.   Physician Treatment Plan for Primary Diagnosis: Major depressive disorder, recurrent severe without psychotic features (HCC) Long Term Goal(s): Improvement in symptoms so as ready for discharge Improvement in symptoms so as ready for discharge   Short Term Goals: Ability to identify changes in lifestyle to reduce recurrence of condition will improve Ability to verbalize feelings will improve Ability to disclose and discuss suicidal ideas Ability to demonstrate self-control will improve Ability to identify and develop effective coping behaviors will improve Compliance with prescribed medications will improve Ability to identify changes in lifestyle to reduce recurrence of condition will improve Ability to verbalize feelings will improve Ability to disclose and discuss suicidal ideas Ability to demonstrate self-control will improve Ability to identify and develop effective coping behaviors will improve Compliance with prescribed medications will improve  Medication Management: Evaluate patient's response, side effects, and tolerance of medication regimen.  Therapeutic Interventions: 1 to 1 sessions, Unit Group sessions and Medication administration.  Evaluation of Outcomes: Progressing  Physician Treatment Plan for Secondary Diagnosis: Principal Problem:   Major depressive disorder, recurrent severe without psychotic features (HCC) Active Problems:   Tobacco use disorder   Cocaine use disorder, moderate, dependence (HCC)   Cannabis use disorder, moderate, dependence (  HCC)  Long Term Goal(s): Improvement in symptoms so as ready for discharge Improvement in symptoms so as ready for discharge   Short Term  Goals: Ability to identify changes in lifestyle to reduce recurrence of condition will improve Ability to verbalize feelings will improve Ability to disclose and discuss suicidal ideas Ability to demonstrate self-control will improve Ability to identify and develop effective coping behaviors will improve Compliance with prescribed medications will improve Ability to identify changes in lifestyle to reduce recurrence of condition will improve Ability to verbalize feelings will improve Ability to disclose and discuss suicidal ideas Ability to demonstrate self-control will improve Ability to identify and develop effective coping behaviors will improve Compliance with prescribed medications will improve     Medication Management: Evaluate patient's response, side effects, and tolerance of medication regimen.  Therapeutic Interventions: 1 to 1 sessions, Unit Group sessions and Medication administration.  Evaluation of Outcomes: Progressing   RN Treatment Plan for Primary Diagnosis: Major depressive disorder, recurrent severe without psychotic features (HCC) Long Term Goal(s): Knowledge of disease and therapeutic regimen to maintain health will improve  Short Term Goals: Ability to demonstrate self-control, Ability to participate in decision making will improve, Ability to verbalize feelings will improve and Ability to disclose and discuss suicidal ideas  Medication Management: RN will administer medications as ordered by provider, will assess and evaluate patient's response and provide education to patient for prescribed medication. RN will report any adverse and/or side effects to prescribing provider.  Therapeutic Interventions: 1 on 1 counseling sessions, Psychoeducation, Medication administration, Evaluate responses to treatment, Monitor vital signs and CBGs as ordered, Perform/monitor CIWA, COWS, AIMS and Fall Risk screenings as ordered, Perform wound care treatments as  ordered.  Evaluation of Outcomes: Progressing   LCSW Treatment Plan for Primary Diagnosis: Major depressive disorder, recurrent severe without psychotic features (HCC) Long Term Goal(s): Safe transition to appropriate next level of care at discharge, Engage patient in therapeutic group addressing interpersonal concerns.  Short Term Goals: Engage patient in aftercare planning with referrals and resources, Increase social support, Increase ability to appropriately verbalize feelings, Increase emotional regulation, Facilitate acceptance of mental health diagnosis and concerns and Increase skills for wellness and recovery  Therapeutic Interventions: Assess for all discharge needs, 1 to 1 time with Social worker, Explore available resources and support systems, Assess for adequacy in community support network, Educate family and significant other(s) on suicide prevention, Complete Psychosocial Assessment, Interpersonal group therapy.  Evaluation of Outcomes: Progressing   Progress in Treatment: Attending groups: Yes. Participating in groups: Yes. Taking medication as prescribed: Yes. Toleration medication: Yes. Family/Significant other contact made: No, will contact:  pt declined family contact Patient understands diagnosis: Yes. Discussing patient identified problems/goals with staff: Yes. Medical problems stabilized or resolved: Yes. Denies suicidal/homicidal ideation: Yes. Issues/concerns per patient self-inventory: No. Other: none  New problem(s) identified: No, Describe:  none  New Short Term/Long Term Goal(s): medication management for mood stabilization; elimination of SI thoughts; development of comprehensive mental wellness  Patient Goals: "get treatment and counseling, medications to help"  Discharge Plan or Barriers: Patient reports that [place]  CSW has scheduled follow up appointment.  Reason for Continuation of  Hospitalization: Anxiety Depression Medication stabilization  Estimated Length of Stay: 1-5 days  Attendees: Patient: Debbie Mueller 01/10/2019 2:57 PM  Physician: Dr. Toni Amendlapacs, MD 01/10/2019 2:57 PM  Nursing: Hulan AmatoGwen Farrish, RN 01/10/2019 2:57 PM  RN Care Manager: 01/10/2019 2:57 PM  Social Worker:  Penni Homans, LCSW 01/10/2019 2:57 PM  Recreational Therapist:  01/10/2019 2:57 PM  Other:  01/10/2019 2:57 PM  Other:  01/10/2019 2:57 PM  Other: 01/10/2019 2:57 PM    Scribe for Treatment Team: Harden Mo, LCSW 01/10/2019 2:57 PM

## 2019-01-10 NOTE — [facility] Group Notes (Signed)
[facility] Group Notes:  (Nursing/MHT/Case Management/Adjunct)  Date:  01/10/2019  Time:  11:36 PM  Type of Therapy:  Group Therapy  Participation Level:  Active  Participation Quality:  Appropriate  Affect:  Appropriate  Cognitive:  Appropriate  Insight:  Appropriate  Engagement in Group:  Engaged  Modes of Intervention:  Discussion  Summary of Progress/Problems:  Burt Ek 01/10/2019, 11:36 PM

## 2019-01-10 NOTE — Plan of Care (Signed)
Pt. Participation with unit activities is good. Pt. Attends snack time, eating good. Pt. Reports improvement in mood and emotional status. Pt. Denies si/hi/avh, can contract for safety.    Problem: Education: Goal: Emotional status will improve Outcome: Progressing Goal: Mental status will improve Outcome: Progressing   Problem: Activity: Goal: Interest or engagement in activities will improve Outcome: Progressing   Problem: Safety: Goal: Periods of time without injury will increase Outcome: Progressing

## 2019-01-10 NOTE — Plan of Care (Signed)
Patient stayed in bed most of the shift.Pleasant and cooperative on approach.Denies SI,HI and AVH.Patient stated that she is feeling better today.Compliant with medications.Attended groups.Appetite good.Support and encouragement given.

## 2019-01-10 NOTE — [facility] Group Notes (Signed)
[facility] Group Notes:  (Nursing/MHT/Case Management/Adjunct)  Date:  01/10/2019  Time:  3:00 PM  Type of Therapy:  Psychoeducational Skills  Participation Level:  Did Not Attend   Debbie Mueller 01/10/2019, 3:00 PM

## 2019-01-10 NOTE — [facility] Group Notes (Signed)
LCSW Group Therapy Note   01/10/2019 1:56 PM   Type of Therapy and Topic:  Group Therapy:  Overcoming Obstacles   Participation Level:  Active   Description of Group:    In this group patients will be encouraged to explore what they see as obstacles to their own wellness and recovery. They will be guided to discuss their thoughts, feelings, and behaviors related to these obstacles. The group will process together ways to cope with barriers, with attention given to specific choices patients can make. Each patient will be challenged to identify changes they are motivated to make in order to overcome their obstacles. This group will be process-oriented, with patients participating in exploration of their own experiences as well as giving and receiving support and challenge from other group members.   Therapeutic Goals: 1. Patient will identify personal and current obstacles as they relate to admission. 2. Patient will identify barriers that currently interfere with their wellness or overcoming obstacles.  3. Patient will identify feelings, thought process and behaviors related to these barriers. 4. Patient will identify two changes they are willing to make to overcome these obstacles:      Summary of Patient Progress Pt was active and appropriate in group. Pt reported that an obstacle for her is having little patience. Pt struggled to identify how to overcome that obstacle, but was able to discuss coping strategies.     Therapeutic Modalities:   Cognitive Behavioral Therapy Solution Focused Therapy Motivational Interviewing Relapse Prevention Therapy  Iris Pert, MSW, LCSW Clinical Social Work 01/10/2019 1:56 PM

## 2019-01-10 NOTE — Progress Notes (Signed)
D: Pt during assessments denies SI/HI/AVH, able to contract for safety. Pt is pleasant and cooperative, engages appropriate. Pt. Continues to endorse some anxiety, but reports a mostly normal mood. Pt. Affect still a bit flat. Pt. Given PRN medications per request. Pt. Reports improvement overall.   A: Q x 15 minute observation checks were completed for safety. Patient was provided with education, but could use reinforcement. Patient was given/offered medications per orders. Patient  was encourage to attend groups, participate in unit activities and continue with plan of care. Pt. Chart and plans of care reviewed. Pt. Given support and encouragement.   R: Patient is complaint with medications and unit procedures. Pt. Attends snack time, eating good.              Precautionary checks every 15 minutes for safety maintained, room free of safety hazards, patient sustains no injury or falls during this shift. Will endorse care to next shift.

## 2019-01-10 NOTE — Progress Notes (Signed)
[facility]  01/10/2019 4:48 PM Debbie NajjarHeather N Mueller  MRN:  [id number] Subjective: Follow-up patient with major depression.  Patient denies depressed mood today.  Denies suicidal ideation intent or plan.  Says she is feeling better since being back on medicine.  We talked about the events leading to her hospitalization.  Patient tends to minimize the drug abuse.  She feels that this will not be a major problem when she is discharged.  No complaints of any side effects from medicine. Principal Problem: Major depressive disorder, recurrent severe without psychotic features (HCC) Diagnosis: Principal Problem:   Major depressive disorder, recurrent severe without psychotic features (HCC) Active Problems:   Tobacco use disorder   Cocaine use disorder, moderate, dependence (HCC)   Cannabis use disorder, moderate, dependence (HCC)  Total Time spent with patient: 30 minutes  Past Psychiatric History: Past history of depression and substance abuse and some lack of compliance outside the hospital  Past Medical History:  Past Medical History:  Diagnosis Date  . Acid reflux   . Anxiety   . Anxiety disorder 2015  . Depression    PP depression  . Loss of teeth due to gum disease    wears dentures  . Overactive bladder   . Panic attacks 2015  . PTSD (post-traumatic stress disorder) 2015  . Scoliosis   . Vitamin D deficiency     Past Surgical History:  Procedure Laterality Date  . CESAREAN SECTION    . TUBAL LIGATION Bilateral 04/30/2018   Procedure: POST PARTUM TUBAL LIGATION;  Surgeon: Catalina Antiguaonstant, Peggy, MD;  Location: [facility];  Service: Gynecology;  Laterality: Bilateral;   Family History:  Family History  Problem Relation Age of Onset  . Diabetes Paternal Grandfather   . Hypertension Paternal Grandfather   . Diabetes Paternal Grandmother   . Hypertension Paternal Grandmother   . Heart attack Maternal Grandmother   . Heart attack Maternal Grandfather   . Anemia Father    . Hypertension Father   . Breast cancer Mother   . Depression Brother   . Other Son        acid reflux   Family Psychiatric  History: See previous Social History:  Social History   Substance and Sexual Activity  Alcohol Use No     Social History   Substance and Sexual Activity  Drug Use No    Social History   Socioeconomic History  . Marital status: Significant Other    Spouse name: Not on file  . Number of children: Not on file  . Years of education: Not on file  . Highest education level: Not on file  Occupational History  . Not on file  Social Needs  . Financial resource strain: Not on file  . Food insecurity:    Worry: Not on file    Inability: Not on file  . Transportation needs:    Medical: Not on file    Non-medical: Not on file  Tobacco Use  . Smoking status: Current Every Day Smoker    Packs/day: 0.50    Years: 10.00    Pack years: 5.00    Types: Cigarettes  . Smokeless tobacco: Never Used  . Tobacco comment: smokes 4 cig a day  Substance and Sexual Activity  . Alcohol use: No  . Drug use: No  . Sexual activity: Yes    Birth control/protection: Surgical    Comment: tubal  Lifestyle  . Physical activity:    Days per week: Not on  file    Minutes per session: Not on file  . Stress: Not on file  Relationships  . Social connections:    Talks on phone: Not on file    Gets together: Not on file    Attends religious service: Not on file    Active member of club or organization: Not on file    Attends meetings of clubs or organizations: Not on file    Relationship status: Not on file  Other Topics Concern  . Not on file  Social History Narrative  . Not on file   Additional Social History:                         Sleep: Fair  Appetite:  Fair  Current Medications: Current Facility-Administered Medications  Medication Dose Route Frequency Provider Last Rate Last Dose  . acetaminophen (TYLENOL) tablet 650 mg  650 mg Oral Q6H PRN  Pucilowska, Jolanta B, MD   650 mg at 01/08/19 0823  . alum & mag hydroxide-simeth (MAALOX/MYLANTA) 200-200-20 MG/5ML suspension 30 mL  30 mL Oral Q4H PRN Pucilowska, Jolanta B, MD      . diphenhydrAMINE (BENADRYL) capsule 50 mg  50 mg Oral Q8H PRN Mariel Craft, MD   50 mg at 01/09/19 2135  . divalproex (DEPAKOTE ER) 24 hr tablet 1,000 mg  1,000 mg Oral QHS Mariel Craft, MD   1,000 mg at 01/09/19 2135  . ibuprofen (ADVIL,MOTRIN) tablet 800 mg  800 mg Oral TID PRN Mariel Craft, MD      . magnesium hydroxide (MILK OF MAGNESIA) suspension 30 mL  30 mL Oral Daily PRN Pucilowska, Jolanta B, MD      . nicotine (NICODERM CQ - dosed in mg/24 hours) patch 14 mg  14 mg Transdermal Q0600 Pucilowska, Jolanta B, MD   14 mg at 01/09/19 0820  . PARoxetine (PAXIL) tablet 20 mg  20 mg Oral Daily Mariel Craft, MD   20 mg at 01/10/19 0848  . propranolol (INDERAL) tablet 10 mg  10 mg Oral TID Mariel Craft, MD   10 mg at 01/10/19 1259    Lab Results: No results found for this or any previous visit (from the past 48 hour(s)).  Blood Alcohol level:  Lab Results  Component Value Date   ETH <10 01/07/2019    Metabolic Disorder Labs: No results found for: HGBA1C, MPG No results found for: PROLACTIN No results found for: CHOL, TRIG, HDL, CHOLHDL, VLDL, LDLCALC  Physical Findings: AIMS: Facial and Oral Movements Muscles of Facial Expression: None, normal Lips and Perioral Area: None, normal Jaw: None, normal,Extremity Movements Upper (arms, wrists, hands, fingers): None, normal Lower (legs, knees, ankles, toes): None, normal, Trunk Movements Neck, shoulders, hips: None, normal, Overall Severity Severity of abnormal movements (highest score from questions above): None, normal Incapacitation due to abnormal movements: None, normal Patient's awareness of abnormal movements (rate only patient's report): No Awareness, Dental Status Current problems with teeth and/or dentures?: No Does patient  usually wear dentures?: No  CIWA:  CIWA-Ar Total: 2 COWS:  COWS Total Score: 2  Musculoskeletal: Strength & Muscle Tone: within normal limits Gait & Station: normal Patient leans: N/A  Psychiatric Specialty Exam: Physical Exam  Nursing note and vitals reviewed. Constitutional: She appears well-developed and well-nourished.  HENT:  Head: Normocephalic and atraumatic.  Eyes: Pupils are equal, round, and reactive to light. Conjunctivae are normal.  Neck: Normal range of motion.  Cardiovascular: Regular rhythm  and normal heart sounds.  Respiratory: Effort normal. No respiratory distress.  GI: Soft.  Musculoskeletal: Normal range of motion.  Neurological: She is alert.  Skin: Skin is warm and dry.  Psychiatric: She has a normal mood and affect. Judgment normal. Her speech is delayed. She is slowed. Thought content is not paranoid. She expresses no homicidal and no suicidal ideation. She exhibits abnormal recent memory.    Review of Systems  Constitutional: Negative.   HENT: Negative.   Eyes: Negative.   Respiratory: Negative.   Cardiovascular: Negative.   Gastrointestinal: Negative.   Musculoskeletal: Negative.   Skin: Negative.   Neurological: Negative.   Psychiatric/Behavioral: Negative.     Blood pressure 110/66, pulse 88, temperature 99.2 F (37.3 C), temperature source Oral, resp. rate 18, height 5\' 2"  (1.575 m), weight 87.1 kg, last menstrual period 12/24/2018, SpO2 99 %.Body mass index is 35.12 kg/m.  General Appearance: Casual  Eye Contact:  Fair  Speech:  Normal Rate  Volume:  Decreased  Mood:  Euthymic  Affect:  Constricted  Thought Process:  Coherent  Orientation:  Full (Time, Place, and Person)  Thought Content:  Logical  Suicidal Thoughts:  No  Homicidal Thoughts:  No  Memory:  Immediate;   Fair Recent;   Fair Remote;   Fair  Judgement:  Fair  Insight:  Fair  Psychomotor Activity:  Decreased  Concentration:  Concentration: Fair  Recall:  [employer]Fair  Fund  of Knowledge:  Fair  Language:  Fair  Akathisia:  No  Handed:  Right  AIMS (if indicated):     Assets:  Desire for Improvement Housing Physical Health  ADL's:  Intact  Cognition:  Impaired,  Mild  Sleep:  Number of Hours: 7     Treatment Plan Summary: Daily contact with patient to assess and evaluate symptoms and progress in treatment, Medication management and Plan No change to current medication.  Supportive counseling therapy and review of medicine plan.  We are going to go ahead and start working towards discharge possibly as early as tomorrow [facility]  Mordecai RasmussenJohn Lyndsy Gilberto, MD 01/10/2019, 4:48 PM

## 2019-01-11 MED ORDER — PAROXETINE HCL 20 MG PO TABS: 20.0000 mg | ORAL_TABLET | Freq: Every day | ORAL | 1 refills | Status: DC

## 2019-01-11 MED ORDER — DIVALPROEX SODIUM ER 500 MG PO TB24: 1000.0000 mg | ORAL_TABLET | Freq: Every day | ORAL | 1 refills | Status: DC

## 2019-01-11 MED ORDER — PROPRANOLOL HCL 10 MG PO TABS: 10.0000 mg | ORAL_TABLET | Freq: Three times a day (TID) | ORAL | 1 refills | Status: DC

## 2019-01-11 NOTE — Progress Notes (Signed)
  [facility] Adult Case Management Discharge Plan :  Will you be returning to the same living situation after discharge:  Yes,  lives with her children At discharge, do you have transportation home?: No. CSW will assist with transportation Do you have the ability to pay for your medications: Yes,  insurance  Release of information consent forms completed and in the chart;  Patient's signature needed at discharge.  Patient to Follow up at: Follow-up Information    [facility] Follow up on 01/18/2019.   Why:  You have an appointment scheduled for Tuesday 01/18/19 at 10:00 AM. Please bring a photo id, social security card, insurance card, and proof of income with you to your appointment. Thank you! Contact information: [address] [place] [address] [phone]           Next level of care provider has access to [facility] Link:no  Safety Planning and Suicide Prevention discussed: Yes,  with pt: pt declined family contact  Have you used any form of tobacco in the last 30 days? (Cigarettes, Smokeless Tobacco, Cigars, and/or Pipes): Yes  Has patient been referred to the Quitline?: Physician counseled pt on smoking cessation  Patient has been referred for addiction treatment: N/A  Suzan Slick, LCSW 01/11/2019, 9:19 AM

## 2019-01-11 NOTE — [facility] Suicide Risk Assessment (Signed)
[facility] Discharge Suicide Risk Assessment   Principal Problem: Major depressive disorder, recurrent severe without psychotic features (HCC) Discharge Diagnoses: Principal Problem:   Major depressive disorder, recurrent severe without psychotic features (HCC) Active Problems:   Tobacco use disorder   Cocaine use disorder, moderate, dependence (HCC)   Cannabis use disorder, moderate, dependence (HCC)   Total Time spent with patient: 45 minutes  Musculoskeletal: Strength & Muscle Tone: within normal limits Gait & Station: normal Patient leans: N/A  Psychiatric Specialty Exam: Review of Systems  Constitutional: Negative.   HENT: Negative.   Eyes: Negative.   Respiratory: Negative.   Cardiovascular: Negative.   Gastrointestinal: Negative.   Musculoskeletal: Negative.   Skin: Negative.   Neurological: Negative.   Psychiatric/Behavioral: Negative.     Blood pressure 106/69, pulse 71, temperature 98.8 F (37.1 C), temperature source Oral, resp. rate 18, height 5\' 2"  (1.575 m), weight 87.1 kg, last menstrual period 12/24/2018, SpO2 100 %.Body mass index is 35.12 kg/m.  General Appearance: Casual  Eye Contact::  Good  Speech:  Clear and Coherent409  Volume:  Normal  Mood:  Euthymic  Affect:  Congruent  Thought Process:  Coherent  Orientation:  Full (Time, Place, and Person)  Thought Content:  Logical  Suicidal Thoughts:  No  Homicidal Thoughts:  No  Memory:  Immediate;   Fair Recent;   Fair Remote;   Fair  Judgement:  Fair  Insight:  Fair  Psychomotor Activity:  Decreased  Concentration:  Fair  Recall:  [employer] of Knowledge:Fair  Language: Fair  Akathisia:  No  Handed:  Right  AIMS (if indicated):     Assets:  Desire for Improvement Housing Physical Health Resilience Social Support  Sleep:  Number of Hours: 7  Cognition: WNL  ADL's:  Intact   Mental Status Per Nursing Assessment::   On Admission:  NA  Demographic Factors:  Female and Cardell Peach, lesbian, or  bisexual orientation  Loss Factors: Financial problems/change in socioeconomic status  Historical Factors: Impulsivity  Risk Reduction Factors:   Responsible for children under 53 years of age, Sense of responsibility to family and Positive social support  Continued Clinical Symptoms:  Depression:   Impulsivity  Cognitive Features That Contribute To Risk:  Loss of executive function    Suicide Risk:  Minimal: No identifiable suicidal ideation.  Patients presenting with no risk factors but with morbid ruminations; may be classified as minimal risk based on the severity of the depressive symptoms  Follow-up Information    [facility] Follow up on 01/18/2019.   Why:  You have an appointment scheduled for Tuesday 01/18/19 at 10:00 AM. Please bring a photo id, social security card, insurance card, and proof of income with you to your appointment. Thank you! Contact information: [address] [place] [address] [phone]           Plan Of Care/Follow-up recommendations:  Activity:  Activity as tolerated Diet:  Regular diet Other:  Follow-up with outpatient care in Barbara Cower, MD 01/11/2019, 9:16 AM

## 2019-01-11 NOTE — Discharge Summary (Signed)
Physician Discharge Summary Note  Patient:  Debbie NajjarHeather N Andreason is an 38 y.o., female MRN:  [id number] DOB:  1981/06/19 Patient phone:  [phone] (home)  Patient address:   [address] [place] [id number],  Total Time spent with patient: 45 minutes  Date of Admission:  01/07/2019 Date of Discharge: January 11, 2019  Reason for Admission: Admitted through the emergency room with symptoms of depressed mood and recent overdose with cocaine abuse and major life stresses.  Principal Problem: Major depressive disorder, recurrent severe without psychotic features [facility](HCC) Discharge Diagnoses: Principal Problem:   Major depressive disorder, recurrent severe without psychotic features (HCC) Active Problems:   Tobacco use disorder   Cocaine use disorder, moderate, dependence (HCC)   Cannabis use disorder, moderate, dependence (HCC)   Past Psychiatric History: Past history of impulsivity and substance abuse  Past Medical History:  Past Medical History:  Diagnosis Date  . Acid reflux   . Anxiety   . Anxiety disorder 2015  . Depression    PP depression  . Loss of teeth due to gum disease    wears dentures  . Overactive bladder   . Panic attacks 2015  . PTSD (post-traumatic stress disorder) 2015  . Scoliosis   . Vitamin D deficiency     Past Surgical History:  Procedure Laterality Date  . CESAREAN SECTION    . TUBAL LIGATION Bilateral 04/30/2018   Procedure: POST PARTUM TUBAL LIGATION;  Surgeon: Catalina Antiguaonstant, Peggy, MD;  Location: [facility];  Service: Gynecology;  Laterality: Bilateral;   Family History:  Family History  Problem Relation Age of Onset  . Diabetes Paternal Grandfather   . Hypertension Paternal Grandfather   . Diabetes Paternal Grandmother   . Hypertension Paternal Grandmother   . Heart attack Maternal Grandmother   . Heart attack Maternal Grandfather   . Anemia Father   . Hypertension Father   . Breast cancer Mother   . Depression Brother   .  Other Son        acid reflux   Family Psychiatric  History: None noted Social History:  Social History   Substance and Sexual Activity  Alcohol Use No     Social History   Substance and Sexual Activity  Drug Use No    Social History   Socioeconomic History  . Marital status: Significant Other    Spouse name: Not on file  . Number of children: Not on file  . Years of education: Not on file  . Highest education level: Not on file  Occupational History  . Not on file  Social Needs  . Financial resource strain: Not on file  . Food insecurity:    Worry: Not on file    Inability: Not on file  . Transportation needs:    Medical: Not on file    Non-medical: Not on file  Tobacco Use  . Smoking status: Current Every Day Smoker    Packs/day: 0.50    Years: 10.00    Pack years: 5.00    Types: Cigarettes  . Smokeless tobacco: Never Used  . Tobacco comment: smokes 4 cig a day  Substance and Sexual Activity  . Alcohol use: No  . Drug use: No  . Sexual activity: Yes    Birth control/protection: Surgical    Comment: tubal  Lifestyle  . Physical activity:    Days per week: Not on file    Minutes per session: Not on file  . Stress: Not on file  Relationships  . Social connections:    Talks on phone: Not on file    Gets together: Not on file    Attends religious service: Not on file    Active member of club or organization: Not on file    Attends meetings of clubs or organizations: Not on file    Relationship status: Not on file  Other Topics Concern  . Not on file  Social History Narrative  . Not on file    Hospital Course: Patient was engaged in groups and activities on the unit.  Medications were changed by the admitting physician.  On reassessment the patient reports she is no longer having suicidal ideation.  Affect and mood are stabilized.  No sign of acute dangerousness.  No longer meets commitment criteria does not require inpatient hospitalization.   Psychoeducation completed.  Plan is for discharge with the patient to follow-up in [place] with local mental health agency.  Prescriptions provided.  Physical Findings: AIMS: Facial and Oral Movements Muscles of Facial Expression: None, normal Lips and Perioral Area: None, normal Jaw: None, normal,Extremity Movements Upper (arms, wrists, hands, fingers): None, normal Lower (legs, knees, ankles, toes): None, normal, Trunk Movements Neck, shoulders, hips: None, normal, Overall Severity Severity of abnormal movements (highest score from questions above): None, normal Incapacitation due to abnormal movements: None, normal Patient's awareness of abnormal movements (rate only patient's report): No Awareness, Dental Status Current problems with teeth and/or dentures?: No Does patient usually wear dentures?: No  CIWA:  CIWA-Ar Total: 2 COWS:  COWS Total Score: 2  Musculoskeletal: Strength & Muscle Tone: within normal limits Gait & Station: normal Patient leans: N/A  Psychiatric Specialty Exam: Physical Exam  Nursing note and vitals reviewed. Constitutional: She appears well-developed and well-nourished.  HENT:  Head: Normocephalic and atraumatic.  Eyes: Pupils are equal, round, and reactive to light. Conjunctivae are normal.  Neck: Normal range of motion.  Cardiovascular: Regular rhythm and normal heart sounds.  Respiratory: Effort normal. No respiratory distress.  GI: Soft.  Musculoskeletal: Normal range of motion.  Neurological: She is alert.  Skin: Skin is warm and dry.  Psychiatric: Her speech is normal and behavior is normal. Judgment and thought content normal. Her affect is blunt. Cognition and memory are normal.    Review of Systems  Constitutional: Negative.   HENT: Negative.   Eyes: Negative.   Respiratory: Negative.   Cardiovascular: Negative.   Gastrointestinal: Negative.   Musculoskeletal: Negative.   Skin: Negative.   Neurological: Negative.    Psychiatric/Behavioral: Negative.     Blood pressure 106/69, pulse 71, temperature 98.8 F (37.1 C), temperature source Oral, resp. rate 18, height 5\' 2"  (1.575 m), weight 87.1 kg, last menstrual period 12/24/2018, SpO2 100 %.Body mass index is 35.12 kg/m.  General Appearance: Casual  Eye Contact:  Fair  Speech:  Clear and Coherent  Volume:  Normal  Mood:  Euthymic  Affect:  Flat  Thought Process:  Coherent  Orientation:  Full (Time, Place, and Person)  Thought Content:  Logical  Suicidal Thoughts:  No  Homicidal Thoughts:  No  Memory:  Immediate;   Fair Recent;   Fair Remote;   Fair  Judgement:  Fair  Insight:  Fair  Psychomotor Activity:  Normal  Concentration:  Concentration: Fair  Recall:  [employer]  Fund of Knowledge:  Fair  Language:  Fair  Akathisia:  No  Handed:  Right  AIMS (if indicated):     Assets:  Desire for Improvement Housing Physical Health Resilience  ADL's:  Intact  Cognition:  WNL  Sleep:  Number of Hours: 7     Have you used any form of tobacco in the last 30 days? (Cigarettes, Smokeless Tobacco, Cigars, and/or Pipes): Yes  Has this patient used any form of tobacco in the last 30 days? (Cigarettes, Smokeless Tobacco, Cigars, and/or Pipes) Yes, Yes, A prescription for an FDA-approved tobacco cessation medication was offered at discharge and the patient refused  Blood Alcohol level:  Lab Results  Component Value Date   ETH <10 01/07/2019    Metabolic Disorder Labs:  No results found for: HGBA1C, MPG No results found for: PROLACTIN No results found for: CHOL, TRIG, HDL, CHOLHDL, VLDL, LDLCALC  See Psychiatric Specialty Exam and Suicide Risk Assessment completed by Attending Physician prior to discharge.  Discharge destination:  Home  Is patient on multiple antipsychotic therapies at discharge:  No   Has Patient had three or more failed trials of antipsychotic monotherapy by history:  No  Recommended Plan for Multiple Antipsychotic  Therapies: NA  Discharge Instructions    Diet - low sodium heart healthy   Complete by:  As directed    Increase activity slowly   Complete by:  As directed      Allergies as of 01/11/2019      Reactions   Penicillins Rash   Did it involve swelling of the face/tongue/throat, SOB, or low BP? Unknown Did it involve sudden or severe rash/hives, skin peeling, or any reaction on the inside of your mouth or nose? Unknown Did you need to seek medical attention at a hospital or doctor's office? Unknown When did it last happen?Unknown If all above answers are "NO", may proceed with cephalosporin use.      Medication List    STOP taking these medications   escitalopram 10 MG tablet Commonly known as:  LEXAPRO     TAKE these medications     Indication  divalproex 500 MG 24 hr tablet Commonly known as:  DEPAKOTE ER Take 2 tablets (1,000 mg total) by mouth at bedtime.  Indication:  Depressive Phase of Manic-Depression   PARoxetine 20 MG tablet Commonly known as:  PAXIL Take 1 tablet (20 mg total) by mouth daily.  Indication:  Major Depressive Disorder   propranolol 10 MG tablet Commonly known as:  INDERAL Take 1 tablet (10 mg total) by mouth 3 (three) times daily.  Indication:  Anxiety Related to Current Life Problems      Follow-up Information    [facility] Follow up on 01/18/2019.   Why:  You have an appointment scheduled for Tuesday 01/18/19 at 10:00 AM. Please bring a photo id, social security card, insurance card, and proof of income with you to your appointment. Thank you! Contact information: [address] [place] [address] [phone]           Follow-up recommendations:  Activity:  Activity as tolerated Diet:  Regular diet Other:  Follow-up with outpatient mental health care and medication management.  Comments: Psychoeducation about substance abuse and the importance of staying sober also provided.  Patient strongly encouraged to engage  in substance abuse treatment.  Signed: Mordecai Rasmussen, MD 01/11/2019, 9:20 AM

## 2019-01-11 NOTE — Plan of Care (Signed)
Pt. Participation with unit activities and groups is improved. Pt. Out visible more this evening. Pt. Denies si/hi/avh, can contract for safety. Pt. Reports an improvement in her mood overall and affect is visibly brighter.    Problem: Education: Goal: Emotional status will improve Outcome: Progressing Goal: Mental status will improve Outcome: Progressing   Problem: Activity: Goal: Interest or engagement in activities will improve Outcome: Progressing   Problem: Safety: Goal: Periods of time without injury will increase Outcome: Progressing

## 2019-01-11 NOTE — Progress Notes (Signed)
D: Pt. During assessments denies si/hi/avh, can contract for safety. Pt. Reports an improvement in her mood overall and affect is visibly brighter. Pt. Endorses a mostly normal mood, but rates very low depression and anxiety still a bit present.   A: Q x 15 minute observation checks were completed for safety. Patient was provided with education, but could use reinforcement. Patient was given/offered medications per orders. Patient  was encourage to attend groups, participate in unit activities and continue with plan of care. Pt. Chart and plans of care reviewed. Pt. Given support and encouragement.   R: Patient is complaint with medication and unit procedures. Pt. Participation with unit activities and groups is improved. Pt. Out visible more this evening. Pt. Sociable with staff and peers.             Precautionary checks every 15 minutes for safety maintained, room free of safety hazards, patient sustains no injury or falls during this shift. Will endorse care to next shift.

## 2019-01-11 NOTE — Progress Notes (Signed)
Recreation Therapy Notes  Date: 01/11/2019  Time: 9:30 am  Location: Craft Room  Behavioral response: Appropriate  Intervention Topic: Problem Solving  Discussion/Intervention:  Group content on today was focused on problem solving. The group described what problem solving is. Patients expressed how problems affect them and how they deal with problems. Individuals identified healthy ways to deal with problems. Patients explained what normally happens to them when they do not deal with problems. The group expressed reoccurring problems for them. The group participated in the intervention "Ways to Solve problems" where patients were given a chance to explore different ways to solve problems.  Clinical Observations/Feedback:  Patient came to group and defined problem solving as overcoming obstacles to get a solution. She explained that she sometimes avoids her problems. Participant was pulled from group by her social worker but later returned.  Individual was social with peers and staff while participating in the intervention.  Debbie Mueller LRT/CTRS         Kaylib Furness 01/11/2019 10:42 AM

## 2020-07-08 ENCOUNTER — Other Ambulatory Visit: Payer: Self-pay

## 2020-07-08 ENCOUNTER — Emergency Department (HOSPITAL_COMMUNITY)
Admission: EM | Admit: 2020-07-08 | Discharge: 2020-07-08 | Disposition: A | Discharge status: Home / Self Care | Payer: Medicaid Other | Attending: Emergency Medicine | Admitting: Emergency Medicine

## 2020-07-08 ENCOUNTER — Encounter (HOSPITAL_COMMUNITY): Payer: Self-pay

## 2020-07-08 ENCOUNTER — Emergency Department (HOSPITAL_COMMUNITY): Payer: Medicaid Other

## 2020-07-08 DIAGNOSIS — Y9289 Other specified places as the place of occurrence of the external cause: Secondary | ICD-10-CM | POA: Insufficient documentation

## 2020-07-08 DIAGNOSIS — Y9389 Activity, other specified: Secondary | ICD-10-CM | POA: Diagnosis not present

## 2020-07-08 DIAGNOSIS — F17210 Nicotine dependence, cigarettes, uncomplicated: Secondary | ICD-10-CM | POA: Insufficient documentation

## 2020-07-08 DIAGNOSIS — Y998 Other external cause status: Secondary | ICD-10-CM | POA: Diagnosis not present

## 2020-07-08 DIAGNOSIS — Z6835 Body mass index (BMI) 35.0-35.9, adult: Secondary | ICD-10-CM | POA: Diagnosis not present

## 2020-07-08 DIAGNOSIS — E669 Obesity, unspecified: Secondary | ICD-10-CM | POA: Insufficient documentation

## 2020-07-08 DIAGNOSIS — R0789 Other chest pain: Secondary | ICD-10-CM | POA: Insufficient documentation

## 2020-07-08 DIAGNOSIS — R079 Chest pain, unspecified: Secondary | ICD-10-CM | POA: Diagnosis not present

## 2020-07-08 LAB — POC URINE PREG, ED: Preg Test, Ur: NEGATIVE
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
## 2020-07-08 NOTE — Discharge Instructions (Addendum)
At this time there does not appear to be the presence of an emergent medical condition, however there is always the potential for conditions to change. Please read and follow the below instructions.  Please return to the Emergency Department immediately for any new or worsening symptoms. Please be sure to follow up with your Primary Care Provider within one week regarding your visit today; please call their office to schedule an appointment even if you are feeling better for a follow-up visit.   Get help right away if: You feel sick to your stomach (nauseous) or you throw up (vomit). You feel sweaty or light-headed. You have a cough with mucus from your lungs (sputum) or you cough up blood. You are short of breath. You have any new/concerning or worsening of symptoms These symptoms may be an emergency. Do not wait to see if the symptoms will go away. Get medical help right away. Call your local emergency services (911 in the [place] Do not drive yourself to the hospital.  Please read the additional information packets attached to your discharge summary.  Do not take your medicine if  develop an itchy rash, swelling in your mouth or lips, or difficulty breathing; call 911 and seek immediate emergency medical attention if this occurs.  You may review your lab tests and imaging results in their entirety on your MyChart account.  Please discuss all results of fully with your primary care provider and other specialist at your follow-up visit.  Note: Portions of this text may have been transcribed using voice recognition software. Every effort was made to ensure accuracy; however, inadvertent computerized transcription errors may still be present.

## 2021-04-12 ENCOUNTER — Telehealth: Payer: Self-pay

## 2021-04-12 NOTE — Telephone Encounter (Signed)
Will not accept as a new pt at this time

## 2021-04-15 ENCOUNTER — Ambulatory Visit: Payer: Medicaid Other | Admitting: Nurse Practitioner

## 2021-07-09 ENCOUNTER — Ambulatory Visit: Payer: Medicaid Other | Admitting: Nurse Practitioner

## 2021-10-31 DIAGNOSIS — Z419 Encounter for procedure for purposes other than remedying health state, unspecified: Secondary | ICD-10-CM | POA: Diagnosis not present

## 2021-12-01 DIAGNOSIS — Z419 Encounter for procedure for purposes other than remedying health state, unspecified: Secondary | ICD-10-CM | POA: Diagnosis not present

## 2022-01-01 DIAGNOSIS — Z419 Encounter for procedure for purposes other than remedying health state, unspecified: Secondary | ICD-10-CM | POA: Diagnosis not present

## 2022-01-29 DIAGNOSIS — Z419 Encounter for procedure for purposes other than remedying health state, unspecified: Secondary | ICD-10-CM | POA: Diagnosis not present

## 2022-03-01 DIAGNOSIS — Z419 Encounter for procedure for purposes other than remedying health state, unspecified: Secondary | ICD-10-CM | POA: Diagnosis not present

## 2022-03-24 DIAGNOSIS — F4310 Post-traumatic stress disorder, unspecified: Secondary | ICD-10-CM | POA: Diagnosis not present

## 2022-03-24 DIAGNOSIS — F329 Major depressive disorder, single episode, unspecified: Secondary | ICD-10-CM | POA: Diagnosis not present

## 2022-03-31 DIAGNOSIS — Z419 Encounter for procedure for purposes other than remedying health state, unspecified: Secondary | ICD-10-CM | POA: Diagnosis not present

## 2022-04-01 DIAGNOSIS — F329 Major depressive disorder, single episode, unspecified: Secondary | ICD-10-CM | POA: Diagnosis not present

## 2022-04-03 DIAGNOSIS — F4310 Post-traumatic stress disorder, unspecified: Secondary | ICD-10-CM | POA: Diagnosis not present

## 2022-04-08 DIAGNOSIS — F4310 Post-traumatic stress disorder, unspecified: Secondary | ICD-10-CM | POA: Diagnosis not present

## 2022-04-14 DIAGNOSIS — F329 Major depressive disorder, single episode, unspecified: Secondary | ICD-10-CM | POA: Diagnosis not present

## 2022-04-15 DIAGNOSIS — F4310 Post-traumatic stress disorder, unspecified: Secondary | ICD-10-CM | POA: Diagnosis not present

## 2022-04-17 DIAGNOSIS — F329 Major depressive disorder, single episode, unspecified: Secondary | ICD-10-CM | POA: Diagnosis not present

## 2022-04-22 DIAGNOSIS — F329 Major depressive disorder, single episode, unspecified: Secondary | ICD-10-CM | POA: Diagnosis not present

## 2022-04-29 DIAGNOSIS — F6089 Other specific personality disorders: Secondary | ICD-10-CM | POA: Diagnosis not present

## 2022-05-01 DIAGNOSIS — Z419 Encounter for procedure for purposes other than remedying health state, unspecified: Secondary | ICD-10-CM | POA: Diagnosis not present

## 2022-05-09 DIAGNOSIS — F329 Major depressive disorder, single episode, unspecified: Secondary | ICD-10-CM | POA: Diagnosis not present

## 2022-05-13 DIAGNOSIS — F329 Major depressive disorder, single episode, unspecified: Secondary | ICD-10-CM | POA: Diagnosis not present

## 2022-05-15 DIAGNOSIS — F329 Major depressive disorder, single episode, unspecified: Secondary | ICD-10-CM | POA: Diagnosis not present

## 2022-05-27 DIAGNOSIS — F329 Major depressive disorder, single episode, unspecified: Secondary | ICD-10-CM | POA: Diagnosis not present

## 2022-05-31 DIAGNOSIS — Z419 Encounter for procedure for purposes other than remedying health state, unspecified: Secondary | ICD-10-CM | POA: Diagnosis not present

## 2022-06-04 DIAGNOSIS — F329 Major depressive disorder, single episode, unspecified: Secondary | ICD-10-CM | POA: Diagnosis not present

## 2022-06-10 DIAGNOSIS — F329 Major depressive disorder, single episode, unspecified: Secondary | ICD-10-CM | POA: Diagnosis not present

## 2022-06-17 DIAGNOSIS — F6089 Other specific personality disorders: Secondary | ICD-10-CM | POA: Diagnosis not present

## 2022-06-24 DIAGNOSIS — F329 Major depressive disorder, single episode, unspecified: Secondary | ICD-10-CM | POA: Diagnosis not present

## 2022-07-01 DIAGNOSIS — Z419 Encounter for procedure for purposes other than remedying health state, unspecified: Secondary | ICD-10-CM | POA: Diagnosis not present

## 2022-07-09 ENCOUNTER — Encounter: Payer: Self-pay | Admitting: Family Medicine

## 2022-07-09 ENCOUNTER — Ambulatory Visit (INDEPENDENT_AMBULATORY_CARE_PROVIDER_SITE_OTHER): Payer: Medicaid Other | Admitting: Family Medicine

## 2022-07-09 VITALS — BP 125/84 | HR 107 | Ht 62.0 in | Wt 290.0 lb

## 2022-07-09 DIAGNOSIS — R12 Heartburn: Secondary | ICD-10-CM

## 2022-07-09 DIAGNOSIS — R7301 Impaired fasting glucose: Secondary | ICD-10-CM | POA: Diagnosis not present

## 2022-07-09 DIAGNOSIS — N611 Abscess of the breast and nipple: Secondary | ICD-10-CM

## 2022-07-09 DIAGNOSIS — R1011 Right upper quadrant pain: Secondary | ICD-10-CM | POA: Diagnosis not present

## 2022-07-09 DIAGNOSIS — K30 Functional dyspepsia: Secondary | ICD-10-CM

## 2022-07-09 DIAGNOSIS — N949 Unspecified condition associated with female genital organs and menstrual cycle: Secondary | ICD-10-CM

## 2022-07-09 DIAGNOSIS — E559 Vitamin D deficiency, unspecified: Secondary | ICD-10-CM | POA: Diagnosis not present

## 2022-07-09 DIAGNOSIS — N898 Other specified noninflammatory disorders of vagina: Secondary | ICD-10-CM

## 2022-07-09 DIAGNOSIS — Z1159 Encounter for screening for other viral diseases: Secondary | ICD-10-CM

## 2022-07-09 DIAGNOSIS — F17210 Nicotine dependence, cigarettes, uncomplicated: Secondary | ICD-10-CM | POA: Diagnosis not present

## 2022-07-09 DIAGNOSIS — N899 Noninflammatory disorder of vagina, unspecified: Secondary | ICD-10-CM | POA: Diagnosis not present

## 2022-07-09 DIAGNOSIS — Z0001 Encounter for general adult medical examination with abnormal findings: Secondary | ICD-10-CM

## 2022-07-09 DIAGNOSIS — K5900 Constipation, unspecified: Secondary | ICD-10-CM | POA: Diagnosis not present

## 2022-07-09 MED ORDER — SULFAMETHOXAZOLE-TRIMETHOPRIM 800-160 MG PO TABS: 1.0000 | ORAL_TABLET | Freq: Two times a day (BID) | ORAL | 0 refills | Status: AC

## 2022-07-09 MED ORDER — PANTOPRAZOLE SODIUM 20 MG PO TBEC: 20.0000 mg | DELAYED_RELEASE_TABLET | Freq: Every day | ORAL | 0 refills | Status: DC

## 2022-07-09 MED ORDER — METRONIDAZOLE 0.75 % EX GEL: 1.0000 | Freq: Two times a day (BID) | CUTANEOUS | 0 refills | Status: DC

## 2022-07-09 NOTE — Patient Instructions (Addendum)
I appreciate the opportunity to provide care to you today!    Follow up:  3 months  Labs: please stop by the lab during the week to get your blood drawn (CBC, CMP, TSH, Lipid profile, HgA1c, Vit D)  Screening:Hep C  Please pick up your antibiotic complete the full course of treatment  Please start taking your Protonix for GERD    Please continue to a heart-healthy diet and increase your physical activities. Try to exercise for at least three times a week.      It was a pleasure to see you and I look forward to continuing to work together on your health and well-being. Please do not hesitate to call the office if you need care or have questions about your care.   Have a wonderful day and week. With Gratitude, Gilmore Laroche MSN, FNP-BC

## 2022-07-09 NOTE — Progress Notes (Unsigned)
New

## 2022-07-09 NOTE — Progress Notes (Signed)
New Patient Office Visit  Subjective:  Patient ID: Debbie Mueller, female    DOB: [birth year]  Age: 41 y.o. MRN: [id number]  CC:  Chief Complaint  Patient presents with  . New Patient (Initial Visit)    Establishing care, c/o pain in stomach and in ribs onset of sx began 3 months ago, has noticed her stomach swelling, c/o a spot on her left breast that doesn't go away she noticed it 3-4 weeks ago, has a lump on vaginal area needs a referral. Due for pap will have done at next visit.     HPI Debbie Mueller is a 41 y.o. female with past medical history of *** presents for establishing care. Stomach pain: RUQ pain that radiates to the back. Pain is describe as sharp.  Pain starts at the ribs and radiates . C/o of heart burn and indigestion. Reports eating  spicy and acidic foods. Denies fever, chills, nausea, vomiting, and changes in bowel and bladder.  Abscess of the left breast: c/o of multiple boils on the breast. Onset of symptoms was 3-4 weeks ago. Vaginal Lump: c/o of lump on vaginal area that she notices when she wipes.   Past Medical History:  Diagnosis Date  . Acid reflux   . Anxiety   . Anxiety disorder 2015  . Depression    PP depression  . Loss of teeth due to gum disease    wears dentures  . Overactive bladder   . Panic attacks 2015  . PTSD (post-traumatic stress disorder) 2015  . Scoliosis   . Vitamin D deficiency     Past Surgical History:  Procedure Laterality Date  . CESAREAN SECTION    . TUBAL LIGATION Bilateral 04/30/2018   Procedure: POST PARTUM TUBAL LIGATION;  Surgeon: Catalina Antigua, MD;  Location: [facility];  Service: Gynecology;  Laterality: Bilateral;    Family History  Problem Relation Age of Onset  . Diabetes Paternal Grandfather   . Hypertension Paternal Grandfather   . Diabetes Paternal Grandmother   . Hypertension Paternal Grandmother   . Heart attack Maternal Grandmother   . Heart attack Maternal Grandfather   . Anemia Father    . Hypertension Father   . Breast cancer Mother   . Depression Brother   . Other Son        acid reflux    Social History   Socioeconomic History  . Marital status: Significant Other    Spouse name: Not on file  . Number of children: Not on file  . Years of education: Not on file  . Highest education level: Not on file  Occupational History  . Not on file  Tobacco Use  . Smoking status: Some Days    Packs/day: 0.01    Years: 10.00    Total pack years: 0.10    Types: Cigarettes  . Smokeless tobacco: Never  . Tobacco comments:    1x a week  Vaping Use  . Vaping Use: Never used  Substance and Sexual Activity  . Alcohol use: No  . Drug use: No  . Sexual activity: Yes    Birth control/protection: Surgical    Comment: tubal  Other Topics Concern  . Not on file  Social History Narrative  . Not on file   Social Determinants of Health   Financial Resource Strain: Not on file  Food Insecurity: Not on file  Transportation Needs: Not on file  Physical Activity: Not on file  Stress: Not on file  Social Connections: Not on file  Intimate Partner Violence: Not on file    ROS Review of Systems  Objective:   Today's Vitals: BP 125/84   Pulse (!) 107   Ht 5\' 2"  (1.575 m)   Wt 290 lb (131.5 kg)   SpO2 96%   BMI 53.04 kg/m   Physical Exam  Assessment & Plan:   Problem List Items Addressed This Visit   None   Outpatient Encounter Medications as of 07/09/2022  Medication Sig  . citalopram (CELEXA) 20 MG tablet Take by mouth.  . [DISCONTINUED] divalproex (DEPAKOTE ER) 500 MG 24 hr tablet Take 2 tablets (1,000 mg total) by mouth at bedtime. (Patient not taking: Reported on 07/09/2022)  . [DISCONTINUED] PARoxetine (PAXIL) 20 MG tablet Take 1 tablet (20 mg total) by mouth daily. (Patient not taking: Reported on 07/09/2022)  . [DISCONTINUED] propranolol (INDERAL) 10 MG tablet Take 1 tablet (10 mg total) by mouth 3 (three) times daily. (Patient not taking: Reported on  07/09/2022)   No facility-administered encounter medications on file as of 07/09/2022.    Follow-up: No follow-ups on file.   09/08/2022, FNP

## 2022-07-10 DIAGNOSIS — N898 Other specified noninflammatory disorders of vagina: Secondary | ICD-10-CM | POA: Insufficient documentation

## 2022-07-10 DIAGNOSIS — N949 Unspecified condition associated with female genital organs and menstrual cycle: Secondary | ICD-10-CM | POA: Insufficient documentation

## 2022-07-10 DIAGNOSIS — R109 Unspecified abdominal pain: Secondary | ICD-10-CM | POA: Insufficient documentation

## 2022-07-10 DIAGNOSIS — N611 Abscess of the breast and nipple: Secondary | ICD-10-CM | POA: Insufficient documentation

## 2022-07-10 MED ORDER — HYDROCORTISONE ACETATE 25 MG RE SUPP: 25.0000 mg | Freq: Two times a day (BID) | RECTAL | 0 refills | Status: AC

## 2022-07-10 NOTE — Assessment & Plan Note (Addendum)
RUQ pain Pain is described as sharp, starting at the sternum and branching to the right side of the abdomen C/o of heartburn and indigestion Reports eating spicy and acidic foods Denies fever, chills, nausea, vomiting, and changes in bowel and bladder Negative Murphy's sign Will start a trial of protonix given patient's dietary intake and reassess

## 2022-07-10 NOTE — Assessment & Plan Note (Signed)
C/o of multiple boils on the breast The onset of symptoms was 3-4 weeks ago Will treat with bactrim for 7 days and  Metrogel topically

## 2022-07-10 NOTE — Assessment & Plan Note (Signed)
Pelvic examination was performed, with no visible lesions or lumps noted Pt mentions frequent constipation and noticing the lump when she has a BM She denies rectal pain and hematochezia Findings likely of internal hemorrhoid  Will treat with anusol suppository and reassess symptoms Advised to follow up with GYN as needed

## 2022-07-15 DIAGNOSIS — Z1329 Encounter for screening for other suspected endocrine disorder: Secondary | ICD-10-CM | POA: Diagnosis not present

## 2022-07-15 DIAGNOSIS — R7301 Impaired fasting glucose: Secondary | ICD-10-CM | POA: Diagnosis not present

## 2022-07-15 DIAGNOSIS — E559 Vitamin D deficiency, unspecified: Secondary | ICD-10-CM | POA: Diagnosis not present

## 2022-07-15 DIAGNOSIS — Z131 Encounter for screening for diabetes mellitus: Secondary | ICD-10-CM | POA: Diagnosis not present

## 2022-07-15 DIAGNOSIS — E7800 Pure hypercholesterolemia, unspecified: Secondary | ICD-10-CM | POA: Diagnosis not present

## 2022-07-15 DIAGNOSIS — Z1159 Encounter for screening for other viral diseases: Secondary | ICD-10-CM | POA: Diagnosis not present

## 2022-07-15 DIAGNOSIS — Z0001 Encounter for general adult medical examination with abnormal findings: Secondary | ICD-10-CM | POA: Diagnosis not present

## 2022-07-16 ENCOUNTER — Other Ambulatory Visit: Payer: Self-pay

## 2022-07-16 ENCOUNTER — Other Ambulatory Visit: Payer: Self-pay | Admitting: Family Medicine

## 2022-07-16 DIAGNOSIS — E559 Vitamin D deficiency, unspecified: Secondary | ICD-10-CM

## 2022-07-16 LAB — CMP14+EGFR
ALT: 18 IU/L (ref 0–32)
AST: 19 IU/L (ref 0–40)
Albumin/Globulin Ratio: 1.6 (ref 1.2–2.2)
Albumin: 4.2 g/dL (ref 3.9–4.9)
Alkaline Phosphatase: 99 IU/L (ref 44–121)
BUN/Creatinine Ratio: 17 (ref 9–23)
BUN: 11 mg/dL (ref 6–20)
Bilirubin Total: 0.6 mg/dL (ref 0.0–1.2)
CO2: 22 mmol/L (ref 20–29)
Calcium: 8.8 mg/dL (ref 8.7–10.2)
Chloride: 103 mmol/L (ref 96–106)
Creatinine, Ser: 0.64 mg/dL (ref 0.57–1.00)
Globulin, Total: 2.6 g/dL (ref 1.5–4.5)
Glucose: 96 mg/dL (ref 70–99)
Potassium: 4.6 mmol/L (ref 3.5–5.2)
Sodium: 140 mmol/L (ref 134–144)
Total Protein: 6.8 g/dL (ref 6.0–8.5)
eGFR: 115 mL/min/{1.73_m2} (ref >59)

## 2022-07-16 LAB — CBC WITH DIFFERENTIAL/PLATELET
Basophils Absolute: 0.1 10*3/uL (ref 0.0–0.2)
Basos: 1 %
EOS (ABSOLUTE): 0.3 10*3/uL (ref 0.0–0.4)
Eos: 3 %
Hematocrit: 36.2 % (ref 34.0–46.6)
Hemoglobin: 11.3 g/dL (ref 11.1–15.9)
Immature Grans (Abs): 0 10*3/uL (ref 0.0–0.1)
Immature Granulocytes: 0 %
Lymphocytes Absolute: 2.2 10*3/uL (ref 0.7–3.1)
Lymphs: 24 %
MCH: 25.2 pg — ABNORMAL LOW (ref 26.6–33.0)
MCHC: 31.2 g/dL — ABNORMAL LOW (ref 31.5–35.7)
MCV: 81 fL (ref 79–97)
Monocytes Absolute: 0.6 10*3/uL (ref 0.1–0.9)
Monocytes: 7 %
Neutrophils Absolute: 5.9 10*3/uL (ref 1.4–7.0)
Neutrophils: 65 %
Platelets: 357 10*3/uL (ref 150–450)
RBC: 4.49 x10E6/uL (ref 3.77–5.28)
RDW: 15.1 % (ref 11.7–15.4)
WBC: 9.1 10*3/uL (ref 3.4–10.8)

## 2022-07-16 LAB — LIPID PANEL
Chol/HDL Ratio: 4.5 ratio — ABNORMAL HIGH (ref 0.0–4.4)
Cholesterol, Total: 170 mg/dL (ref 100–199)
HDL: 38 mg/dL — ABNORMAL LOW (ref >39)
LDL Chol Calc (NIH): 101 mg/dL — ABNORMAL HIGH (ref 0–99)
Triglycerides: 179 mg/dL — ABNORMAL HIGH (ref 0–149)
VLDL Cholesterol Cal: 31 mg/dL (ref 5–40)

## 2022-07-16 LAB — TSH+FREE T4
Free T4: 1.07 ng/dL (ref 0.82–1.77)
TSH: 3.74 u[IU]/mL (ref 0.450–4.500)

## 2022-07-16 LAB — HEPATITIS C ANTIBODY: Hep C Virus Ab: NONREACTIVE

## 2022-07-16 LAB — HEMOGLOBIN A1C
Est. average glucose Bld gHb Est-mCnc: 114 mg/dL
Hgb A1c MFr Bld: 5.6 % (ref 4.8–5.6)

## 2022-07-16 LAB — VITAMIN D 25 HYDROXY (VIT D DEFICIENCY, FRACTURES): Vit D, 25-Hydroxy: 15.9 ng/mL — ABNORMAL LOW (ref 30.0–100.0)

## 2022-07-16 MED ORDER — VITAMIN D (ERGOCALCIFEROL) 1.25 MG (50000 UNIT) PO CAPS
50000.0000 [IU] | ORAL_CAPSULE | ORAL | 1 refills | Status: DC
  Filled 2022-07-16: qty 5, 30d supply, fill #0

## 2022-07-16 NOTE — Progress Notes (Signed)
Please inform the patient that I have sent a prescription for Vit D to her pharmacy. Her Vit D is low. I I want her LDL to be <100, so I  recommend a low-carb and fat diet. She can also start taking OTC folic acid 2000mg  BID to lower her triglyceride levels. All other labs are stable

## 2022-07-17 ENCOUNTER — Other Ambulatory Visit (HOSPITAL_COMMUNITY): Payer: Self-pay

## 2022-08-01 ENCOUNTER — Other Ambulatory Visit: Payer: Self-pay

## 2022-08-01 DIAGNOSIS — Z419 Encounter for procedure for purposes other than remedying health state, unspecified: Secondary | ICD-10-CM | POA: Diagnosis not present

## 2022-08-07 DIAGNOSIS — F329 Major depressive disorder, single episode, unspecified: Secondary | ICD-10-CM | POA: Diagnosis not present

## 2022-08-13 ENCOUNTER — Encounter: Payer: Self-pay | Admitting: Obstetrics & Gynecology

## 2022-08-13 ENCOUNTER — Other Ambulatory Visit (HOSPITAL_COMMUNITY)
Admission: RE | Admit: 2022-08-13 | Discharge: 2022-08-13 | Disposition: A | Discharge status: Home / Self Care | Payer: Medicaid Other | Source: Ambulatory Visit | Attending: Obstetrics & Gynecology | Admitting: Obstetrics & Gynecology

## 2022-08-13 ENCOUNTER — Ambulatory Visit (INDEPENDENT_AMBULATORY_CARE_PROVIDER_SITE_OTHER): Payer: Medicaid Other | Admitting: Obstetrics & Gynecology

## 2022-08-13 VITALS — BP 118/74 | HR 95 | Ht 62.0 in | Wt 287.0 lb

## 2022-08-13 DIAGNOSIS — Z01419 Encounter for gynecological examination (general) (routine) without abnormal findings: Secondary | ICD-10-CM | POA: Insufficient documentation

## 2022-08-13 DIAGNOSIS — Z0000 Encounter for general adult medical examination without abnormal findings: Secondary | ICD-10-CM

## 2022-08-13 DIAGNOSIS — Z113 Encounter for screening for infections with a predominantly sexual mode of transmission: Secondary | ICD-10-CM

## 2022-08-13 NOTE — Progress Notes (Signed)
WELL-WOMAN EXAMINATION Patient name: Debbie Mueller MRN [id number]  Date of birth: 1981-05-01 Chief Complaint:   Gynecologic Exam  History of Present Illness:   Debbie Mueller is a 41 y.o. [phone] female being seen today for a routine well-woman exam.   Menses regular each month, but also notes spotting in between her period.  About a week after her period, she will note a few days of BRB- not heavy typically noted when she wipes.  This will happen maybe twice before the start of her next period.  This has been going on for the past several months.  Denies discharge, a little itching and odor.  She has not treated with any OTC remedies.  Sexually active with same partner- requesting STI screening.  Urinary concerns: Urinary frequency. +Stress incontinence.  +Urgency.  Up at night 3-4x per night to void.  It is unclear if this is an issue to her, but she does report feeling a bulge when she voids.   Patient's last menstrual period was 07/29/2022.  The current method of family planning is tubal ligation.    Last pap to be collected.  Last mammogram: n/a. Last colonoscopy: n/a     07/09/2022   10:31 AM 10/28/2017   10:39 AM 10/02/2017   10:46 AM  Depression screen PHQ 2/9  Decreased Interest 0 1 0  Down, Depressed, Hopeless 0 1 0  PHQ - 2 Score 0 2 0  Altered sleeping  0   Tired, decreased energy  1   Change in appetite  0   Feeling bad or failure about yourself   1   Trouble concentrating  0   Moving slowly or fidgety/restless  0   Suicidal thoughts  0   PHQ-9 Score  4   Difficult doing work/chores  Somewhat difficult       Review of Systems:   Pertinent items are noted in HPI Denies any headaches, blurred vision, fatigue, shortness of breath, chest pain, abdominal pain, bowel movements, urination, or intercourse unless otherwise stated above.  Pertinent History Reviewed:  Reviewed past medical,surgical, social and family history.  Reviewed problem list, medications  and allergies. Physical Assessment:   Vitals:   08/13/22 0901  BP: 118/74  Pulse: 95  Weight: 287 lb (130.2 kg)  Height: 5\' 2"  (1.575 m)  Body mass index is 52.49 kg/m.        Physical Examination:   General appearance - well appearing, and in no distress  Mental status - alert, oriented to person, place, and time  Psych:  She has a normal mood and affect  Skin - warm and dry, normal color, no suspicious lesions noted  Chest - effort normal, all lung fields clear to auscultation bilaterally  Heart - normal rate and regular rhythm  Neck:  midline trachea, no thyromegaly or nodules  Breasts - breasts appear normal, no suspicious masses, no skin or nipple changes or  axillary nodes  Abdomen - obese, soft, nontender, nondistended, no masses or organomegaly  Pelvic - VULVA: normal appearing vulva with no masses, tenderness or lesions  VAGINA: normal appearing vagina with normal color and discharge, no lesions  CERVIX: normal appearing cervix without discharge or lesions, no CMT.  No prolapse appreciated.  Thin prep pap is done with HR HPV cotesting  UTERUS: uterus is felt to be normal size, shape, consistency and nontender   ADNEXA: No adnexal masses or tenderness noted- exam limited due to body habitus  Extremities:  No swelling or  calf tenderness bilaterally  Chaperone: Faith Rogue     Assessment & Plan:  1) Well-Woman Exam -pap collected, reviewed ASCCP guidelines  2) Morbid obesity -discussed weight as an factor on some of her concerns -reviewed healthy choices- set goal of cutting out soda and juice, water only -GOAL 15lb weight loss by next visit  3) Intermenstrual bleeding -encouraged pt to track bleeding to establish how often/bleeding pattern -may note improvement with weight loss -if bleeding persists at her follow up- will plan for Pelvic US and possible SHG  Orders Placed This Encounter  Procedures   HIV Antibody (routine testing w rflx)   RPR    Meds: No  orders of the defined types were placed in this encounter.   Follow-up: Return in about 3 months (around 11/12/2022) for follow up.   Myna Hidalgo, DO Attending Obstetrician & Gynecologist, [facility] for [employer], [facility]

## 2022-08-14 LAB — RPR: RPR Ser Ql: NONREACTIVE

## 2022-08-14 LAB — HIV ANTIBODY (ROUTINE TESTING W REFLEX): HIV Screen 4th Generation wRfx: NONREACTIVE

## 2022-08-19 ENCOUNTER — Other Ambulatory Visit: Payer: Self-pay | Admitting: Family Medicine

## 2022-08-19 DIAGNOSIS — K30 Functional dyspepsia: Secondary | ICD-10-CM

## 2022-08-20 LAB — CYTOLOGY - PAP
Adequacy: ABSENT
Chlamydia: NEGATIVE
Comment: NEGATIVE
Comment: NEGATIVE
Comment: NORMAL
Diagnosis: NEGATIVE
High risk HPV: NEGATIVE
Neisseria Gonorrhea: NEGATIVE

## 2022-08-25 ENCOUNTER — Other Ambulatory Visit: Payer: Self-pay

## 2022-08-25 ENCOUNTER — Other Ambulatory Visit: Payer: Self-pay | Admitting: Obstetrics & Gynecology

## 2022-08-25 DIAGNOSIS — B9689 Other specified bacterial agents as the cause of diseases classified elsewhere: Secondary | ICD-10-CM

## 2022-08-25 DIAGNOSIS — F329 Major depressive disorder, single episode, unspecified: Secondary | ICD-10-CM | POA: Diagnosis not present

## 2022-08-25 MED ORDER — METRONIDAZOLE 500 MG PO TABS
500.0000 mg | ORAL_TABLET | Freq: Two times a day (BID) | ORAL | 0 refills | Status: AC
  Filled 2022-08-25: qty 14, 7d supply, fill #0

## 2022-08-25 NOTE — Progress Notes (Signed)
Rx for flagyl replacement  Janyth Pupa, DO Attending [place], [facility] for [facility], [place]

## 2022-08-31 DIAGNOSIS — Z419 Encounter for procedure for purposes other than remedying health state, unspecified: Secondary | ICD-10-CM | POA: Diagnosis not present

## 2022-09-16 ENCOUNTER — Other Ambulatory Visit: Payer: Self-pay

## 2022-09-24 ENCOUNTER — Encounter: Payer: Self-pay | Admitting: Family Medicine

## 2022-09-24 ENCOUNTER — Ambulatory Visit (INDEPENDENT_AMBULATORY_CARE_PROVIDER_SITE_OTHER): Payer: Medicaid Other | Admitting: Family Medicine

## 2022-09-24 DIAGNOSIS — F32A Depression, unspecified: Secondary | ICD-10-CM

## 2022-09-24 DIAGNOSIS — F419 Anxiety disorder, unspecified: Secondary | ICD-10-CM

## 2022-09-24 MED ORDER — CITALOPRAM HYDROBROMIDE 40 MG PO TABS: 40.0000 mg | ORAL_TABLET | Freq: Every day | ORAL | 3 refills | Status: DC

## 2022-09-24 NOTE — Progress Notes (Signed)
Virtual Visit via Telephone Note   This visit type was conducted via telephone. This format is felt to be most appropriate for this patient at this time.  The patient did not have access to video technology/had technical difficulties with video requiring transitioning to audio format only (telephone).  All issues noted in this document were discussed and addressed.  No physical exam could be performed with this format.  Evaluation Performed:  Follow-up visit  Date:  09/24/2022   ID:  Debbie Mueller, DOB October 26, 1981, MRN [id number]  Patient Location: Home Provider Location: Clinic  Participants: Patient Location of Patient: Home Location of Provider: clinic Consent was obtain for visit to be over via telehealth. I verified that I am speaking with the correct person using two identifiers.  PCP:  Alvira Monday, FNP   Chief Complaint: Anxiety and depression  History of Present Illness:    Debbie Mueller is a 41 y.o. female with complaints of increased anxiety, panic attacks and stress over the past few months.  She reports that she has had anxiety and depression with panic attacks for 5 years, and notes worsening of her symptoms in the last 2 years. she is on Celexa 20 mg daily. She reports minimal relief of her symptoms with the treatment.  She reports that she has been on Celexa 20 mg for 3 to 4 months. she would like a referral to [facility] to speak with a therapist.  She denies suicidal thoughts and ideation today.    The patient does not have symptoms concerning for COVID-19 infection (fever, chills, cough, or new shortness of breath).   Past Medical, Surgical, Social History, Allergies, and Medications have been Reviewed.  Past Medical History:  Diagnosis Date   Acid reflux    Anxiety    Anxiety disorder 2015   Depression    PP depression   Loss of teeth due to gum disease    wears dentures   Overactive bladder    Panic attacks 2015   PTSD  (post-traumatic stress disorder) 2015   Scoliosis    Vitamin D deficiency    Past Surgical History:  Procedure Laterality Date   CESAREAN SECTION     TUBAL LIGATION Bilateral 04/30/2018   Procedure: POST PARTUM TUBAL LIGATION;  Surgeon: Mora Bellman, MD;  Location: [facility];  Service: Gynecology;  Laterality: Bilateral;     Current Meds  Medication Sig   citalopram (CELEXA) 40 MG tablet Take 1 tablet (40 mg total) by mouth daily.   metroNIDAZOLE (METROGEL) 0.75 % gel Apply 1 Application topically 2 (two) times daily.   pantoprazole (PROTONIX) 20 MG tablet TAKE 1 TABLET ONCE DAILY.   Vitamin D, Ergocalciferol, (DRISDOL) 1.25 MG (50000 UNIT) CAPS capsule Take 1 capsule (50,000 Units total) by mouth every 7 (seven) days.   [DISCONTINUED] citalopram (CELEXA) 20 MG tablet Take by mouth.     Allergies:   Penicillins   ROS:   Please see the history of present illness.     All other systems reviewed and are negative.   Labs/Other Tests and Data Reviewed:    Recent Labs: 07/15/2022: ALT 18; BUN 11; Creatinine, Ser 0.64; Hemoglobin 11.3; Platelets 357; Potassium 4.6; Sodium 140; TSH 3.740   Recent Lipid Panel Lab Results  Component Value Date/Time   CHOL 170 07/15/2022 08:33 AM   TRIG 179 (H) 07/15/2022 08:33 AM   HDL 38 (L) 07/15/2022 08:33 AM   CHOLHDL 4.5 (H) 07/15/2022 08:33 AM  LDLCALC 101 (H) 07/15/2022 08:33 AM    Wt Readings from Last 3 Encounters:  08/13/22 287 lb (130.2 kg)  07/09/22 290 lb (131.5 kg)  07/08/20 192 lb (87.1 kg)     Objective:    Vital Signs:  There were no vitals taken for this visit.     ASSESSMENT & PLAN:   Anxiety and depression Referral placed to psychiatry Uptitrate Celexa to 40 mg daily and encourage patient to start taking   Time:   Today, I have spent 12 minutes reviewing the chart, including problem list, medications, and with the patient with telehealth technology discussing the above problems.   Medication  Adjustments/Labs and Tests Ordered: Current medicines are reviewed at length with the patient today.  Concerns regarding medicines are outlined above.   Tests Ordered: Orders Placed This Encounter  Procedures   Ambulatory referral to Psychiatry    Medication Changes: Meds ordered this encounter  Medications   citalopram (CELEXA) 40 MG tablet    Sig: Take 1 tablet (40 mg total) by mouth daily.    Dispense:  30 tablet    Refill:  3     Note: This dictation was prepared with Dragon dictation along with smaller phrase technology. Similar sounding words can be transcribed inadequately or may not be corrected upon review. Any transcriptional errors that result from this process are unintentional.      Disposition:  Follow up  Signed, Alvira Monday, FNP  09/24/2022 8:45 AM     [place]

## 2022-10-01 DIAGNOSIS — Z419 Encounter for procedure for purposes other than remedying health state, unspecified: Secondary | ICD-10-CM | POA: Diagnosis not present

## 2022-10-09 ENCOUNTER — Ambulatory Visit: Payer: Medicaid Other | Admitting: Family Medicine

## 2022-10-27 ENCOUNTER — Encounter: Payer: Self-pay | Admitting: Family Medicine

## 2022-10-27 ENCOUNTER — Ambulatory Visit: Payer: Medicaid Other | Admitting: Family Medicine

## 2022-10-31 DIAGNOSIS — Z419 Encounter for procedure for purposes other than remedying health state, unspecified: Secondary | ICD-10-CM | POA: Diagnosis not present

## 2022-12-01 DIAGNOSIS — Z419 Encounter for procedure for purposes other than remedying health state, unspecified: Secondary | ICD-10-CM | POA: Diagnosis not present

## 2023-01-01 DIAGNOSIS — Z419 Encounter for procedure for purposes other than remedying health state, unspecified: Secondary | ICD-10-CM | POA: Diagnosis not present

## 2023-01-12 ENCOUNTER — Telehealth: Payer: Self-pay

## 2023-01-12 NOTE — Telephone Encounter (Signed)
faxed

## 2023-01-13 DIAGNOSIS — F6089 Other specific personality disorders: Secondary | ICD-10-CM | POA: Diagnosis not present

## 2023-01-19 DIAGNOSIS — F329 Major depressive disorder, single episode, unspecified: Secondary | ICD-10-CM | POA: Diagnosis not present

## 2023-01-20 DIAGNOSIS — F6089 Other specific personality disorders: Secondary | ICD-10-CM | POA: Diagnosis not present

## 2023-01-30 DIAGNOSIS — Z419 Encounter for procedure for purposes other than remedying health state, unspecified: Secondary | ICD-10-CM | POA: Diagnosis not present

## 2023-02-03 DIAGNOSIS — F6089 Other specific personality disorders: Secondary | ICD-10-CM | POA: Diagnosis not present

## 2023-03-02 DIAGNOSIS — Z419 Encounter for procedure for purposes other than remedying health state, unspecified: Secondary | ICD-10-CM | POA: Diagnosis not present

## 2023-03-04 DIAGNOSIS — F6089 Other specific personality disorders: Secondary | ICD-10-CM | POA: Diagnosis not present

## 2023-03-20 DIAGNOSIS — F6089 Other specific personality disorders: Secondary | ICD-10-CM | POA: Diagnosis not present

## 2023-04-01 DIAGNOSIS — Z419 Encounter for procedure for purposes other than remedying health state, unspecified: Secondary | ICD-10-CM | POA: Diagnosis not present

## 2023-04-29 ENCOUNTER — Emergency Department (HOSPITAL_COMMUNITY)
Admission: EM | Admit: 2023-04-29 | Discharge: 2023-04-30 | Disposition: A | Discharge status: Home / Self Care | Payer: Medicaid Other | Source: Home / Self Care | Attending: Emergency Medicine | Admitting: Emergency Medicine

## 2023-04-29 ENCOUNTER — Other Ambulatory Visit: Payer: Self-pay

## 2023-04-29 ENCOUNTER — Encounter (HOSPITAL_COMMUNITY): Payer: Self-pay | Admitting: Emergency Medicine

## 2023-04-29 DIAGNOSIS — Z8659 Personal history of other mental and behavioral disorders: Secondary | ICD-10-CM | POA: Insufficient documentation

## 2023-04-29 DIAGNOSIS — F419 Anxiety disorder, unspecified: Secondary | ICD-10-CM | POA: Insufficient documentation

## 2023-04-29 DIAGNOSIS — R45851 Suicidal ideations: Secondary | ICD-10-CM | POA: Insufficient documentation

## 2023-04-29 DIAGNOSIS — F17210 Nicotine dependence, cigarettes, uncomplicated: Secondary | ICD-10-CM | POA: Insufficient documentation

## 2023-04-29 DIAGNOSIS — F332 Major depressive disorder, recurrent severe without psychotic features: Secondary | ICD-10-CM | POA: Insufficient documentation

## 2023-04-29 DIAGNOSIS — R9431 Abnormal electrocardiogram [ECG] [EKG]: Secondary | ICD-10-CM | POA: Diagnosis not present

## 2023-04-29 LAB — CBC
HCT: 40.2 % (ref 36.0–46.0)
Hemoglobin: 12.6 g/dL (ref 12.0–15.0)
MCH: 25.9 pg — ABNORMAL LOW (ref 26.0–34.0)
MCHC: 31.3 g/dL (ref 30.0–36.0)
MCV: 82.5 fL (ref 80.0–100.0)
Platelets: 395 K/uL (ref 150–400)
RBC: 4.87 MIL/uL (ref 3.87–5.11)
RDW: 16 % — ABNORMAL HIGH (ref 11.5–15.5)
WBC: 9.1 K/uL (ref 4.0–10.5)
nRBC: 0 % (ref 0.0–0.2)

## 2023-04-29 LAB — ETHANOL: Alcohol, Ethyl (B): <10 mg/dL

## 2023-04-29 LAB — COMPREHENSIVE METABOLIC PANEL
ALT: 20 U/L (ref 0–44)
AST: 18 U/L (ref 15–41)
Albumin: 4 g/dL (ref 3.5–5.0)
Alkaline Phosphatase: 88 U/L (ref 38–126)
Anion gap: 11 (ref 5–15)
BUN: 5 mg/dL — ABNORMAL LOW (ref 6–20)
CO2: 26 mmol/L (ref 22–32)
Calcium: 9.1 mg/dL (ref 8.9–10.3)
Chloride: 101 mmol/L (ref 98–111)
Creatinine, Ser: 0.76 mg/dL (ref 0.44–1.00)
GFR, Estimated: >60 mL/min (ref >60)
Glucose, Bld: 91 mg/dL (ref 70–99)
Potassium: 3.4 mmol/L — ABNORMAL LOW (ref 3.5–5.1)
Sodium: 138 mmol/L (ref 135–145)
Total Bilirubin: 1 mg/dL (ref 0.3–1.2)
Total Protein: 7.6 g/dL (ref 6.5–8.1)

## 2023-04-29 LAB — RAPID URINE DRUG SCREEN, HOSP PERFORMED
Amphetamines: NOT DETECTED
Barbiturates: NOT DETECTED
Benzodiazepines: NOT DETECTED
Cocaine: NOT DETECTED
Opiates: NOT DETECTED
Tetrahydrocannabinol: NOT DETECTED

## 2023-04-29 LAB — ACETAMINOPHEN LEVEL: Acetaminophen (Tylenol), Serum: <10 ug/mL — ABNORMAL LOW (ref 10–30)

## 2023-04-29 LAB — POC URINE PREG, ED: Preg Test, Ur: NEGATIVE

## 2023-04-29 LAB — SALICYLATE LEVEL: Salicylate Lvl: <7 mg/dL — ABNORMAL LOW (ref 7.0–30.0)

## 2023-04-29 MED ORDER — PANTOPRAZOLE SODIUM 20 MG PO TBEC
20.0000 mg | DELAYED_RELEASE_TABLET | Freq: Every day | ORAL | Status: DC
  Administered 2023-04-29: 20 mg via ORAL
  Filled 2023-04-29 (×3): qty 1

## 2023-04-29 MED ORDER — CITALOPRAM HYDROBROMIDE 20 MG PO TABS
40.0000 mg | ORAL_TABLET | Freq: Every day | ORAL | Status: DC
  Administered 2023-04-29 – 2023-04-30 (×2): 40 mg via ORAL
  Filled 2023-04-29 (×2): qty 2

## 2023-04-29 MED ORDER — NICOTINE 14 MG/24HR TD PT24
14.0000 mg | MEDICATED_PATCH | Freq: Every day | TRANSDERMAL | Status: DC
  Administered 2023-04-29 – 2023-04-30 (×2): 14 mg via TRANSDERMAL
  Filled 2023-04-29 (×2): qty 1

## 2023-04-29 NOTE — Consult Note (Signed)
[facility] Consult Note  Patient Name: Debbie Mueller MRN: [id number] DOB: [birth year] DATE OF Consult: 04/29/2023  PRIMARY PSYCHIATRIC DIAGNOSES  1.  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  Unspecified Anxiety 3.  History of PTSD  RECOMMENDATIONS  Recommendations: Medication recommendations: Recommend escitalopram 20mg  po qd for depression and anxiety as well as initiation of hydroxyzine 25mg  po TID for anxiety. Non-Medication/therapeutic recommendations: 1. Maintain safety precautions per hospital protocol. 2. Recommend inpatient psychiatric admission via involuntary hold. 3. Reassess daily so long as the patient remains boarded in the ED. 4. Continue to seek collateral information from family or close contacts as patient will allow. 5. Stable for transfer when medically cleared. Is inpatient psychiatric hospitalization recommended for this patient? Yes (Explain why): danger to self Follow-Up Telepsychiatry C/L services: We will continue to follow this patient with you until stabilized or discharged.  If you have any questions or concerns, please call our TeleCare Coordination service at  [phone] and ask for myself or the provider on-call. Communication: Treatment team members (and family members if applicable) who were involved in treatment/care discussions and planning, and with whom we spoke or engaged with via secure text/chat, include the following: ED MD Dr. Ruthy Dick  Thank you for involving [place] in the care of this patient. If you have any additional questions or concerns, please call [phone] and ask for me or the provider on-call.  TELEPSYCHIATRY ATTESTATION & CONSENT  As the provider for this telehealth consult, I attest that I verified the patient's identity using two separate identifiers, introduced myself to the patient, provided my credentials, disclosed my location, and performed this encounter via a HIPAA-compliant, real-time, face-to-face, two-way, interactive  audio and video platform and with the full consent and agreement of the patient (or guardian as applicable.)  Patient physical location: [facility]. Telehealth provider physical location: home office in state of [place].  Video start time: 11:42 PM Video end time: 12:06 AM   IDENTIFYING DATA  Debbie Mueller is a 42 y.o. year-old female for whom a psychiatric consultation has been ordered by the primary provider. The patient was identified using two separate identifiers.  CHIEF COMPLAINT/REASON FOR CONSULT  Suicidal ideation  HISTORY OF PRESENT ILLNESS (HPI)  Ms. Debbie Mueller is a 42 y.o. female with a psychiatric history significant for PTSD, anxiety and depression who presents to the ED with complaint of suicidal ideation in the context of losing custody of her children. She reports coming to the ED due to "I've been having depressive thoughts and thinking about killing myself." She says that she feels this way because she misses her daughter, hasn't seen her since November 2023. She has visitation with her son but her daughter was placed with her aunt and she won't let the patient talk to her daughter. She has attempted suicide in the past, most recently three years ago. She does have previous inpatient psychiatric admissions, most recently two years ago. She is not seeing an outpatient psychiatrist but she is prescribed escitalopram 20mg  po qd for depression by her PCP but she doesn't think that it is helpful. She is in psychotherapy for anger management as well as parenting skills. She has a safe place to live, by herself; no access to firearms. She reports unspecified mental illness in her family but no suicides in the family. She denies any illicit drug use or alcohol abuse. She reports medication allergy to PCN. She denies any HI/AVH currently but continues to endorse SI with plan to cut her wrists.  She declines to identify any potential sources of collateral information for safe disposition planning. She  reports variable sleep lately and good nutrition. She has no other concerns or complaints, understands she has been placed on IVC for inpatient psychiatric admission.  PAST PSYCHIATRIC HISTORY  Otherwise as per HPI above.  PAST MEDICAL HISTORY  Past Medical History:  Diagnosis Date   Acid reflux    Anxiety    Anxiety disorder 2015   Depression    PP depression   Loss of teeth due to gum disease    wears dentures   Overactive bladder    Panic attacks 2015   PTSD (post-traumatic stress disorder) 2015   Scoliosis    Vitamin D deficiency      HOME MEDICATIONS  Facility Ordered Medications  Medication   nicotine (NICODERM CQ - dosed in mg/24 hours) patch 14 mg   pantoprazole (PROTONIX) EC tablet 20 mg   citalopram (CELEXA) tablet 40 mg   PTA Medications  Medication Sig   mupirocin ointment (BACTROBAN) 2 % Apply topically 3 (three) times daily.   Vitamin D, Ergocalciferol, (DRISDOL) 1.25 MG (50000 UNIT) CAPS capsule Take 1 capsule (50,000 Units total) by mouth every 7 (seven) days. (Patient not taking: Reported on 04/29/2023)   pantoprazole (PROTONIX) 20 MG tablet TAKE 1 TABLET ONCE DAILY. (Patient not taking: Reported on 04/29/2023)   citalopram (CELEXA) 40 MG tablet Take 1 tablet (40 mg total) by mouth daily. (Patient not taking: Reported on 04/29/2023)    ALLERGIES  Allergies  Allergen Reactions   Penicillins Rash    Did it involve swelling of the face/tongue/throat, SOB, or low BP? Unknown Did it involve sudden or severe rash/hives, skin peeling, or any reaction on the inside of your mouth or nose? Unknown Did you need to seek medical attention at a hospital or doctor's office? Unknown When did it last happen?    Unknown   If all above answers are "NO", may proceed with cephalosporin use.     SOCIAL & SUBSTANCE USE HISTORY  Social History   Socioeconomic History   Marital status: Single    Spouse name: Not on file   Number of children: Not on file   Years of education:  Not on file   Highest education level: Not on file  Occupational History   Not on file  Tobacco Use   Smoking status: Some Days    Packs/day: 0.01    Years: 10.00    Additional pack years: 0.00    Total pack years: 0.10    Types: Cigarettes   Smokeless tobacco: Never   Tobacco comments:    1x a week  Vaping Use   Vaping Use: Never used  Substance and Sexual Activity   Alcohol use: No   Drug use: No   Sexual activity: Yes    Birth control/protection: Surgical    Comment: tubal  Other Topics Concern   Not on file  Social History Narrative   Not on file   Social Determinants of Health   Financial Resource Strain: Not on file  Food Insecurity: Not on file  Transportation Needs: Not on file  Physical Activity: Not on file  Stress: Not on file  Social Connections: Not on file   Social History   Tobacco Use  Smoking Status Some Days   Packs/day: 0.01   Years: 10.00   Additional pack years: 0.00   Total pack years: 0.10   Types: Cigarettes  Smokeless Tobacco Never  Tobacco Comments  1x a week   Social History   Substance and Sexual Activity  Alcohol Use No   Social History   Substance and Sexual Activity  Drug Use No    FAMILY HISTORY  Family History  Problem Relation Age of Onset   Diabetes Paternal Grandfather    Hypertension Paternal Grandfather    Diabetes Paternal Grandmother    Hypertension Paternal Grandmother    Heart attack Maternal Grandmother    Heart attack Maternal Grandfather    Anemia Father    Hypertension Father    Breast cancer Mother    Depression Brother    Other Son        acid reflux   Family Psychiatric History (if known): paternal grandmother with depression and suicide  MENTAL STATUS EXAM (MSE)  Alertness: Alert Orientation: Fully oriented Appearance: Congruent with age, well groomed, and appropriate dress Demeanor: Easy to engage, polite, cooperative, and good eye contact Attention: Adequate to engage in conversation,  although mild distractibility noted Language: Fluent and without word-finding difficulty Memory: Intact Motor: No motor abnormalities Empathy: Intact Mood: Normal Affect: Full and reactive Thought process: Linear, logical and goal oriented Thought content: Endorses Suicidal ideaiton; with no psychosis, hallucinations, or homicidal ideation Impulsivity: Mild Judgment: poor Insight: fair  VITALS  Blood pressure 118/69, pulse (!) 103, temperature 98.6 F (37 C), resp. rate 18, height 5\' 1"  (1.549 m), weight 115.3 kg, last menstrual period 04/25/2023, SpO2 96 %.  LABS  Admission on 04/29/2023  Component Date Value Ref Range Status   Sodium 04/29/2023 138  135 - 145 mmol/L Final   Potassium 04/29/2023 3.4 (L)  3.5 - 5.1 mmol/L Final   Chloride 04/29/2023 101  98 - 111 mmol/L Final   CO2 04/29/2023 26  22 - 32 mmol/L Final   Glucose, Bld 04/29/2023 91  70 - 99 mg/dL Final   Glucose reference range applies only to samples taken after fasting for at least 8 hours.   BUN 04/29/2023 5 (L)  6 - 20 mg/dL Final   Creatinine, Ser 04/29/2023 0.76  0.44 - 1.00 mg/dL Final   Calcium 09/81/1914 9.1  8.9 - 10.3 mg/dL Final   Total Protein 78/29/5621 7.6  6.5 - 8.1 g/dL Final   Albumin 30/86/5784 4.0  3.5 - 5.0 g/dL Final   AST 69/62/9528 18  15 - 41 U/L Final   ALT 04/29/2023 20  0 - 44 U/L Final   Alkaline Phosphatase 04/29/2023 88  38 - 126 U/L Final   Total Bilirubin 04/29/2023 1.0  0.3 - 1.2 mg/dL Final   GFR, Estimated 04/29/2023 >60  >60 mL/min Final   Comment: (NOTE) Calculated using the CKD-EPI Creatinine Equation (2021)    Anion gap 04/29/2023 11  5 - 15 Final   Performed at [facility], [address] [place], [place] [address]   Alcohol, Ethyl (B) 04/29/2023 <10  <10 mg/dL Final   Comment: (NOTE) Lowest detectable limit for serum alcohol is 10 mg/dL.  For medical purposes only. Performed at [facility], [address], [place], [place] [address]    Salicylate Lvl 04/29/2023  <7.0 (L)  7.0 - 30.0 mg/dL Final   Performed at [facility], [address] [place], [place] [address]   Acetaminophen (Tylenol), Serum 04/29/2023 <10 (L)  10 - 30 ug/mL Final   Comment: (NOTE) Therapeutic concentrations vary significantly. A range of 10-30 ug/mL  may be an effective concentration for many patients. However, some  are best treated at concentrations outside of this  range. Acetaminophen concentrations >150 ug/mL at 4 hours after ingestion  and >50 ug/mL at 12 hours after ingestion are often associated with  toxic reactions.  Performed at [facility], [address], [place], [place] [address]    WBC 04/29/2023 9.1  4.0 - 10.5 K/uL Final   RBC 04/29/2023 4.87  3.87 - 5.11 MIL/uL Final   Hemoglobin 04/29/2023 12.6  12.0 - 15.0 g/dL Final   HCT 19/14/7829 40.2  36.0 - 46.0 % Final   MCV 04/29/2023 82.5  80.0 - 100.0 fL Final   MCH 04/29/2023 25.9 (L)  26.0 - 34.0 pg Final   MCHC 04/29/2023 31.3  30.0 - 36.0 g/dL Final   RDW 56/21/3086 16.0 (H)  11.5 - 15.5 % Final   Platelets 04/29/2023 395  150 - 400 K/uL Final   nRBC 04/29/2023 0.0  0.0 - 0.2 % Final   Performed at [facility], [address], [place], [place] [address]   Opiates 04/29/2023 NONE DETECTED  NONE DETECTED Final   Cocaine 04/29/2023 NONE DETECTED  NONE DETECTED Final   Benzodiazepines 04/29/2023 NONE DETECTED  NONE DETECTED Final   Amphetamines 04/29/2023 NONE DETECTED  NONE DETECTED Final   Tetrahydrocannabinol 04/29/2023 NONE DETECTED  NONE DETECTED Final   Barbiturates 04/29/2023 NONE DETECTED  NONE DETECTED Final   Comment: (NOTE) DRUG SCREEN FOR MEDICAL PURPOSES ONLY.  IF CONFIRMATION IS NEEDED FOR ANY PURPOSE, NOTIFY LAB WITHIN 5 DAYS.  LOWEST DETECTABLE LIMITS FOR URINE DRUG SCREEN Drug Class                     Cutoff (ng/mL) Amphetamine and metabolites    1000 Barbiturate and metabolites    200 Benzodiazepine                 200 Opiates and metabolites        300 Cocaine and metabolites         300 THC                            50 Performed at [facility], [address] [place], [place] [address]    Preg Test, Ur 04/29/2023 Negative  Negative Final    PSYCHIATRIC REVIEW OF SYSTEMS (ROS)  Additional findings:      Musculoskeletal: No abnormal movements observed      Gait & Station: Normal      Pain Screening: Denies  RISK FORMULATION/ASSESSMENT  Is the patient experiencing any suicidal or homicidal ideations: Yes     Protective factors considered for safety management: domiciled, no access to firearms, not psychotic  Risk factors/concerns considered for safety management: Prior attempt Family history of suicide Depression Hopelessness Unmarried  Is there a Astronomer plan with the patient and treatment team to minimize risk factors and promote protective factors: Yes        Is crisis care placement or psychiatric hospitalization recommended: Yes     Based on my current evaluation and risk assessment, patient is determined at this time to be at:  High risk  *RISK ASSESSMENT Risk assessment is a dynamic process; it is possible that this patient's condition, and risk level, may change. This should be re-evaluated and managed over time as appropriate. Please re-consult psychiatric consult services if additional assistance is needed in terms of risk assessment and management. If your team decides to discharge this patient, please advise the patient how to best access emergency psychiatric services, or  to call 911, if their condition worsens or they feel unsafe in any way.   Kingsley Callander, DO Telepsychiatry Consult Services

## 2023-04-29 NOTE — ED Notes (Signed)
Lab at bedside to draw blood.

## 2023-04-29 NOTE — ED Notes (Signed)
Patient is changed into Debbie Mueller, belongings placed in [place] [address]

## 2023-04-29 NOTE — ED Notes (Signed)
Pt given ice water per request.

## 2023-04-29 NOTE — ED Notes (Addendum)
Pt has dressed out in burgundy scrubs, [place], NT took pt belongings/clothes and placed in locker room- pt being wanded by hospital security at this time.

## 2023-04-29 NOTE — Consult Note (Incomplete)
[facility] Consult Note  Patient Name: Debbie Mueller MRN: [id number] DOB: 31-Oct-1981 DATE OF Consult: 04/29/2023  PRIMARY PSYCHIATRIC DIAGNOSES  1.  *** 2.  *** 3.  ***  RECOMMENDATIONS  {Recommendations:304550007::"Medication recommendations: ***","Non-Medication/therapeutic recommendations: ***","Communication: Treatment team members (and family members if applicable) who were involved in treatment/care discussions and planning, and with whom we spoke or engaged with via secure text/chat, include the following: ***"}  Thank you for involving [place] in the care of this patient. If you have any additional questions or concerns, please call [phone] and ask for me or the provider on-call.  TELEPSYCHIATRY ATTESTATION & CONSENT  As the provider for this telehealth consult, I attest that I verified the patient's identity using two separate identifiers, introduced myself to the patient, provided my credentials, disclosed my location, and performed this encounter via a HIPAA-compliant, real-time, face-to-face, two-way, interactive audio and video platform and with the full consent and agreement of the patient (or guardian as applicable.)  Patient physical location: ***. Telehealth provider physical location: home office in state of ***.  Video start time: 11:42 PM Video end time:   IDENTIFYING DATA  Debbie Mueller is a 42 y.o. year-old female for whom a psychiatric consultation has been ordered by the primary provider. The patient was identified using two separate identifiers.  CHIEF COMPLAINT/REASON FOR CONSULT  Suicidal ideation  HISTORY OF PRESENT ILLNESS (HPI)  Ms. Debbie Mueller is a 42 y.o. female with a psychiatric history significant for PTSD, anxiety and depression who presents to the ED with complaint of suicidal ideation in the context of losing custody of her children. She reports coming to the ED due to "I've been having depressive thoughts and thinking about killing  myself." She says that she feels this way because she misses her daughter, hasn't seen her since November 2023. She has visitation with her son but her daughter was placed with her aunt and she won't let the patient talk to her daughter. She has attempted suicide in the past, most recently three years ago. She does have previous inpatient psychiatric admissions, most recently two years ago. She is not seeing an outpatient psychiatrist but she is prescribed escitalopram 20mg  po qd for depression by her PCP but she doesn't think that it is helpful. She is in psychotherapy for anger management as well as parenting skills. She has a safe place to live, by herself; no access to firearms. She reports unspecified mental illness in her family but no suicides in the family. She denies any illicit drug use or alcohol abuse. She reports medication allergy to PCN. She denies any HI/AVH currently but continues to endorse SI with plan to cut her wrists. She declines to identify any potential sources of collateral information for safe disposition planning. She reports variable sleep lately and good nutrition. She has no other concerns or complaints, understands she has been placed on IVC for inpatient psychiatric admission.  PAST PSYCHIATRIC HISTORY  Otherwise as per HPI above.  PAST MEDICAL HISTORY  Past Medical History:  Diagnosis Date  . Acid reflux   . Anxiety   . Anxiety disorder 2015  . Depression    PP depression  . Loss of teeth due to gum disease    wears dentures  . Overactive bladder   . Panic attacks 2015  . PTSD (post-traumatic stress disorder) 2015  . Scoliosis   . Vitamin D deficiency      HOME MEDICATIONS  Facility Ordered Medications  Medication  . nicotine (  NICODERM CQ - dosed in mg/24 hours) patch 14 mg  . pantoprazole (PROTONIX) EC tablet 20 mg  . citalopram (CELEXA) tablet 40 mg   PTA Medications  Medication Sig  . mupirocin ointment (BACTROBAN) 2 % Apply topically 3 (three) times  daily.  . Vitamin D, Ergocalciferol, (DRISDOL) 1.25 MG (50000 UNIT) CAPS capsule Take 1 capsule (50,000 Units total) by mouth every 7 (seven) days. (Patient not taking: Reported on 04/29/2023)  . pantoprazole (PROTONIX) 20 MG tablet TAKE 1 TABLET ONCE DAILY. (Patient not taking: Reported on 04/29/2023)  . citalopram (CELEXA) 40 MG tablet Take 1 tablet (40 mg total) by mouth daily. (Patient not taking: Reported on 04/29/2023)    ALLERGIES  Allergies  Allergen Reactions  . Penicillins Rash    Did it involve swelling of the face/tongue/throat, SOB, or low BP? Unknown Did it involve sudden or severe rash/hives, skin peeling, or any reaction on the inside of your mouth or nose? Unknown Did you need to seek medical attention at a hospital or doctor's office? Unknown When did it last happen?    Unknown   If all above answers are "NO", may proceed with cephalosporin use.     SOCIAL & SUBSTANCE USE HISTORY  Social History   Socioeconomic History  . Marital status: Single    Spouse name: Not on file  . Number of children: Not on file  . Years of education: Not on file  . Highest education level: Not on file  Occupational History  . Not on file  Tobacco Use  . Smoking status: Some Days    Packs/day: 0.01    Years: 10.00    Additional pack years: 0.00    Total pack years: 0.10    Types: Cigarettes  . Smokeless tobacco: Never  . Tobacco comments:    1x a week  Vaping Use  . Vaping Use: Never used  Substance and Sexual Activity  . Alcohol use: No  . Drug use: No  . Sexual activity: Yes    Birth control/protection: Surgical    Comment: tubal  Other Topics Concern  . Not on file  Social History Narrative  . Not on file   Social Determinants of Health   Financial Resource Strain: Not on file  Food Insecurity: Not on file  Transportation Needs: Not on file  Physical Activity: Not on file  Stress: Not on file  Social Connections: Not on file   Social History   Tobacco Use   Smoking Status Some Days  . Packs/day: 0.01  . Years: 10.00  . Additional pack years: 0.00  . Total pack years: 0.10  . Types: Cigarettes  Smokeless Tobacco Never  Tobacco Comments   1x a week   Social History   Substance and Sexual Activity  Alcohol Use No   Social History   Substance and Sexual Activity  Drug Use No    FAMILY HISTORY  Family History  Problem Relation Age of Onset  . Diabetes Paternal Grandfather   . Hypertension Paternal Grandfather   . Diabetes Paternal Grandmother   . Hypertension Paternal Grandmother   . Heart attack Maternal Grandmother   . Heart attack Maternal Grandfather   . Anemia Father   . Hypertension Father   . Breast cancer Mother   . Depression Brother   . Other Son        acid reflux   Family Psychiatric History (if known): paternal grandmother with depression and suicide  MENTAL STATUS EXAM (MSE)  Alertness:  Alert Orientation: Fully oriented Appearance: Congruent with age, well groomed, and appropriate dress Demeanor: Easy to engage, polite, cooperative, and good eye contact Attention: Adequate to engage in conversation, although mild distractibility was noted on testing Language: Fluent and without word-finding difficulty Memory: Intact autobiographical memory Motor: No motor abnormalities were observed Empathy: Intact Mood: Normal Affect: Full and reactive Thought process: Linear, logical and goal oriented Thought content: Appropriate, with no psychosis, hallucinations, or suicidal or homicidal ideation Impulsivity: Mild Judgment: Good Insight: Good  VITALS  Blood pressure 118/69, pulse (!) 103, temperature 98.6 F (37 C), resp. rate 18, height 5\' 1"  (1.549 m), weight 115.3 kg, last menstrual period 04/25/2023, SpO2 96 %.  LABS  Admission on 04/29/2023  Component Date Value Ref Range Status  . Sodium 04/29/2023 138  135 - 145 mmol/L Final  . Potassium 04/29/2023 3.4 (L)  3.5 - 5.1 mmol/L Final  . Chloride 04/29/2023  101  98 - 111 mmol/L Final  . CO2 04/29/2023 26  22 - 32 mmol/L Final  . Glucose, Bld 04/29/2023 91  70 - 99 mg/dL Final   Glucose reference range applies only to samples taken after fasting for at least 8 hours.  . BUN 04/29/2023 5 (L)  6 - 20 mg/dL Final  . Creatinine, Ser 04/29/2023 0.76  0.44 - 1.00 mg/dL Final  . Calcium 40/98/1191 9.1  8.9 - 10.3 mg/dL Final  . Total Protein 04/29/2023 7.6  6.5 - 8.1 g/dL Final  . Albumin 47/82/9562 4.0  3.5 - 5.0 g/dL Final  . AST 13/07/6577 18  15 - 41 U/L Final  . ALT 04/29/2023 20  0 - 44 U/L Final  . Alkaline Phosphatase 04/29/2023 88  38 - 126 U/L Final  . Total Bilirubin 04/29/2023 1.0  0.3 - 1.2 mg/dL Final  . GFR, Estimated 04/29/2023 >60  >60 mL/min Final   Comment: (NOTE) Calculated using the CKD-EPI Creatinine Equation (2021)   . Anion gap 04/29/2023 11  5 - 15 Final   Performed at [facility], [address] [place], [place] [address]  . Alcohol, Ethyl (B) 04/29/2023 <10  <10 mg/dL Final   Comment: (NOTE) Lowest detectable limit for serum alcohol is 10 mg/dL.  For medical purposes only. Performed at [facility], [address] [place], [place] [address]   . Salicylate Lvl 04/29/2023 <7.0 (L)  7.0 - 30.0 mg/dL Final   Performed at [facility], [address] [place], [place] [address]  . Acetaminophen (Tylenol), Serum 04/29/2023 <10 (L)  10 - 30 ug/mL Final   Comment: (NOTE) Therapeutic concentrations vary significantly. A range of 10-30 ug/mL  may be an effective concentration for many patients. However, some  are best treated at concentrations outside of this range. Acetaminophen concentrations >150 ug/mL at 4 hours after ingestion  and >50 ug/mL at 12 hours after ingestion are often associated with  toxic reactions.  Performed at [facility], [address], [place], [place] [address]   . WBC 04/29/2023 9.1  4.0 - 10.5 K/uL Final  . RBC 04/29/2023 4.87  3.87 - 5.11 MIL/uL Final  . Hemoglobin 04/29/2023 12.6  12.0 -  15.0 g/dL Final  . HCT 25/36/6440 40.2  36.0 - 46.0 % Final  . MCV 04/29/2023 82.5  80.0 - 100.0 fL Final  . MCH 04/29/2023 25.9 (L)  26.0 - 34.0 pg Final  . MCHC 04/29/2023 31.3  30.0 - 36.0 g/dL Final  . RDW 34/74/2595 16.0 (H)  11.5 - 15.5 % Final  .  Platelets 04/29/2023 395  150 - 400 K/uL Final  . nRBC 04/29/2023 0.0  0.0 - 0.2 % Final   Performed at [facility], [address], [place], [place] [address]  . Opiates 04/29/2023 NONE DETECTED  NONE DETECTED Final  . Cocaine 04/29/2023 NONE DETECTED  NONE DETECTED Final  . Benzodiazepines 04/29/2023 NONE DETECTED  NONE DETECTED Final  . Amphetamines 04/29/2023 NONE DETECTED  NONE DETECTED Final  . Tetrahydrocannabinol 04/29/2023 NONE DETECTED  NONE DETECTED Final  . Barbiturates 04/29/2023 NONE DETECTED  NONE DETECTED Final   Comment: (NOTE) DRUG SCREEN FOR MEDICAL PURPOSES ONLY.  IF CONFIRMATION IS NEEDED FOR ANY PURPOSE, NOTIFY LAB WITHIN 5 DAYS.  LOWEST DETECTABLE LIMITS FOR URINE DRUG SCREEN Drug Class                     Cutoff (ng/mL) Amphetamine and metabolites    1000 Barbiturate and metabolites    200 Benzodiazepine                 200 Opiates and metabolites        300 Cocaine and metabolites        300 THC                            50 Performed at [facility], [address] [place], [place] [address]   . Preg Test, Ur 04/29/2023 Negative  Negative Final    PSYCHIATRIC REVIEW OF SYSTEMS (ROS)  ROS: Notable for the following relevant positive findings: ROS  Additional findings:      Musculoskeletal: {Musculoskeletal neeeds/assessment:304550014}      Gait & Station: {Gait and Station:304550016}      Pain Screening: {Pain Description:304550015}      Nutrition & Dental Concerns: {Nutrition & Dental Concerns:304550017}  RISK FORMULATION/ASSESSMENT  Is the patient experiencing any suicidal or homicidal ideations: {yes/no:20286}       Explain if yes: *** Protective factors considered for safety management:  ***  Risk factors/concerns considered for safety management: *** {CHL BH Risk Factors Safety Management:304550011}  Is there a safety management plan with the patient and treatment team to minimize risk factors and promote protective factors: {yes/no:20286}           Explain: *** Is crisis care placement or psychiatric hospitalization recommended: {yes/no:20286}     Based on my current evaluation and risk assessment, patient is determined at this time to be at:  {Risk level:304550009}  *RISK ASSESSMENT Risk assessment is a dynamic process; it is possible that this patient's condition, and risk level, may change. This should be re-evaluated and managed over time as appropriate. Please re-consult psychiatric consult services if additional assistance is needed in terms of risk assessment and management. If your team decides to discharge this patient, please advise the patient how to best access emergency psychiatric services, or to call 911, if their condition worsens or they feel unsafe in any way.   Kingsley Callander, DO Telepsychiatry Consult Services

## 2023-04-29 NOTE — ED Notes (Signed)
IVC paperwork faxed to [facility] [phone]. 

## 2023-04-29 NOTE — ED Provider Notes (Addendum)
[facility] EMERGENC[facility] DEPARTMENT AT [facility] Provider Note   CSN: [id number] Arrival date & time: 04/29/23  1530     History  Chief Complaint  Patient presents with   Suicidal    Debbie Mueller is a 42 y.o. female.  Patient brought in by police.  Patient had IVC initiated by police.  Patient admits to suicidal ideation denies any attempts.  Patient was verbalizing suicidal ideation while at DSS earlier today.  States that she plans to slit her wrists but has not attempted anything has felt suicidal now for several weeks denies homicidal ideation denies ingestion of anything.  Past medical history significant for acid reflux anxiety depression panic attacks posttraumatic stress disorder.       Home Medications Prior to Admission medications   Medication Sig Start Date End Date Taking? Authorizing Provider  citalopram (CELEXA) 40 MG tablet Take 1 tablet (40 mg total) by mouth daily. 09/24/22   Gilmore Laroche, FNP  metroNIDAZOLE (METROGEL) 0.75 % gel Apply 1 Application topically 2 (two) times daily. 07/09/22   Gilmore Laroche, FNP  pantoprazole (PROTONIX) 20 MG tablet TAKE 1 TABLET ONCE DAILY. 08/19/22   Gilmore Laroche, FNP  Vitamin D, Ergocalciferol, (DRISDOL) 1.25 MG (50000 UNIT) CAPS capsule Take 1 capsule (50,000 Units total) by mouth every 7 (seven) days. 07/16/22   Gilmore Laroche, FNP      Allergies    Penicillins    Review of Systems   Review of Systems  Constitutional:  Negative for chills and fever.  HENT:  Negative for ear pain and sore throat.   Eyes:  Negative for pain and visual disturbance.  Respiratory:  Negative for cough and shortness of breath.   Cardiovascular:  Negative for chest pain and palpitations.  Gastrointestinal:  Negative for abdominal pain and vomiting.  Genitourinary:  Negative for dysuria and hematuria.  Musculoskeletal:  Negative for arthralgias and back pain.  Skin:  Negative for color change and rash.  Neurological:  Negative  for seizures and syncope.  Psychiatric/Behavioral:  Positive for suicidal ideas.   All other systems reviewed and are negative.   Physical Exam Updated Vital Signs BP 118/69 (BP Location: Right Arm)   Pulse (!) 103   Temp 98.6 F (37 C)   Resp 18   Ht 1.549 m (5\' 1" )   Wt 115.3 kg   LMP 04/25/2023 (Approximate)   SpO2 96%   BMI 48.05 kg/m  Physical Exam Vitals and nursing note reviewed.  Constitutional:      General: She is not in acute distress.    Appearance: Normal appearance. She is well-developed.  HENT:     Head: Normocephalic and atraumatic.  Eyes:     Extraocular Movements: Extraocular movements intact.     Conjunctiva/sclera: Conjunctivae normal.     Pupils: Pupils are equal, round, and reactive to light.  Cardiovascular:     Rate and Rhythm: Normal rate and regular rhythm.     Heart sounds: No murmur heard. Pulmonary:     Effort: Pulmonary effort is normal. No respiratory distress.     Breath sounds: Normal breath sounds.  Abdominal:     Palpations: Abdomen is soft.     Tenderness: There is no abdominal tenderness.  Musculoskeletal:        General: No swelling.     Cervical back: Normal range of motion and neck supple.  Skin:    General: Skin is warm and dry.     Capillary Refill: Capillary refill takes less  than 2 seconds.  Neurological:     General: No focal deficit present.     Mental Status: She is alert and oriented to person, place, and time.     Cranial Nerves: No cranial nerve deficit.     Sensory: No sensory deficit.     Motor: No weakness.  Psychiatric:        Mood and Affect: Mood normal.     ED Results / Procedures / Treatments   Labs (all labs ordered are listed, but only abnormal results are displayed) Labs Reviewed  COMPREHENSIVE METABOLIC PANEL - Abnormal; Notable for the following components:      Result Value   Potassium 3.4 (*)    BUN 5 (*)    All other components within normal limits  CBC - Abnormal; Notable for the  following components:   MCH 25.9 (*)    RDW 16.0 (*)    All other components within normal limits  POC URINE PREG, ED - Normal  RAPID URINE DRUG SCREEN, HOSP PERFORMED  ETHANOL  SALICYLATE LEVEL  ACETAMINOPHEN LEVEL    EKG None  Radiology No results found.  Procedures Procedures    Medications Ordered in ED Medications - No data to display  ED Course/ Medical Decision Making/ A&P                             Medical Decision Making Amount and/or Complexity of Data Reviewed Labs: ordered.  Risk OTC drugs.   Patient denies any ingestion or attempt but says she will slit her wrist.  Is been feeling suicidal for several weeks.  Brought in with initial IVC by police.  Patient's complete metabolic panel only significant for potassium of 3.4 liver function test normal renal function normal CBC no leukocytosis hemoglobin 12.6 platelets 395 rapid urine drug screen negative pregnancy test negative patient has ethanol aspirin and Tylenol levels pending.  If those have no significant elevation patient medically cleared for evaluation by behavioral health.  IVC will be completed.  Patient soaked metabolic panel was significant abnormalities.  That was just mentioned with a potassium of 3.4.  Patient's ethanol less than 10 salicylate less than 7 Tylenol less than 10 and patient's CBC as mentioned before without any acute abnormalities.  Patient medically cleared will have behavioral health interview.  Patient is IVC completed.   Final Clinical Impression(s) / ED Diagnoses Final diagnoses:  Suicidal ideation    Rx / DC Orders ED Discharge Orders     None         Vanetta Mulders, MD 04/29/23 Claria Dice    Vanetta Mulders, MD 04/29/23 [phone]

## 2023-04-29 NOTE — ED Triage Notes (Signed)
Pt via Sheriff with IVC paperwork due to verbalizing suicidal ideation while at DSS earlier today. Pt admits that she has a plan to slit her wrists but has not attempted to act on these thoughts. Denies HI

## 2023-04-29 NOTE — ED Notes (Signed)
Dinner tray has been ordered for this pt

## 2023-04-29 NOTE — ED Notes (Signed)
pt requesting nicotine patch, smokes 1/2 pack daily Dr Deretha Emory made aware.

## 2023-04-29 NOTE — ED Notes (Signed)
Pt ate and drank everything

## 2023-04-30 ENCOUNTER — Encounter (HOSPITAL_COMMUNITY): Payer: Self-pay | Admitting: Psychiatry

## 2023-04-30 ENCOUNTER — Other Ambulatory Visit: Payer: Self-pay

## 2023-04-30 ENCOUNTER — Inpatient Hospital Stay (HOSPITAL_COMMUNITY)
Admission: AD | Admit: 2023-04-30 | Discharge: 2023-05-06 | DRG: 885 | Disposition: A | Discharge status: Home / Self Care | Payer: Medicaid Other | Source: Intra-hospital | Attending: Psychiatry | Admitting: Psychiatry

## 2023-04-30 DIAGNOSIS — R45851 Suicidal ideations: Secondary | ICD-10-CM | POA: Diagnosis not present

## 2023-04-30 DIAGNOSIS — F515 Nightmare disorder: Secondary | ICD-10-CM | POA: Diagnosis present

## 2023-04-30 DIAGNOSIS — Z9151 Personal history of suicidal behavior: Secondary | ICD-10-CM

## 2023-04-30 DIAGNOSIS — Z5900 Homelessness unspecified: Secondary | ICD-10-CM | POA: Diagnosis not present

## 2023-04-30 DIAGNOSIS — Z818 Family history of other mental and behavioral disorders: Secondary | ICD-10-CM

## 2023-04-30 DIAGNOSIS — R9431 Abnormal electrocardiogram [ECG] [EKG]: Secondary | ICD-10-CM | POA: Diagnosis not present

## 2023-04-30 DIAGNOSIS — Z5982 Transportation insecurity: Secondary | ICD-10-CM | POA: Diagnosis not present

## 2023-04-30 DIAGNOSIS — F4310 Post-traumatic stress disorder, unspecified: Secondary | ICD-10-CM | POA: Diagnosis not present

## 2023-04-30 DIAGNOSIS — F332 Major depressive disorder, recurrent severe without psychotic features: Secondary | ICD-10-CM | POA: Diagnosis not present

## 2023-04-30 DIAGNOSIS — Z9152 Personal history of nonsuicidal self-harm: Secondary | ICD-10-CM

## 2023-04-30 DIAGNOSIS — Z419 Encounter for procedure for purposes other than remedying health state, unspecified: Secondary | ICD-10-CM | POA: Diagnosis not present

## 2023-04-30 DIAGNOSIS — N3281 Overactive bladder: Secondary | ICD-10-CM | POA: Diagnosis present

## 2023-04-30 MED ORDER — ALUM & MAG HYDROXIDE-SIMETH 200-200-20 MG/5ML PO SUSP
30.0000 mL | ORAL | Status: DC | PRN
  Administered 2023-05-04 (×2): 30 mL via ORAL
  Filled 2023-04-30 (×2): qty 30

## 2023-04-30 MED ORDER — MAGNESIUM HYDROXIDE 400 MG/5ML PO SUSP: 30.0000 mL | Freq: Every day | ORAL | Status: DC | PRN

## 2023-04-30 MED ORDER — NICOTINE 14 MG/24HR TD PT24
14.0000 mg | MEDICATED_PATCH | Freq: Every day | TRANSDERMAL | Status: DC
  Administered 2023-05-01 – 2023-05-06 (×6): 14 mg via TRANSDERMAL
  Filled 2023-04-30 (×7): qty 1

## 2023-04-30 MED ORDER — PANTOPRAZOLE SODIUM 40 MG PO TBEC
40.0000 mg | DELAYED_RELEASE_TABLET | Freq: Every day | ORAL | Status: DC
  Administered 2023-04-30: 40 mg via ORAL
  Filled 2023-04-30: qty 1

## 2023-04-30 MED ORDER — TRAZODONE HCL 50 MG PO TABS
50.0000 mg | ORAL_TABLET | Freq: Every evening | ORAL | Status: DC | PRN
  Administered 2023-04-30 – 2023-05-05 (×6): 50 mg via ORAL
  Filled 2023-04-30 (×6): qty 1

## 2023-04-30 MED ORDER — ACETAMINOPHEN 325 MG PO TABS
650.0000 mg | ORAL_TABLET | Freq: Four times a day (QID) | ORAL | Status: DC | PRN
  Administered 2023-05-01 – 2023-05-05 (×5): 650 mg via ORAL
  Filled 2023-04-30 (×5): qty 2

## 2023-04-30 MED ORDER — HYDROXYZINE HCL 25 MG PO TABS
25.0000 mg | ORAL_TABLET | Freq: Three times a day (TID) | ORAL | Status: DC | PRN
  Administered 2023-04-30 – 2023-05-06 (×9): 25 mg via ORAL
  Filled 2023-04-30 (×10): qty 1

## 2023-04-30 NOTE — ED Notes (Signed)
ED TO INPATIENT HANDOFF REPORT  ED Nurse Name and Phone #: Nori Riis, RN  S Name/Age/Gender Debbie Mueller 42 y.o. female Room/Bed: APA16A/APA16A  Code Status   Code Status: Full Code  Home/SNF/Other     Triage Complete: Triage complete  Chief Complaint V70.1  Triage Note Pt via Sheriff with IVC paperwork due to verbalizing suicidal ideation while at [facility] earlier today. Pt admits that she has a plan to slit her wrists but has not attempted to act on these thoughts. Denies HI   Allergies Allergies  Allergen Reactions   Penicillins Rash    Did it involve swelling of the face/tongue/throat, SOB, or low BP? Unknown Did it involve sudden or severe rash/hives, skin peeling, or any reaction on the inside of your mouth or nose? Unknown Did you need to seek medical attention at a hospital or doctor's office? Unknown When did it last happen?    Unknown   If all above answers are "NO", may proceed with cephalosporin use.     Level of Care/Admitting Diagnosis ED Disposition     None       B Medical/Surgery History Past Medical History:  Diagnosis Date   Acid reflux    Anxiety    Anxiety disorder 2015   Depression    PP depression   Loss of teeth due to gum disease    wears dentures   Overactive bladder    Panic attacks 2015   PTSD (post-traumatic stress disorder) 2015   Scoliosis    Vitamin D deficiency    Past Surgical History:  Procedure Laterality Date   CESAREAN SECTION     TUBAL LIGATION Bilateral 04/30/2018   Procedure: POST PARTUM TUBAL LIGATION;  Surgeon: Catalina Antigua, MD;  Location: [facility];  Service: Gynecology;  Laterality: Bilateral;     A IV Location/Drains/Wounds Patient Lines/Drains/Airways Status     Active Line/Drains/Airways     Name Placement date Placement time Site Days   Incision (Closed) 04/30/18 Abdomen 04/30/18  1226  -- 1826            Intake/Output Last 24 hours No intake or output data in the 24  hours ending 04/30/23 1305  Labs/Imaging Results for orders placed or performed during the hospital encounter of 04/29/23 (from the past 48 hour(s))  POC urine preg, ED     Status: Normal   Collection Time: 04/29/23  4:44 PM  Result Value Ref Range   Preg Test, Ur Negative Negative  Rapid urine drug screen (hospital performed)     Status: None   Collection Time: 04/29/23  4:45 PM  Result Value Ref Range   Opiates NONE DETECTED NONE DETECTED   Cocaine NONE DETECTED NONE DETECTED   Benzodiazepines NONE DETECTED NONE DETECTED   Amphetamines NONE DETECTED NONE DETECTED   Tetrahydrocannabinol NONE DETECTED NONE DETECTED   Barbiturates NONE DETECTED NONE DETECTED    Comment: (NOTE) DRUG SCREEN FOR MEDICAL PURPOSES ONLY.  IF CONFIRMATION IS NEEDED FOR ANY PURPOSE, NOTIFY LAB WITHIN 5 DAYS.  LOWEST DETECTABLE LIMITS FOR URINE DRUG SCREEN Drug Class                     Cutoff (ng/mL) Amphetamine and metabolites    1000 Barbiturate and metabolites    200 Benzodiazepine                 200 Opiates and metabolites        300 Cocaine and metabolites  300 THC                            50 Performed at [facility], [address], [place], [place] [address]   Comprehensive metabolic panel     Status: Abnormal   Collection Time: 04/29/23  4:59 PM  Result Value Ref Range   Sodium 138 135 - 145 mmol/L   Potassium 3.4 (L) 3.5 - 5.1 mmol/L   Chloride 101 98 - 111 mmol/L   CO2 26 22 - 32 mmol/L   Glucose, Bld 91 70 - 99 mg/dL    Comment: Glucose reference range applies only to samples taken after fasting for at least 8 hours.   BUN 5 (L) 6 - 20 mg/dL   Creatinine, Ser 6.57 0.44 - 1.00 mg/dL   Calcium 9.1 8.9 - 84.6 mg/dL   Total Protein 7.6 6.5 - 8.1 g/dL   Albumin 4.0 3.5 - 5.0 g/dL   AST 18 15 - 41 U/L   ALT 20 0 - 44 U/L   Alkaline Phosphatase 88 38 - 126 U/L   Total Bilirubin 1.0 0.3 - 1.2 mg/dL   GFR, Estimated >96 >29 mL/min    Comment: (NOTE) Calculated using the CKD-EPI  Creatinine Equation (2021)    Anion gap 11 5 - 15    Comment: Performed at [facility], [address] [place], [place] [address]  Ethanol     Status: None   Collection Time: 04/29/23  4:59 PM  Result Value Ref Range   Alcohol, Ethyl (B) <10 <10 mg/dL    Comment: (NOTE) Lowest detectable limit for serum alcohol is 10 mg/dL.  For medical purposes only. Performed at [facility], [address] [place], [place] [address]   Salicylate level     Status: Abnormal   Collection Time: 04/29/23  4:59 PM  Result Value Ref Range   Salicylate Lvl <7.0 (L) 7.0 - 30.0 mg/dL    Comment: Performed at [facility], [address], [place], [place] [address]  Acetaminophen level     Status: Abnormal   Collection Time: 04/29/23  4:59 PM  Result Value Ref Range   Acetaminophen (Tylenol), Serum <10 (L) 10 - 30 ug/mL    Comment: (NOTE) Therapeutic concentrations vary significantly. A range of 10-30 ug/mL  may be an effective concentration for many patients. However, some  are best treated at concentrations outside of this range. Acetaminophen concentrations >150 ug/mL at 4 hours after ingestion  and >50 ug/mL at 12 hours after ingestion are often associated with  toxic reactions.  Performed at [facility] [address], [place], [place] [address]   cbc     Status: Abnormal   Collection Time: 04/29/23  4:59 PM  Result Value Ref Range   WBC 9.1 4.0 - 10.5 K/uL   RBC 4.87 3.87 - 5.11 MIL/uL   Hemoglobin 12.6 12.0 - 15.0 g/dL   HCT 40.3 47.4 - 25.9 %   MCV 82.5 80.0 - 100.0 fL   MCH 25.9 (L) 26.0 - 34.0 pg   MCHC 31.3 30.0 - 36.0 g/dL   RDW 56.3 (H) 87.5 - 64.3 %   Platelets 395 150 - 400 K/uL   nRBC 0.0 0.0 - 0.2 %    Comment: Performed at [facility], [address], [place], [place] [address]   No results found.  Pending Labs [employer] (From admission, onward)    None  Vitals/Pain Today's Vitals   04/29/23 1622 04/29/23 1957 04/29/23 2140 04/30/23 0720  BP: 118/69    126/88  Pulse: (!) 103   82  Resp: 18   16  Temp: 98.6 F (37 C)   98.1 F (36.7 C)  TempSrc:    Oral  SpO2: 96%   100%  Weight:      Height:      PainSc:  0-No pain 0-No pain     Isolation Precautions No active isolations  Medications Medications  nicotine (NICODERM CQ - dosed in mg/24 hours) patch 14 mg (14 mg Transdermal Patch Applied 04/30/23 0926)  citalopram (CELEXA) tablet 40 mg (40 mg Oral Given 04/30/23 0927)  pantoprazole (PROTONIX) EC tablet 40 mg (40 mg Oral Given 04/30/23 9604)    Mobility walks     Focused Assessments Cardiac Assessment Handoff:    No results found for: "CKTOTAL", "CKMB", "CKMBINDEX", "TROPONINI" No results found for: "DDIMER" Does the Patient currently have chest pain? No   , Neuro Assessment Handoff:  Swallow screen pass? Yes          Neuro Assessment:   Neuro Checks:      Has TPA been given? No If patient is a Neuro Trauma and patient is going to OR before floor call report to 4N Charge nurse: [phone] or [phone]  , Renal Assessment Handoff:    R Recommendations: See Admitting Provider Note  Report given to: Christian Mate, RN  Additional Notes:

## 2023-04-30 NOTE — ED Notes (Signed)
EKG faxed to [facility] at [phone]

## 2023-04-30 NOTE — Tx Team (Signed)
Initial Treatment Plan 04/30/2023 6:16 PM KRISTE IIAMS ZOX:[id number]    PATIENT STRESSORS: Financial difficulties   Loss of custody of daughter     PATIENT STRENGTHS: Ability for insight  Physical Health    PATIENT IDENTIFIED PROBLEMS: Has not seen daughter   Unemployment  Loss of home                 DISCHARGE CRITERIA:  Ability to meet basic life and health needs Improved stabilization in mood, thinking, and/or behavior  PRELIMINARY DISCHARGE PLAN: Return to previous work or school arrangements  PATIENT/FAMILY INVOLVEMENT: This treatment plan has been presented to and reviewed with the patient, REGINE GERMER, and/or family member,The patient and family have been given the opportunity to ask questions and make suggestions.  Mathews Argyle, RN 04/30/2023, 6:16 PM

## 2023-04-30 NOTE — ED Provider Notes (Signed)
Emergency Medicine Observation Re-evaluation Note  Debbie Mueller is a 42 y.o. female, seen on rounds today.  Pt initially presented to the ED for complaints of Suicidal Currently, the patient is suicidal.  Physical Exam  BP 126/88 (BP Location: Left Arm)   Pulse 82   Temp 98.1 F (36.7 C) (Oral)   Resp 16   Ht 5\' 1"  (1.549 m)   Wt 115.3 kg   LMP 04/25/2023 (Approximate)   SpO2 100%   BMI 48.05 kg/m  Physical Exam Awake and in no acute distress  ED Course / MDM  EKG:   I have reviewed the labs performed to date as well as medications administered while in observation.  Recent changes in the last 24 hours include none.  Plan  Current plan is for psych placement for suicidal ideation.    Bethann Berkshire, MD 04/30/23 [phone]

## 2023-04-30 NOTE — [facility] Group Notes (Signed)
[facility] Group Notes:  (Nursing/MHT/Case Management/Adjunct)  Date:  04/30/2023  Time: 2000  Type of Therapy:   wrap up group  Participation Level:  Active  Participation Quality:  Appropriate, Attentive, Sharing, and Supportive  Affect:  Depressed  Cognitive:  Alert  Insight:  Lacking  Engagement in Group:  Developing/Improving  Modes of Intervention:  Clarification, Education, and Support  Summary of Progress/Problems:Positive thinking and positive change were discussed.   Marcille Buffy 04/30/2023, 10:36 PM

## 2023-04-30 NOTE — Progress Notes (Signed)
Pt was accepted to [facility] TODAY 04/30/2023, pending EKG. Bed assignment: 302-2  Pt meets inpatient criteria per Assunta Found, NP  Attending Physician will be Phineas Inches, MD  Report can be called to: - Adult unit: [phone]  Pt can arrive after pending items are received; [facility] AC to coordinate arrival time  Care Team Notified: [facility] Malva Limes, RN, Nori Riis, RN, Gretta Cool, MD, Assunta Found, NP, and Roddie Mc, RN  Cathie Beams, LCSW  04/30/2023 11:49 AM

## 2023-04-30 NOTE — ED Notes (Signed)
Pt ambulated to bathroom 

## 2023-04-30 NOTE — Progress Notes (Signed)
Patient IVC from [facility] for SI to slit her wrists. Patient stated that thoughts had become increasingly worse, as she has been unable to see her 38yr daughter, who is in the care of an aunt. Patient stated further stressors of needing to move in order to get children back into custody. She was teary during assessment endorsing feelings depression and anxiety. Denies SI at time of assessment with last thoughts the day prior. Agreed to notify staff if she had further feelings of self harm. Denies AVH at this time. Discussed admission information, patient verbalized understanding. Belongings placed in the locker and at bedside. Patient oriented to the unit, shown to her room.

## 2023-05-01 ENCOUNTER — Encounter (HOSPITAL_COMMUNITY): Payer: Self-pay

## 2023-05-01 ENCOUNTER — Encounter (HOSPITAL_COMMUNITY): Payer: Self-pay | Admitting: Psychiatry

## 2023-05-01 DIAGNOSIS — F332 Major depressive disorder, recurrent severe without psychotic features: Secondary | ICD-10-CM | POA: Diagnosis not present

## 2023-05-01 LAB — TSH: TSH: 3.547 u[IU]/mL (ref 0.350–4.500)

## 2023-05-01 MED ORDER — POTASSIUM CHLORIDE CRYS ER 20 MEQ PO TBCR
40.0000 meq | EXTENDED_RELEASE_TABLET | Freq: Once | ORAL | Status: AC
  Administered 2023-05-01: 40 meq via ORAL
  Filled 2023-05-01 (×2): qty 2

## 2023-05-01 MED ORDER — PRAZOSIN HCL 1 MG PO CAPS
1.0000 mg | ORAL_CAPSULE | Freq: Every day | ORAL | Status: DC
  Administered 2023-05-01 – 2023-05-05 (×5): 1 mg via ORAL
  Filled 2023-05-01 (×7): qty 1

## 2023-05-01 MED ORDER — VITAMIN D (ERGOCALCIFEROL) 1.25 MG (50000 UNIT) PO CAPS
50000.0000 [IU] | ORAL_CAPSULE | ORAL | Status: DC
  Administered 2023-05-01: 50000 [IU] via ORAL
  Filled 2023-05-01 (×2): qty 1

## 2023-05-01 MED ORDER — VENLAFAXINE HCL ER 75 MG PO CP24
75.0000 mg | ORAL_CAPSULE | Freq: Every day | ORAL | Status: DC
  Administered 2023-05-02 – 2023-05-03 (×2): 75 mg via ORAL
  Filled 2023-05-01 (×3): qty 1
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
## 2023-05-01 NOTE — Progress Notes (Addendum)
D:  Patient's self inventory sheet, patient sleeps good, sleep medication helpful.  Fair appetite, normal energy level, good concentration.  Rated depression, anxiety and hopeless 7.  Denied withdrawals, checked diarrhea, cramping, agitation, irritability.  Denied SI.  Physical problems, rash.  Physical pain, back ache.  Goal is get better for myself, get a better mood, be less irritable, more calm.  Plans to do things to keep mind on things that are good for me.  No discharge plans. A:  Medications administered per MD orders.  Emotional support and encouragement given patient. R:  Denied HI.  Denied A/V hallucinations.  Patient stated she was SI, contracts for safety.  Patient had denied SI earlier in the morning and felt she had to be hones. Safety maintained with 15 minute checks.

## 2023-05-01 NOTE — Progress Notes (Signed)
Patient upset and crying, has family issues, missing her two children who are with foster parents.  Feels she cannot trust or talk to anyone, no one understands her emotional pain.  Stress causes chest pain off/on, does not like to be around a lot of people, causes chest pain.  Crowds scare her.  Believes people are out to get her emotionally and physically.  Denied SI at this time.  Contracts for safety.  Does have SI today, stated she has to be honest, contracts for safety.   Neighbors crack jokes about her feelings.   Feels her life is a living nightmare.  She avoids situations.  Rather be alone.  Denied HI.  Denied A/V hallucinations.  Patient given anxiety and pain medicine this morning.

## 2023-05-01 NOTE — Progress Notes (Signed)
   05/01/23 0000  Psych Admission Type (Psych Patients Only)  Admission Status Involuntary  Psychosocial Assessment  Patient Complaints Anxiety  Eye Contact Fair  Facial Expression Anxious  Affect Sad  Speech Logical/coherent  Interaction Assertive  Motor Activity Slow  Appearance/Hygiene In scrubs  Behavior Characteristics Cooperative;Appropriate to situation  Mood Depressed  Thought Process  Coherency WDL  Content WDL  Delusions None reported or observed  Perception WDL  Hallucination None reported or observed  Judgment Poor  Confusion None  Danger to Self  Current suicidal ideation? Passive  Description of Suicide Plan no plan  Self-Injurious Behavior No self-injurious ideation or behavior indicators observed or expressed   Agreement Not to Harm Self Yes  Description of Agreement Verbal  Danger to Others  Danger to Others None reported or observed
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
## 2023-05-01 NOTE — Progress Notes (Signed)
Patient stated that she has a cyst about the size of a 50 cent piece, which is on the left breast.  Would the MD look at the area and order a cream or antibiotic.

## 2023-05-01 NOTE — BH IP Treatment Plan (Signed)
Interdisciplinary Treatment and Diagnostic Plan   05/01/2023 Time of Session: 1040 ANUJIN COSTON MRN: [id number]  Principal Diagnosis: Major depressive disorder, recurrent episode, severe (HCC)  Secondary Diagnoses: Principal Problem:   Major depressive disorder, recurrent episode, severe (HCC) Active Problems:   MDD (major depressive disorder), recurrent severe, without psychosis (HCC)   Current Medications:  Current Facility-Administered Medications  Medication Dose Route Frequency Provider Last Rate Last Admin   acetaminophen (TYLENOL) tablet 650 mg  650 mg Oral Q6H PRN Armandina Stammer I, NP   650 mg at 05/01/23 1003   alum & mag hydroxide-simeth (MAALOX/MYLANTA) 200-200-20 MG/5ML suspension 30 mL  30 mL Oral Q4H PRN Armandina Stammer I, NP       hydrOXYzine (ATARAX) tablet 25 mg  25 mg Oral TID PRN Armandina Stammer I, NP   25 mg at 05/01/23 1001   magnesium hydroxide (MILK OF MAGNESIA) suspension 30 mL  30 mL Oral Daily PRN Nwoko, Nicole Kindred I, NP       nicotine (NICODERM CQ - dosed in mg/24 hours) patch 14 mg  14 mg Transdermal Q0600 Armandina Stammer I, NP   14 mg at 05/01/23 0700   traZODone (DESYREL) tablet 50 mg  50 mg Oral QHS PRN Armandina Stammer I, NP   50 mg at 04/30/23 2119   PTA Medications: Medications Prior to Admission  Medication Sig Dispense Refill Last Dose   acetaminophen (TYLENOL) 500 MG tablet Take 1,000 mg by mouth every 6 (six) hours as needed for moderate pain or headache.      citalopram (CELEXA) 40 MG tablet Take 1 tablet (40 mg total) by mouth daily. (Patient not taking: Reported on 04/29/2023) 30 tablet 3    escitalopram (LEXAPRO) 20 MG tablet Take 20 mg by mouth daily.      mupirocin ointment (BACTROBAN) 2 % Apply topically 3 (three) times daily.      pantoprazole (PROTONIX) 20 MG tablet TAKE 1 TABLET ONCE DAILY. (Patient not taking: Reported on 04/29/2023) 30 tablet 0    Vitamin D, Ergocalciferol, (DRISDOL) 1.25 MG (50000 UNIT) CAPS capsule Take 1 capsule (50,000 Units total) by  mouth every 7 (seven) days. (Patient not taking: Reported on 04/29/2023) 5 capsule 1     Patient Stressors: Financial difficulties   Loss of custody of daughter    Patient Strengths: Ability for insight  Physical Health   Treatment Modalities: Medication Management, Group therapy, Case management,  1 to 1 session with clinician, Psychoeducation, Recreational therapy.   Physician Treatment Plan for Primary Diagnosis: Major depressive disorder, recurrent episode, severe (HCC) Long Term Goal(s):     Short Term Goals:    Medication Management: Evaluate patient's response, side effects, and tolerance of medication regimen.  Therapeutic Interventions: 1 to 1 sessions, Unit Group sessions and Medication administration.  Evaluation of Outcomes: Progressing  Physician Treatment Plan for Secondary Diagnosis: Principal Problem:   Major depressive disorder, recurrent episode, severe (HCC) Active Problems:   MDD (major depressive disorder), recurrent severe, without psychosis (HCC)  Long Term Goal(s):     Short Term Goals:       Medication Management: Evaluate patient's response, side effects, and tolerance of medication regimen.  Therapeutic Interventions: 1 to 1 sessions, Unit Group sessions and Medication administration.  Evaluation of Outcomes: Progressing   RN Treatment Plan for Primary Diagnosis: Major depressive disorder, recurrent episode, severe (HCC) Long Term Goal(s): Knowledge of disease and therapeutic regimen to maintain health will improve  Short Term Goals: Ability to remain free from injury will  improve, Ability to verbalize frustration and anger appropriately will improve, Ability to demonstrate self-control, Ability to participate in decision making will improve, Ability to verbalize feelings will improve, Ability to disclose and discuss suicidal ideas, Ability to identify and develop effective coping behaviors will improve, and Compliance with prescribed medications  will improve  Medication Management: RN will administer medications as ordered by provider, will assess and evaluate patient's response and provide education to patient for prescribed medication. RN will report any adverse and/or side effects to prescribing provider.  Therapeutic Interventions: 1 on 1 counseling sessions, Psychoeducation, Medication administration, Evaluate responses to treatment, Monitor vital signs and CBGs as ordered, Perform/monitor CIWA, COWS, AIMS and Fall Risk screenings as ordered, Perform wound care treatments as ordered.  Evaluation of Outcomes: Progressing   LCSW Treatment Plan for Primary Diagnosis: Major depressive disorder, recurrent episode, severe (HCC) Long Term Goal(s): Safe transition to appropriate next level of care at discharge, Engage patient in therapeutic group addressing interpersonal concerns.  Short Term Goals: Engage patient in aftercare planning with referrals and resources, Increase social support, Increase ability to appropriately verbalize feelings, Increase emotional regulation, Facilitate acceptance of mental health diagnosis and concerns, Facilitate patient progression through stages of change regarding substance use diagnoses and concerns, and Identify triggers associated with mental health/substance abuse issues  Therapeutic Interventions: Assess for all discharge needs, 1 to 1 time with Social worker, Explore available resources and support systems, Assess for adequacy in community support network, Educate family and significant other(s) on suicide prevention, Complete Psychosocial Assessment, Interpersonal group therapy.  Evaluation of Outcomes: Progressing   Progress in Treatment: Attending groups: Yes. Participating in groups: Yes. Taking medication as prescribed: Yes. Toleration medication: Yes. Family/Significant other contact made: No, will contact:  Family Member Patient understands diagnosis: Yes. Discussing patient identified  problems/goals with staff: Yes. Medical problems stabilized or resolved: Yes. Denies suicidal/homicidal ideation: Yes. Issues/concerns per patient self-inventory: Yes. Other: N/A  New problem(s) identified: No, Describe:  None reported  New Short Term/Long Term Goal(s): medication stabilization, elimination of SI thoughts, development of comprehensive mental wellness plan.    Patient Goals:  Medication  Discharge Plan or Barriers: Patient recently admitted. CSW will continue to follow and assess for appropriate referrals and possible discharge planning.    Reason for Continuation of Hospitalization: Anxiety Depression Medication stabilization Suicidal ideation  Estimated Length of Stay: 3-7 Days  Last 3 [place] Suicide Severity Risk Score: Flowsheet Row [facility]29/2023 in [facility] at [facility] Admission (Discharged) from 01/07/2019 in [facility]PAT[facility]  C-SSRS RISK CATEGORY High Risk High Risk Moderate Risk       Last PHQ 2/9 Scores:    09/24/2022    8:13 AM 07/09/2022   10:31 AM 10/28/2017   10:39 AM  Depression screen PHQ 2/9  Decreased Interest 1 0 1  Down, Depressed, Hopeless 0 0 1  PHQ - 2 Score 1 0 2  Altered sleeping   0  Tired, decreased energy   1  Change in appetite   0  Feeling bad or failure about yourself    1  Trouble concentrating   0  Moving slowly or fidgety/restless   0  Suicidal thoughts   0  PHQ-9 Score   4  Difficult doing work/chores   Somewhat difficult     medication stabilization, elimination of SI thoughts, development of comprehensive mental wellness plan.   Scribe for Treatment Team:  Ronnell Freshwater Danzig Macgregor, LCSW 05/01/2023 11:36 AM

## 2023-05-01 NOTE — Plan of Care (Signed)
Nurse discussed anxiety, depression and coping skills with patient.  

## 2023-05-01 NOTE — Progress Notes (Signed)
   05/01/23 2245  Psych Admission Type (Psych Patients Only)  Admission Status Involuntary  Psychosocial Assessment  Patient Complaints Anxiety  Eye Contact Fair  Facial Expression Animated  Affect Appropriate to circumstance  Speech Logical/coherent  Interaction Assertive  Motor Activity Other (Comment) (WDL)  Appearance/Hygiene Disheveled  Behavior Characteristics Appropriate to situation  Mood Pleasant  Thought Process  Coherency WDL  Content WDL  Delusions None reported or observed  Perception WDL  Hallucination None reported or observed  Judgment WDL  Confusion None  Danger to Self  Current suicidal ideation? Denies  Self-Injurious Behavior No self-injurious ideation or behavior indicators observed or expressed   Agreement Not to Harm Self Yes  Description of Agreement verbal  Danger to Others  Danger to Others None reported or observed

## 2023-05-01 NOTE — B[facility] Group Notes (Signed)
B[facility] Group Notes:  (Nursing/MHT/Case Management/Adjunct)  Date:  05/01/2023  Time:  8:14 PM  Type of Therapy:   AA group  Participation Level:  Active  Participation Quality:  Appropriate  Affect:  Appropriate  Cognitive:  Appropriate  Insight:  Appropriate  Engagement in Group:  Engaged  Modes of Intervention:  Education  Summary of Progress/Problems: Attended AA meeting.  Debbie Mueller 05/01/2023, 8:14 PM

## 2023-05-01 NOTE — Group Note (Signed)
Date:  05/01/2023 Time:  4:29 PM  Group Topic/Focus:  Orientation:   The focus of this group is to educate the patient on the purpose and policies of crisis stabilization and provide a format to answer questions about their admission.  The group details unit policies and expectations of patients while admitted.    Participation Level:  Active  Participation Quality:  Appropriate  Affect:  Appropriate  Cognitive:  Appropriate  Insight: Appropriate  Engagement in Group:  Engaged  Modes of Intervention:  Discussion  Additional Comments:     Reymundo Poll 05/01/2023, 4:29 PM

## 2023-05-01 NOTE — Group Note (Signed)
Recreation Therapy Group Note   Group Topic:Other  Group Date: 05/01/2023 Start Time: 1305 End Time: 1347 Facilitators: Jaycie Kregel-McCall, LRT,CTRS Location: [address] Dayroom   Activity Description/Intervention: Therapeutic Drumming. Patients with peers and staff were given the opportunity to engage in a leader facilitated [facility] Empowerment Drumming Circle with staff from the [employer], in partnership with [employer]. Community volunteer and trained drum facilitator, John Beck leading with LRT observing and documenting intervention and pt response. This evidenced-based practice targets 7 areas of health and wellbeing in the human experience including: stress-reduction, exercise, self-expression, camaraderie/support, nurturing, spirituality, and music-making (leisure).   Goal Area(s) Addresses:  Patient will engage in pro-social way in music group.  Patient will follow directions of drum leader on the first prompt. Patient will demonstrate no behavioral issues during group.  Patient will identify if a reduction in stress level occurs as a result of participation in therapeutic drum circle.    Affect/Mood: Appropriate   Participation Level: Engaged   Participation Quality: Independent   Behavior: Appropriate   Speech/Thought Process: Focused   Insight: Good   Judgement: Good   Modes of Intervention: Community Volunteer   Patient Response to Interventions:  Engaged   Education Outcome:  Acknowledges education   Clinical Observations/Individualized Feedback: Pt actively engaged in therapeutic drumming exercise and discussions. Pt was appropriate with peers, staff, and musical equipment for duration of programming.  Pt identified "relaxed" as their feeling after participation in music-based programming. Pt affect congruent/incongruent with verbalized emotion.     Plan: Continue to engage patient in RT group sessions  2-3x/week.   Quinlan Mcfall-McCall, LRT,CTRS 05/01/2023 2:23 PM 

## 2023-05-02 DIAGNOSIS — Z419 Encounter for procedure for purposes other than remedying health state, unspecified: Secondary | ICD-10-CM | POA: Diagnosis not present

## 2023-05-02 DIAGNOSIS — F332 Major depressive disorder, recurrent severe without psychotic features: Secondary | ICD-10-CM | POA: Diagnosis not present

## 2023-05-02 LAB — COMPREHENSIVE METABOLIC PANEL
ALT: 17 U/L (ref 0–44)
AST: 15 U/L (ref 15–41)
Albumin: 3.3 g/dL — ABNORMAL LOW (ref 3.5–5.0)
Alkaline Phosphatase: 73 U/L (ref 38–126)
Anion gap: 7 (ref 5–15)
BUN: 9 mg/dL (ref 6–20)
CO2: 26 mmol/L (ref 22–32)
Calcium: 8.3 mg/dL — ABNORMAL LOW (ref 8.9–10.3)
Chloride: 106 mmol/L (ref 98–111)
Creatinine, Ser: 0.81 mg/dL (ref 0.44–1.00)
GFR, Estimated: >60 mL/min (ref >60)
Glucose, Bld: 105 mg/dL — ABNORMAL HIGH (ref 70–99)
Potassium: 4.1 mmol/L (ref 3.5–5.1)
Sodium: 139 mmol/L (ref 135–145)
Total Bilirubin: <0.1 mg/dL — ABNORMAL LOW (ref 0.3–1.2)
Total Protein: 6.4 g/dL — ABNORMAL LOW (ref 6.5–8.1)

## 2023-05-02 LAB — HEMOGLOBIN A1C
Hgb A1c MFr Bld: 5.5 % (ref 4.8–5.6)
Mean Plasma Glucose: 111 mg/dL

## 2023-05-02 NOTE — Plan of Care (Signed)
Nurse discussed anxiety and coping skills with patient. 

## 2023-05-02 NOTE — Progress Notes (Signed)
Pt has scabbed over dime sized area to side of left breast that she would like doctor to see.  Pt states that it has been there for "a few months" and states that she has breast cancer in her family history.  Area appears scabbed over, nipple is symmetric, no signs of inflammation.      05/02/23 2219  Psych Admission Type (Psych Patients Only)  Admission Status Involuntary  Psychosocial Assessment  Patient Complaints Anxiety  Eye Contact Fair  Facial Expression Anxious  Affect Appropriate to circumstance  Speech Logical/coherent  Interaction Assertive  Motor Activity Other (Comment) (WDL)  Appearance/Hygiene Unremarkable  Behavior Characteristics Appropriate to situation  Mood Anxious;Pleasant  Thought Process  Coherency WDL  Content WDL  Delusions None reported or observed  Perception WDL  Hallucination None reported or observed  Judgment WDL  Confusion None  Danger to Self  Current suicidal ideation? Denies  Self-Injurious Behavior No self-injurious ideation or behavior indicators observed or expressed   Agreement Not to Harm Self Yes  Description of Agreement verbal  Danger to Others  Danger to Others None reported or observed
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
## 2023-05-02 NOTE — Group Note (Signed)
LCSW Group Therapy Note  05/02/2023   10:30-11:30am   Topic:  Anger and its Underlying Emotions  Participation Level:  Active  Description of Group:   In this group, patients identified the last situation that provoked them to anger.   We then talked about the Anger Iceberg and the way many underlying feelings lead to feelings of anger.  Focus was placed on how helpful it is to recognize the underlying emotions to anger in order to address these for more permanent resolution.  Because the group was supposed to be on social wellness, CSW continually made the connection between our ability to recognize our feelings and our ability to convey our emotional needs to those around [place] in a more helpful manner.    Therapeutic Goals: Patients will share emotions that commonly incite their anger and how they typically respond Patients will identify how their coping skills work for them and/or against them Patients will explore possible alternative thoughts to their automatic ones Patients will learn that anger itself is normal and that healthier reactions can assist with resolving conflict rather than worsening situations  Summary of Patient Progress:  The patient shared that her recent situation/feeling that led to anger was when her son was taken away from her in November and she has not been allowed to see him since then.  She is annoyed by the fact that her family does not understand why she cannot find a job or housing.  She claimed that she has overheard her family telling her daughter that they wish patient would die.  Patient indicated a willingness to try working on the underlying emotions in order to feel better both in the short-term and in the long-run.  Patient verbalized an understanding that their social relationships and other people's ability to support them in the ways needed can be deeply influenced by their ability to identify the feelings, the thoughts that generated those feelings, and the  actions that came out of those feelings.  Patient also appeared to comprehend when CSW talked about this being a 2-way communication with possible mistakes on both sides.      Therapeutic Modalities:   Cognitive Behavioral Therapy Processing  Lynnell Chad, MSW, LCSW

## 2023-05-02 NOTE — Progress Notes (Signed)
D:  Patient's self inventory sheet, patient sleeps good, sleep medication helpful.  Good appetite, normal energy level, good concentration.  Rated depression 6, hopeless 3, anxiety 7.  Denied withdrawals.  Denied SI.  Physical problems, rash.  Physical pain, back, headaches.  Pain medicine helpful.  Goal is decrease depression, get kids back.  Be less stressed, be strong, stay focused.  Will discuss L breast situation with MD.  Does have discharge plans. A:  Medications administered per MD orders.  Emotional support and encouragement given patient. R:  Denied SI and HI, contracts for safety.  Denied A/V hallucinations.  Safety maintained with 15 minute checks.

## 2023-05-02 NOTE — [facility] Group Notes (Signed)
[facility] Group Notes:  (Nursing/MHT/Case Management/Adjunct)  Date:  05/02/2023  Time:  2000  Type of Therapy:   wrap up group  Participation Level:  Active  Participation Quality:  Appropriate, Attentive, Sharing, and Supportive  Affect:  Appropriate  Cognitive:  Alert  Insight:  Improving  Engagement in Group:  Engaged  Modes of Intervention:  Clarification, Education, and Socialization  Summary of Progress/Problems: Positive thinking and self-care were discussed.   Johann Capers S 05/02/2023, 9:08 PM

## 2023-05-02 NOTE — Progress Notes (Signed)
Patient stated her L breast is sore, itching, will discuss with MD/NP today.

## 2023-05-03 DIAGNOSIS — F332 Major depressive disorder, recurrent severe without psychotic features: Secondary | ICD-10-CM | POA: Diagnosis not present

## 2023-05-03 MED ORDER — VENLAFAXINE HCL ER 150 MG PO CP24
150.0000 mg | ORAL_CAPSULE | Freq: Every day | ORAL | Status: DC
  Administered 2023-05-04 – 2023-05-06 (×2): 150 mg via ORAL
  Filled 2023-05-03 (×4): qty 1

## 2023-05-03 NOTE — Group Note (Signed)
Date:  05/03/2023 Time:  1:12 PM  Group Topic/Focus:  Goals Group:   The focus of this group is to help patients establish daily goals to achieve during treatment and discuss how the patient can incorporate goal setting into their daily lives to aide in recovery. Orientation:   The focus of this group is to educate the patient on the purpose and policies of crisis stabilization and provide a format to answer questions about their admission.  The group details unit policies and expectations of patients while admitted.    Participation Level:  Active  Participation Quality:  Appropriate  Affect:  Appropriate  Cognitive:  Appropriate  Insight: Appropriate  Engagement in Group:  Engaged  Modes of Intervention:  Discussion, Orientation, and Support  Additional Comments:   Pt attended and participated in the Orientation/Goals group. Pt personal goal is to set healthy boundaries and better personal goals.  Debbie Mueller 05/03/2023, 1:12 PM

## 2023-05-03 NOTE — Progress Notes (Signed)
   05/03/23 2159  Psych Admission Type (Psych Patients Only)  Admission Status Involuntary  Psychosocial Assessment  Patient Complaints None  Eye Contact Fair  Facial Expression Animated  Affect Appropriate to circumstance  Speech Logical/coherent  Interaction Assertive  Motor Activity Other (Comment) (WDL)  Appearance/Hygiene Unremarkable  Behavior Characteristics Appropriate to situation  Mood Pleasant  Thought Process  Coherency WDL  Content WDL  Delusions None reported or observed  Perception WDL  Hallucination None reported or observed  Judgment WDL  Confusion None  Danger to Self  Current suicidal ideation? Denies  Self-Injurious Behavior No self-injurious ideation or behavior indicators observed or expressed   Agreement Not to Harm Self Yes  Description of Agreement verbal  Danger to Others  Danger to Others None reported or observed

## 2023-05-03 NOTE — Progress Notes (Signed)
Nurse discussed anxiety, depression and coping skills with patient.  

## 2023-05-03 NOTE — Progress Notes (Addendum)
D:  Patient's self inventory sheet, patient sleeps good, sleep medication helpful.  Good appetite , normal energy level, good concentration.  Rated depression and hopeless 4, anxiety 5.  Denied withdrawals.   Denied SI.  Physical problems, rash left breast.  Goal is work toward goals, fix anger and anxiety.  Get better for children and family, talk to someone.  Plans to walk, do exercises.  Does have discharge plans. A:  Medications administered per MD orders.  Emotional support and encouragement given patient.   R:  Denied SI and HI, contracts for safety.  Denied A/V hallucinations.  Does have discharge plans.

## 2023-05-03 NOTE — [facility] Group Notes (Signed)
[facility] Group Notes:  (Nursing)  Date:  05/03/2023  Time:  1415  Type of Therapy:  Psychoeducational Skills  Participation Level:  Minimal  Participation Quality:  Attentive  Affect:  Appropriate  Cognitive:  Appropriate  Insight:  Appropriate  Engagement in Group:  Limited  Modes of Intervention:  Activity, Exploration, and Rapport Building  Summary of Progress/Problems:  Debbie Mueller 05/03/2023, 3:17 PM

## 2023-05-03 NOTE — Group Note (Signed)
Date:  05/03/2023 Time:  4:49 PM  Group Topic/Focus:  Building Self Esteem:   The Focus of this group is helping patients become aware of the effects of self-esteem on their lives, the things they and others do that enhance or undermine their self-esteem, seeing the relationship between their level of self-esteem and the choices they make and learning ways to enhance self-esteem. Dimensions of Wellness:   The focus of this group is to introduce the topic of wellness and discuss the role each dimension of wellness plays in total health. Emotional Education:   The focus of this group is to discuss what feelings/emotions are, and how they are experienced.    Participation Level:  Active  Participation Quality:  Appropriate  Affect:  Appropriate  Cognitive:  Appropriate  Insight: Appropriate  Engagement in Group:  Engaged  Modes of Intervention:  Activity, Education, and Support  Additional Comments:   Pt attended and participated in the Emotional Wellness group. Pt completed the self-esteem and self- love building activity.  Debbie Mueller 05/03/2023, 4:49 PM
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
## 2023-05-03 NOTE — Progress Notes (Signed)
Patient stated MD will write order for medication for L breast problem.

## 2023-05-04 ENCOUNTER — Encounter (HOSPITAL_COMMUNITY): Payer: Self-pay | Admitting: Psychiatry

## 2023-05-04 DIAGNOSIS — F332 Major depressive disorder, recurrent severe without psychotic features: Secondary | ICD-10-CM | POA: Diagnosis not present

## 2023-05-04 NOTE — Progress Notes (Signed)
Adult Psychoeducational Group Note  Date:  05/04/2023 Time:  1:07 AM  Group Topic/Focus:  Wrap-Up Group:   The focus of this group is to help patients review their daily goal of treatment and discuss progress on daily workbooks.  Participation Level:  Active  Participation Quality:  Appropriate  Affect:  Appropriate  Cognitive:  Appropriate  Insight: Appropriate  Engagement in Group:  Engaged  Modes of Intervention:  Discussion and Support  Additional Comments:  Pt states goal today, was to be less stressed and control anxiety and mood. Pt states something positive that happened today, was going outside and working on crossword puzzles. Tomorrow, pt wants to work on being less stressed and working on anger.  Debbie Mueller 05/04/2023, 1:07 AM

## 2023-05-04 NOTE — Progress Notes (Signed)
   05/04/23 2100  Psych Admission Type (Psych Patients Only)  Admission Status Involuntary  Psychosocial Assessment  Patient Complaints Anxiety  Eye Contact Fair  Facial Expression Anxious  Affect Appropriate to circumstance  Speech Logical/coherent  Interaction Assertive  Motor Activity Other (Comment)  Appearance/Hygiene Unremarkable  Behavior Characteristics Appropriate to situation  Mood Anxious  Thought Process  Coherency WDL  Content WDL  Delusions None reported or observed  Perception WDL  Hallucination None reported or observed  Judgment WDL  Confusion None  Danger to Self  Current suicidal ideation? Denies  Agreement Not to Harm Self Yes  Description of Agreement contracts for safety  Danger to Others  Danger to Others None reported or observed   Alert/oriented. Makes needs/concerns known to staff. Pleasant cooperative with staff. Denies SI/HI/A/V hallucinations. Med compliant. PRN med given with good effect. Patient states went to group. Will encourage  compliance and progression towards goals. Verbally contracted for safety. Will continue to monitor.

## 2023-05-04 NOTE — Progress Notes (Signed)
D:  Patient's self inventory sheet, patient sleeps good, sleep medication helpful.  Good appetite, eats too much.  Normal energy, good concentration.  Rated depression 6, hopeless 4, anxiety 7.  Denied withdrawals.  Denied SI.  Denied physical problems.  Denied physical pain.  Goal is focus on mental health. Anxiety, work hard to get new place to live.  Plans to look for housing resources, take meds.  Will get resources for housing and find employment.  Does have discharge plans. A:  Medications administered per MD orders.  Emotional support and encouragement given patient. R:  Denied SI and HI, contracts for safety.  Denied A/V hallucinations.  Safety maintained with 15 minute checks.

## 2023-05-04 NOTE — Group Note (Signed)
Date:  05/04/2023 Time:  9:56 AM  Group Topic/Focus:  Goals Group:   The focus of this group is to help patients establish daily goals to achieve during treatment and discuss how the patient can incorporate goal setting into their daily lives to aide in recovery.    Participation Level:  Active  Participation Quality:  Appropriate  Affect:  Appropriate  Cognitive:  Appropriate  Insight: Appropriate  Engagement in Group:  Distracting  Modes of Intervention:  Discussion  Additional Comments:     Reymundo Poll 05/04/2023, 9:56 AM

## 2023-05-04 NOTE — [facility] Suicide Risk Assessment (Signed)
[facility] INPATIENT:  Family/Significant Other Suicide Prevention Education  Suicide Prevention Education:  Patient Refusal for Family/Significant Other Suicide Prevention Education: The patient Debbie Mueller has refused to provide written consent for family/significant other to be provided Family/Significant Other Suicide Prevention Education during admission and/or prior to discharge.  Physician notified.  Corky Crafts 05/04/2023, 10:34 AM

## 2023-05-04 NOTE — Group Note (Signed)
Recreation Therapy Group Note   Group Topic:Team Building  Group Date: 05/04/2023 Start Time: 0930 End Time: 0957 Facilitators: Zareena Willis-McCall, LRT,CTRS Location: [address] Day[address]   Goal Area(s) Addresses:  Patient will effectively work with peer towards shared goal.  Patient will identify skills used to make activity successful.  Patient will identify how skills used during activity can be applied to reach post d/c goals.   Group Description: [employer]. In teams of 5-6, patients were given 11 craft pipe cleaners. Using the materials provided, patients were instructed to compete again the opposing team(s) to build the tallest free-standing structure from floor level. The activity was timed; difficulty increased by Clinical research associate as Production designer, theatre/television/film continued.  Systematically resources were removed with additional directions for example, placing one arm behind their back, working in silence, and shape stipulations. LRT facilitated post-activity discussion reviewing team processes and necessary communication skills involved in completion. Patients were encouraged to reflect how the skills utilized, or not utilized, in this activity can be incorporated to positively impact support systems post discharge.   Affect/Mood: Appropriate   Participation Level: Engaged   Participation Quality: Independent   Behavior: Appropriate   Speech/Thought Process: Focused   Insight: Good   Judgement: Good   Modes of Intervention: STEM Activity   Patient Response to Interventions:  Engaged   Education Outcome:  Acknowledges education   Clinical Observations/Individualized Feedback: Pt attended and participated in group session.     Plan: Continue to engage patient in RT group sessions 2-3x/week.   Teriah Muela-McCall, LRT,CTRS  05/04/2023 12:33 PM
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
## 2023-05-04 NOTE — Progress Notes (Signed)
Psychoeducational Group Note  Date:  05/04/2023 Time:  2118  Group Topic/Focus:  Relapse Prevention Planning:   The focus of this group is to define relapse and discuss the need for planning to combat relapse.  Participation Level: Did Not Attend  Participation Quality:  Not Applicable  Affect:  Not Applicable  Cognitive:  Not Applicable  Insight:  Not Applicable  Engagement in Group: Not Applicable  Additional Comments:  The patient did not attend group this evening.   Hazle Coca S 05/04/2023, 9:19 PM

## 2023-05-04 NOTE — Plan of Care (Signed)
  Problem: Safety: Goal: Periods of time without injury will increase Outcome: Progressing   

## 2023-05-04 NOTE — Plan of Care (Signed)
Nurse discussed anxiety, depression and coping skills with patient.  
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
## 2023-05-05 DIAGNOSIS — F332 Major depressive disorder, recurrent severe without psychotic features: Secondary | ICD-10-CM | POA: Diagnosis not present

## 2023-05-05 MED ORDER — ONDANSETRON 4 MG PO TBDP
ORAL_TABLET | ORAL | Status: AC
  Filled 2023-05-05: qty 1

## 2023-05-05 MED ORDER — ONDANSETRON 4 MG PO TBDP
4.0000 mg | ORAL_TABLET | ORAL | Status: DC | PRN
  Administered 2023-05-05: 4 mg via ORAL
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
## 2023-05-05 NOTE — Progress Notes (Addendum)
D. Pt complained of not feeling well today-nausea, feeling achy- reported that she wasn't able to keep breakfast or lunch down. Pt remained in her room for most of the shift resting in bed. Pt currently denies SI/HI and AVH and does not appear to be responding to internal stimuli. A. Labs and vitals monitored. Pt given po fluids and prn zofran for c/o nausea. Pt supported emotionally and encouraged to express concerns and ask questions.   R. Pt remains safe with 15 minute checks. Will continue POC.    05/05/23 1200  Psych Admission Type (Psych Patients Only)  Admission Status Involuntary  Psychosocial Assessment  Patient Complaints Anxiety  Eye Contact Fair  Facial Expression Anxious  Affect Appropriate to circumstance  Speech Logical/coherent  Interaction Assertive  Motor Activity Other (Comment) (steady gait)  Appearance/Hygiene Unremarkable  Behavior Characteristics Cooperative;Appropriate to situation  Mood Anxious  Thought Process  Coherency WDL  Content WDL  Delusions None reported or observed  Perception WDL  Hallucination None reported or observed  Judgment WDL  Confusion None  Danger to Self  Current suicidal ideation? Denies  Agreement Not to Harm Self Yes  Description of Agreement verbal contract for safety  Danger to Others  Danger to Others None reported or observed

## 2023-05-05 NOTE — Progress Notes (Signed)
   05/05/23 2023  Psych Admission Type (Psych Patients Only)  Admission Status Involuntary  Psychosocial Assessment  Patient Complaints Anxiety  Eye Contact Fair  Facial Expression Anxious  Affect Appropriate to circumstance  Speech Logical/coherent  Interaction Assertive  Motor Activity Other (Comment)  Appearance/Hygiene Unremarkable  Behavior Characteristics Cooperative;Appropriate to situation  Mood Anxious  Thought Process  Coherency WDL  Content WDL  Delusions None reported or observed  Perception WDL  Hallucination None reported or observed  Judgment WDL  Confusion None  Danger to Self  Current suicidal ideation? Denies  Agreement Not to Harm Self Yes  Description of Agreement contracts for safety  Danger to Others  Danger to Others None reported or observed   Alert/oriented. Makes needs/concerns known to staff. Pleasant cooperative with staff. Denies SI/HI/A/V hallucinations.  Patient states went to group. Will encourage compliance and progression towards goals. Verbally contracted for safety. Will continue to monitor.

## 2023-05-05 NOTE — Plan of Care (Signed)
  Problem: Safety: Goal: Periods of time without injury will increase Outcome: Progressing   

## 2023-05-05 NOTE — [facility] Group Notes (Signed)
Spiritual care group on grief and loss facilitated by Chaplain Dyanne Carrel, [facility]  Group Goal: Support / Education around grief and loss  Members engage in facilitated group support and psycho-social education.  Group Description:  Following introductions and group rules, group members engaged in facilitated group dialogue and support around topic of loss, with particular support around experiences of loss in their lives. Group Identified types of loss (relationships / self / things) and identified patterns, circumstances, and changes that precipitate losses. Reflected on thoughts / feelings around loss, normalized grief responses, and recognized variety in grief experience. Group encouraged individual reflection on safe space and on the coping skills that they are already utilizing.  Group drew on Adlerian / Rogerian and narrative framework  Patient Progress: Debbie Mueller attended group and actively engaged and participated in group conversation and activities.  Her comments demonstrated good insight into the topic and she was supportive of peers.

## 2023-05-05 NOTE — Group Note (Signed)
Date:  05/05/2023 Time:  2:17 PM  Group Topic/Focus:  Emotional Education:   The focus of this group is to discuss what feelings/emotions are, and how they are experienced.    Participation Level:  Did Not Attend  Participation Quality:    Affect:    Cognitive:    Insight:   Engagement in Group:    Modes of Intervention:    Additional Comments:    Yee Gangi M Rozanna Cormany 05/05/2023, 2:17 PM  

## 2023-05-05 NOTE — Group Note (Signed)
Recreation Therapy Group Note   Group Topic:Animal Assisted Therapy   Group Date: 05/05/2023 Start Time: 0945 End Time: 1030 Facilitators: Clearnce Leja-McCall, LRT,CTRS Location: [address] Dayroom   Animal-Assisted Activity (AAA) Program Checklist/Progress Notes Patient Eligibility Criteria Checklist & Daily Group note for Rec Tx Intervention  AAA/T Program Assumption of Risk Form signed by Patient/ or Parent Legal Guardian Yes  Patient understands his/her participation is voluntary Yes   Affect/Mood: N/A   Participation Level: Did not attend    Clinical Observations/Individualized Feedback:    Plan: Continue to engage patient in RT group sessions 2-3x/week.   Jenya Putz-McCall, LRT,CTRS 05/05/2023 12:16 PM

## 2023-05-06 DIAGNOSIS — F332 Major depressive disorder, recurrent severe without psychotic features: Secondary | ICD-10-CM | POA: Diagnosis not present

## 2023-05-06 MED ORDER — NICOTINE 14 MG/24HR TD PT24: 14.0000 mg | MEDICATED_PATCH | Freq: Every day | TRANSDERMAL | 0 refills | Status: DC

## 2023-05-06 MED ORDER — VENLAFAXINE HCL ER 150 MG PO CP24: 150.0000 mg | ORAL_CAPSULE | Freq: Every day | ORAL | 0 refills | Status: DC

## 2023-05-06 MED ORDER — TRAZODONE HCL 50 MG PO TABS: 50.0000 mg | ORAL_TABLET | Freq: Every evening | ORAL | 0 refills | Status: DC | PRN

## 2023-05-06 MED ORDER — PRAZOSIN HCL 1 MG PO CAPS: 1.0000 mg | ORAL_CAPSULE | Freq: Every day | ORAL | 0 refills | Status: DC

## 2023-05-06 NOTE — Group Note (Unsigned)
Date:  05/06/2023 Time:  10:08 AM  Group Topic/Focus:  Goals Group:   The focus of this group is to help patients establish daily goals to achieve during treatment and discuss how the patient can incorporate goal setting into their daily lives to aide in recovery. Orientation:   The focus of this group is to educate the patient on the purpose and policies of crisis stabilization and provide a format to answer questions about their admission.  The group details unit policies and expectations of patients while admitted.     Participation Level:  {[facility] PARTICIPATION LEVEL:22264}  Participation Quality:  {[facility] PARTICIPATION QUALITY:22265}  Affect:  {[facility] AFFECT:22266}  Cognitive:  {[facility] COGNITIVE:22267}  Insight: {[facility] Insight2:20797}  Engagement in Group:  {[facility] ENGAGEMENT IN GROUP:22268}  Modes of Intervention:  {[facility] MODES OF INTERVENTION:22269}  Additional Comments:  ***  Debbie Mueller 05/06/2023, 10:08 AM  

## 2023-05-06 NOTE — Progress Notes (Signed)
Pt left facillity at this time. Pt removed all belongings, and verbalized understanding of medications and discharge instructions. Pt left facility via taxi. Pt denies SI/HI/AVH.

## 2023-05-06 NOTE — Discharge Summary (Signed)
Physician Discharge Summary Note  Patient:  Debbie Mueller is an 42 y.o., female MRN:  [id number] DOB:  [birth year] Patient phone:  [phone] (home)  Patient address:   [address] [place] [address],  Total Time spent with patient: 30 minutes  Date of Admission:  04/30/2023 Date of Discharge: 05/06/2023  Reason for Admission:  Debbie Mueller is a 72 y.o., female with a past psychiatric history significant for PTSD, MDD, GAD who presents to the [facility] from [facility] under IVC for evaluation and management of depression with recent suicidal ideation with previously admitted plan of slitting her wrists.   Principal Problem: Major depressive disorder, recurrent episode, severe (HCC) Discharge Diagnoses: Principal Problem:   Major depressive disorder, recurrent episode, severe (HCC) Active Problems:   MDD (major depressive disorder), recurrent severe, without psychosis (HCC)   Past Psychiatric History: Please see H&P.  Past Medical History:  Past Medical History:  Diagnosis Date   Acid reflux    Anxiety    Anxiety disorder 2015   Depression    PP depression   Loss of teeth due to gum disease    wears dentures   Overactive bladder    Panic attacks 2015   PTSD (post-traumatic stress disorder) 2015   Scoliosis    Vitamin D deficiency     Past Surgical History:  Procedure Laterality Date   CESAREAN SECTION     TUBAL LIGATION Bilateral 04/30/2018   Procedure: POST PARTUM TUBAL LIGATION;  Surgeon: Catalina Antigua, MD;  Location: [facility];  Service: Gynecology;  Laterality: Bilateral;   Family History:  Family History  Problem Relation Age of Onset   Diabetes Paternal Grandfather    Hypertension Paternal Grandfather    Diabetes Paternal Grandmother    Hypertension Paternal Grandmother    Heart attack Maternal Grandmother    Heart attack Maternal Grandfather    Anemia Father    Hypertension Father    Breast cancer Mother     Depression Brother    Other Son        acid reflux   Family Psychiatric  History: Please see H&P Social History:  Social History   Substance and Sexual Activity  Alcohol Use Yes   Comment: reports drinking 2 standard drinks 2-3x weekly     Social History   Substance and Sexual Activity  Drug Use Not Currently    Social History   Socioeconomic History   Marital status: Single    Spouse name: Not on file   Number of children: Not on file   Years of education: Not on file   Highest education level: Not on file  Occupational History   Not on file  Tobacco Use   Smoking status: Some Days    Packs/day: 0.01    Years: 10.00    Additional pack years: 0.00    Total pack years: 0.10    Types: Cigarettes   Smokeless tobacco: Never   Tobacco comments:    1x a week  Vaping Use   Vaping Use: Never used  Substance and Sexual Activity   Alcohol use: Yes    Comment: reports drinking 2 standard drinks 2-3x weekly   Drug use: Not Currently   Sexual activity: Yes    Birth control/protection: Surgical    Comment: tubal  Other Topics Concern   Not on file  Social History Narrative   Not on file   Social Determinants of Health   Financial Resource Strain: Not  on file  Food Insecurity: No Food Insecurity (04/30/2023)   Hunger Vital Sign    Worried About Running Out of Food in the Last Year: Never true    Ran Out of Food in the Last Year: Never true  Transportation Needs: Unmet Transportation Needs (04/30/2023)   PRAPARE - Administrator, Civil Service (Medical): Yes    Lack of Transportation (Non-Medical): Yes  Physical Activity: Not on file  Stress: Not on file  Social Connections: Not on file    Hospital Course:  During the patient's hospitalization, patient had extensive initial psychiatric evaluation, and follow-up psychiatric evaluations every day.  Psychiatric diagnoses provided upon initial assessment: Major depression, recurrent with suicidal  ideations  Patient's psychiatric medications were adjusted on admission: Patient was started on venlafaxine and Minipress.  During the hospitalization, other adjustments were made to the patient's psychiatric medication regimen: Medications were titrated as tolerated.  Patient's care was discussed during the interdisciplinary team meeting every day during the hospitalization.  The patient denied having side effects to prescribed psychiatric medication.  Gradually, patient started adjusting to milieu. The patient was evaluated each day by a clinical provider to ascertain response to treatment. Improvement was noted by the patient's report of decreasing symptoms, improved sleep and appetite, affect, medication tolerance, behavior, and participation in unit programming.  Patient was asked each day to complete a self inventory noting mood, mental status, pain, new symptoms, anxiety and concerns.    Symptoms were reported as significantly decreased or resolved completely by discharge.   On day of discharge, the patient reports that their mood is stable. The patient denied having suicidal thoughts for more than 48 hours prior to discharge.  Patient denies having homicidal thoughts.  Patient denies having auditory hallucinations.  Patient denies any visual hallucinations or other symptoms of psychosis. The patient was motivated to continue taking medication with a goal of continued improvement in mental health.   The patient reports their target psychiatric symptoms of depression and anxiety responded well to the psychiatric medications, and the patient reports overall benefit other psychiatric hospitalization. Supportive psychotherapy was provided to the patient. The patient also participated in regular group therapy while hospitalized. Coping skills, problem solving as well as relaxation therapies were also part of the unit programming.  Labs were reviewed with the patient, and abnormal results were  discussed with the patient.  The patient is able to verbalize their individual safety plan to this provider.  # It is recommended to the patient to continue psychiatric medications as prescribed, after discharge from the hospital.    # It is recommended to the patient to follow up with your outpatient psychiatric provider and PCP.  # It was discussed with the patient, the impact of alcohol, drugs, tobacco have been there overall psychiatric and medical wellbeing, and total abstinence from substance use was recommended the patient.ed.  # Prescriptions provided or sent directly to preferred pharmacy at discharge. Patient agreeable to plan. Given opportunity to ask questions. Appears to feel comfortable with discharge.    # In the event of worsening symptoms, the patient is instructed to call the crisis hotline, 911 and or go to the nearest ED for appropriate evaluation and treatment of symptoms. To follow-up with primary care provider for other medical issues, concerns and or health care needs  # Patient was discharged to a shelter with a plan to follow up as noted below.  Patient reports that she is currently homeless.  She was unable to  afford her apartment which she had given up.  She does have 3 children aged 88, 49 and 40.  She hopes to go to a shelter first and then contact her grandparents.  If she can stay with the grandparents she would like to try to take care of her 40 year old and 40-year-old with the help of the grandparents.  She states that has 72-year-old son is in foster care.  Her 56 year old daughter is with her Aunt.  Today she is contracting for safety and seems ready for discharge.  Physical Findings: AIMS:  , ,  ,  ,    CIWA:    COWS:     Musculoskeletal: Strength & Muscle Tone: within normal limits Gait & Station: normal Patient leans: N/A   Psychiatric Specialty Exam:  Presentation  General Appearance:  Appropriate for Environment  Eye  Contact: Fair  Speech: Clear and Coherent  Speech Volume: Normal  Handedness: Right   Mood and Affect  Mood: Euthymic  Affect: Appropriate   Thought Process  Thought Processes: Linear  Descriptions of Associations:Intact  Orientation:Full (Time, Place and Person)  Thought Content:Logical  History of Schizophrenia/Schizoaffective disorder:No data recorded Duration of Psychotic Symptoms:No data recorded Hallucinations:Hallucinations: None  Ideas of Reference:None  Suicidal Thoughts:Suicidal Thoughts: No  Homicidal Thoughts:Homicidal Thoughts: No   Sensorium  Memory: Immediate Fair; Remote Fair; Recent Fair  Judgment: Fair  Insight: Fair   Art therapist  Concentration: Fair  Attention Span: Fair  Recall: [employer] of Knowledge: Fair  Language: Fair   Psychomotor Activity  Psychomotor Activity: Psychomotor Activity: Normal   Assets  Assets: Communication Skills; Desire for Improvement   Sleep  Sleep: Sleep: Fair    Physical Exam: Physical Exam Constitutional:      Appearance: Normal appearance.  Neurological:     Mental Status: She is alert.  Psychiatric:        Mood and Affect: Mood normal.        Behavior: Behavior normal.    Review of Systems  Psychiatric/Behavioral: Negative.    All other systems reviewed and are negative.  Blood pressure 106/75, pulse (!) 115, temperature 99.5 F (37.5 C), temperature source Oral, resp. rate 18, height 5\' 1"  (1.549 m), weight 115.7 kg, last menstrual period 04/25/2023, SpO2 100 %. Body mass index is 48.18 kg/m.   Social History   Tobacco Use  Smoking Status Some Days   Packs/day: 0.01   Years: 10.00   Additional pack years: 0.00   Total pack years: 0.10   Types: Cigarettes  Smokeless Tobacco Never  Tobacco Comments   1x a week   Tobacco Cessation:  A prescription for an FDA-approved tobacco cessation medication provided at discharge   Blood Alcohol level:   Lab Results  Component Value Date   ETH <10 04/29/2023   ETH <10 01/07/2019    Metabolic Disorder Labs:  Lab Results  Component Value Date   HGBA1C 5.5 05/01/2023   MPG 111 05/01/2023   No results found for: "PROLACTIN" Lab Results  Component Value Date   CHOL 170 07/15/2022   TRIG 179 (H) 07/15/2022   HDL 38 (L) 07/15/2022   CHOLHDL 4.5 (H) 07/15/2022   LDLCALC 101 (H) 07/15/2022    See Psychiatric Specialty Exam and Suicide Risk Assessment completed by Attending Physician prior to discharge.  Discharge destination:  [facility]  Is patient on multiple antipsychotic therapies at discharge:  No   Has Patient had three or more failed trials of antipsychotic monotherapy by history:  No  Recommended Plan for Multiple Antipsychotic Therapies: NA  Discharge Instructions     Diet - low sodium heart healthy   Complete by: As directed    Increase activity slowly   Complete by: As directed       Allergies as of 05/06/2023       Reactions   Penicillins Rash   Did it involve swelling of the face/tongue/throat, SOB, or low BP? Unknown Did it involve sudden or severe rash/hives, skin peeling, or any reaction on the inside of your mouth or nose? Unknown Did you need to seek medical attention at a hospital or doctor's office? Unknown When did it last happen?    Unknown   If all above answers are "NO", may proceed with cephalosporin use.        Medication List     STOP taking these medications    citalopram 40 MG tablet Commonly known as: CELEXA   escitalopram 20 MG tablet Commonly known as: LEXAPRO   mupirocin ointment 2 % Commonly known as: BACTROBAN   pantoprazole 20 MG tablet Commonly known as: PROTONIX       TAKE these medications      Indication  acetaminophen 500 MG tablet Commonly known as: TYLENOL Take 1,000 mg by mouth every 6 (six) hours as needed for moderate pain or headache.  Indication: Pain   nicotine 14 mg/24hr patch Commonly known as:  NICODERM CQ - dosed in mg/24 hours Place 1 patch (14 mg total) onto the skin daily at 6 (six) AM. Start taking on: May 07, 2023  Indication: Nicotine Addiction   prazosin 1 MG capsule Commonly known as: MINIPRESS Take 1 capsule (1 mg total) by mouth at bedtime.  Indication: Frightening Dreams   traZODone 50 MG tablet Commonly known as: DESYREL Take 1 tablet (50 mg total) by mouth at bedtime as needed for sleep.  Indication: Trouble Sleeping   venlafaxine XR 150 MG 24 hr capsule Commonly known as: EFFEXOR-XR Take 1 capsule (150 mg total) by mouth daily with breakfast. Start taking on: May 07, 2023  Indication: Major Depressive Disorder   Vitamin D (Ergocalciferol) 1.25 MG (50000 UNIT) Caps capsule Commonly known as: DRISDOL Take 1 capsule (50,000 Units total) by mouth every 7 (seven) days.  Indication: Vitamin D Deficiency        Follow-up Information     [facility], [facility]. Go on 05/08/2023.   Why: You have an assessment on 05/08/23 at 9:00 am, to obtain appointments for therapy and medication management services.  The appointment will be held in person.  Please bring photo ID, and insurance card with you. Contact information: [address] [place] [address] [phone]                 Follow-up recommendations:  Activity:  As tolerated  Comments: The patient seems stable today.  She denies any symptoms of GI distress or nausea.  She is tolerating her medications.  She rates her depression at a 3/10 and suicidal ideations as 0/10.  She is somewhat anxious about discharge but hopes to be able to contact her grandparents upon discharge.  She is strongly encouraged to comply with treatment and follow-up with outpatient treatment at [facility].  Prognosis is fair to guarded.  Signed: Rex Kras, MD 05/06/2023, 8:59 AM Total Time Spent in Direct Patient Care:  I personally spent 30 minutes on the unit in direct patient care. The direct patient care time  included face-to-face time with the patient, reviewing the  patient's chart, communicating with other professionals, and coordinating care. Greater than 50% of this time was spent in counseling or coordinating care with the patient regarding goals of hospitalization, psycho-education, and discharge planning needs.   Rulon Eisenmenger [facility] Psychiatrist

## 2023-05-06 NOTE — Group Note (Signed)
Date:  05/06/2023 Time:  10:24 AM  Group Topic/Focus:  Goals Group:   The focus of this group is to help patients establish daily goals to achieve during treatment and discuss how the patient can incorporate goal setting into their daily lives to aide in recovery. Orientation:   The focus of this group is to educate the patient on the purpose and policies of crisis stabilization and provide a format to answer questions about their admission.  The group details unit policies and expectations of patients while admitted.    Participation Level:  Active  Participation Quality:  Appropriate  Affect:  Appropriate  Cognitive:  Appropriate  Insight: Appropriate  Engagement in Group:  Engaged  Modes of Intervention:  Discussion  Additional Comments:    Jaquita Rector 05/06/2023, 10:24 AM

## 2023-05-06 NOTE — [facility] Suicide Risk Assessment (Signed)
[facility] Discharge Suicide Risk Assessment   Principal Problem: Major depressive disorder, recurrent episode, severe (HCC) Discharge Diagnoses: Principal Problem:   Major depressive disorder, recurrent episode, severe (HCC) Active Problems:   MDD (major depressive disorder), recurrent severe, without psychosis (HCC)   Total Time spent with patient: 30 minutes  Musculoskeletal: Strength & Muscle Tone: within normal limits Gait & Station: normal Patient leans: N/A  Psychiatric Specialty Exam  Presentation  General Appearance:  Casual; Disheveled  Eye Contact: Fair  Speech: Clear and Coherent  Speech Volume: Decreased  Handedness: Right   Mood and Affect  Mood: Anxious; Depressed  Duration of Depression Symptoms: No data recorded Affect: Constricted   Thought Process  Thought Processes: Coherent  Descriptions of Associations:Intact  Orientation:Full (Time, Place and Person)  Thought Content:Logical  History of Schizophrenia/Schizoaffective disorder:No data recorded Duration of Psychotic Symptoms:No data recorded Hallucinations:Hallucinations: None  Ideas of Reference:None  Suicidal Thoughts:Suicidal Thoughts: No  Homicidal Thoughts:Homicidal Thoughts: No   Sensorium  Memory: Immediate Fair; Remote Fair; Recent Fair  Judgment: Fair  Insight: Fair   Art therapist  Concentration: Fair  Attention Span: Fair  Recall: [employer] of Knowledge: Fair  Language: Fair   Psychomotor Activity  Psychomotor Activity: Psychomotor Activity: Normal   Assets  Assets: Communication Skills; Desire for Improvement   Sleep  Sleep: Sleep: Poor   Physical Exam: Physical Exam ROS Blood pressure 106/75, pulse (!) 115, temperature 99.5 F (37.5 C), temperature source Oral, resp. rate 18, height 5\' 1"  (1.549 m), weight 115.7 kg, last menstrual period 04/25/2023, SpO2 100 %. Body mass index is 48.18 kg/m.  Mental Status Per Nursing  Assessment::   On Admission:  Suicidal ideation indicated by patient  Demographic Factors:  Divorced or widowed, Caucasian, and Unemployed  Loss Factors: Financial problems/change in socioeconomic status  Historical Factors: Impulsivity  Risk Reduction Factors:   Responsible for children under 8 years of age and Sense of responsibility to family  Continued Clinical Symptoms:  Depression:   Impulsivity  Cognitive Features That Contribute To Risk:  None    Suicide Risk:  Mild:  Suicidal ideation of limited frequency, intensity, duration, and specificity.  There are no identifiable plans, no associated intent, mild dysphoria and related symptoms, good self-control (both objective and subjective assessment), few other risk factors, and identifiable protective factors, including available and accessible social support.   Follow-up Information     [facility]. Go on 05/08/2023.   Why: You have an assessment on 05/08/23 at 9:00 am, to obtain appointments for therapy and medication management services.  The appointment will be held in person.  Please bring photo ID, and insurance card with you. Contact information: [address] [place] [address] [phone]                 Plan Of Care/Follow-up recommendations:  Activity:  AS Rosine Abe, MD 05/06/2023, 8:41 AM

## 2023-05-06 NOTE — Progress Notes (Signed)
  [facility] Adult Case Management Discharge Plan :  Will you be returning to the same living situation after discharge:  Yes,  Patient to return to place of residence.  At discharge, do you have transportation home?: Yes,  CSW to provide patient with transportation voucher to return to residence  Do you have the ability to pay for your medications: Yes,  [place] Medicaid   Release of information consent forms completed and in the chart;  Patient's signature needed at discharge.  Patient to Follow up at:  Follow-up Information     [facility]. Go on 05/08/2023.   Why: You have an assessment on 05/08/23 at 9:00 am, to obtain appointments for therapy and medication management services.  The appointment will be held in person.  Please bring photo ID, and insurance card with you. Contact information: [address] [place] [address] [phone]                 Next level of care provider has access to [facility] Link:no  Safety Planning and Suicide Prevention discussed: Yes,  SPE completed with patient by nursing, declined consent to reach family/friend.    Has patient been referred to the Quitline?: Yes, faxed/e-referral on 05/06/2023 Tobacco Use: High Risk (05/04/2023)   Patient History    Smoking Tobacco Use: Some Days    Smokeless Tobacco Use: Never    Passive Exposure: Not on file   Patient has been referred for addiction treatment: No known substance use disorder. Social History   Substance and Sexual Activity  Alcohol Use Yes   Comment: reports drinking 2 standard drinks 2-3x weekly   Social History   Substance and Sexual Activity  Drug Use Not Currently  Drugs of Abuse     Component Value Date/Time   LABOPIA NONE DETECTED 04/29/2023 1645   COCAINSCRNUR NONE DETECTED 04/29/2023 1645   LABBENZ NONE DETECTED 04/29/2023 1645   AMPHETMU NONE DETECTED 04/29/2023 1645   THCU NONE DETECTED 04/29/2023 1645   LABBARB NONE DETECTED 04/29/2023 1645     Jacqulyn Ducking  [place], Theresia Majors 05/06/2023, 9:38 AM

## 2023-06-01 DIAGNOSIS — Z419 Encounter for procedure for purposes other than remedying health state, unspecified: Secondary | ICD-10-CM | POA: Diagnosis not present

## 2023-07-02 DIAGNOSIS — Z419 Encounter for procedure for purposes other than remedying health state, unspecified: Secondary | ICD-10-CM | POA: Diagnosis not present

## 2023-07-04 ENCOUNTER — Ambulatory Visit
Admission: EM | Admit: 2023-07-04 | Discharge: 2023-07-04 | Disposition: A | Discharge status: Home / Self Care | Payer: Medicaid Other | Source: Home / Self Care

## 2023-07-04 ENCOUNTER — Encounter: Payer: Self-pay | Admitting: Emergency Medicine

## 2023-07-04 DIAGNOSIS — R059 Cough, unspecified: Secondary | ICD-10-CM | POA: Diagnosis not present

## 2023-07-04 DIAGNOSIS — J309 Allergic rhinitis, unspecified: Secondary | ICD-10-CM | POA: Diagnosis not present

## 2023-07-04 MED ORDER — FLUTICASONE PROPIONATE 50 MCG/ACT NA SUSP: 2.0000 | Freq: Every day | NASAL | 0 refills | Status: DC

## 2023-07-04 MED ORDER — PSEUDOEPH-BROMPHEN-DM 30-2-10 MG/5ML PO SYRP: 5.0000 mL | ORAL_SOLUTION | Freq: Four times a day (QID) | ORAL | 0 refills | Status: DC | PRN

## 2023-07-04 MED ORDER — CETIRIZINE HCL 10 MG PO TABS: 10.0000 mg | ORAL_TABLET | Freq: Every day | ORAL | 0 refills | Status: DC

## 2023-07-04 NOTE — ED Triage Notes (Signed)
Productive cough x 4 days, eyes burning.

## 2023-07-04 NOTE — ED Provider Notes (Signed)
[facility]    CSN: [id number] Arrival date & time: 07/04/23  0808      History   Chief Complaint No chief complaint on file.   HPI Debbie Mueller is a 42 y.o. female.   The history is provided by the patient.   The patient presents for a 4-day history of cough.  Patient also complains of nasal congestion, runny nose, and itchy eyes.  Patient states that she has not had fever, chills, headache, sore throat, ear pain, ear drainage, wheezing, shortness of breath, difficulty breathing, chest pain, abdominal pain, nausea, vomiting, or diarrhea.  Patient reports she has been taking over-the-counter cough and cold medicine for her symptoms.  She reports that she does have a history of seasonal allergies, denies history of asthma and smoking.  Patient reports that she has mold in her apartment. Past Medical History:  Diagnosis Date   Acid reflux    Anxiety    Anxiety disorder 2015   Depression    PP depression   Loss of teeth due to gum disease    wears dentures   Overactive bladder    Panic attacks 2015   PTSD (post-traumatic stress disorder) 2015   Scoliosis    Vitamin D deficiency     Patient Active Problem List   Diagnosis Date Noted   Major depressive disorder, recurrent episode, severe (HCC) 04/30/2023   Abdominal pain 07/10/2022   Abscess of left breast 07/10/2022   Vaginal lump 07/10/2022   MDD (major depressive disorder), recurrent severe, without psychosis (HCC) 01/08/2019   Cocaine use disorder, moderate, dependence (HCC) 01/08/2019   Cannabis use disorder, moderate, dependence (HCC) 01/08/2019   Postpartum depression 06/23/2018   History of cesarean delivery 10/28/2017   Tobacco use disorder 10/02/2017    Past Surgical History:  Procedure Laterality Date   CESAREAN SECTION     TUBAL LIGATION Bilateral 04/30/2018   Procedure: POST PARTUM TUBAL LIGATION;  Surgeon: Catalina Antigua, MD;  Location: [facility];  Service: Gynecology;  Laterality:  Bilateral;    OB History     Gravida  3   Para  3   Term  3   Preterm      AB      Living  3      SAB      IAB      Ectopic      Multiple      Live Births  3            Home Medications    Prior to Admission medications   Medication Sig Start Date End Date Taking? Authorizing Provider  brompheniramine-pseudoephedrine-DM 30-2-10 MG/5ML syrup Take 5 mLs by mouth 4 (four) times daily as needed. 07/04/23  Yes Marquan Vokes-Warren, Sadie Haber, NP  cetirizine (ZYRTEC) 10 MG tablet Take 1 tablet (10 mg total) by mouth daily. 07/04/23  Yes Griffin Dewilde-Warren, Sadie Haber, NP  fluticasone (FLONASE) 50 MCG/ACT nasal spray Place 2 sprays into both nostrils daily. 07/04/23  Yes Marquite Attwood-Warren, Sadie Haber, NP  acetaminophen (TYLENOL) 500 MG tablet Take 1,000 mg by mouth every 6 (six) hours as needed for moderate pain or headache.    [provider]  venlafaxine XR (EFFEXOR-XR) 150 MG 24 hr capsule Take 1 capsule (150 mg total) by mouth daily with breakfast. 05/07/23   Rex Kras, MD    Family History Family History  Problem Relation Age of Onset   Diabetes Paternal Grandfather    Hypertension Paternal Grandfather    Diabetes  Paternal Grandmother    Hypertension Paternal Grandmother    Heart attack Maternal Grandmother    Heart attack Maternal Grandfather    Anemia Father    Hypertension Father    Breast cancer Mother    Depression Brother    Other Son        acid reflux    Social History Social History   Tobacco Use   Smoking status: Former    Current packs/day: 0.01    Average packs/day: (0.1 ttl pk-yrs)    Types: Cigarettes   Smokeless tobacco: Never   Tobacco comments:    1x a week  Vaping Use   Vaping status: Never Used  Substance Use Topics   Alcohol use: Yes    Comment: reports drinking 2 standard drinks 2-3x weekly   Drug use: Not Currently     Allergies   Penicillins   Review of Systems Review of Systems Per HPI  Physical Exam Triage Vital  Signs ED Triage Vitals  Encounter Vitals Group     BP 07/04/23 0834 118/74     Systolic BP Percentile --      Diastolic BP Percentile --      Pulse Rate 07/04/23 0834 87     Resp 07/04/23 0834 20     Temp 07/04/23 0834 98.6 F (37 C)     Temp Source 07/04/23 0834 Oral     SpO2 07/04/23 0834 95 %     Weight --      Height --      Head Circumference --      Peak Flow --      Pain Score 07/04/23 0835 0     Pain Loc --      Pain Education --      Exclude from Growth Chart --    No data found.  Updated Vital Signs BP 118/74 (BP Location: Right Arm)   Pulse 87   Temp 98.6 F (37 C) (Oral)   Resp 20   LMP 06/27/2023 (Approximate)   SpO2 95%   Visual Acuity Right Eye Distance:   Left Eye Distance:   Bilateral Distance:    Right Eye Near:   Left Eye Near:    Bilateral Near:     Physical Exam Vitals and nursing note reviewed.  Constitutional:      General: She is not in acute distress.    Appearance: Normal appearance.  HENT:     Head: Normocephalic.     Right Ear: Tympanic membrane, ear canal and external ear normal.     Left Ear: Tympanic membrane, ear canal and external ear normal.     Nose: Congestion present.     Mouth/Throat:     Lips: Pink.     Mouth: Mucous membranes are moist.     Pharynx: Oropharynx is clear. Uvula midline. Posterior oropharyngeal erythema and postnasal drip present. No pharyngeal swelling, oropharyngeal exudate or uvula swelling.     Comments: Cobblestoning present to posterior oropharynx Eyes:     Extraocular Movements: Extraocular movements intact.     Conjunctiva/sclera: Conjunctivae normal.     Pupils: Pupils are equal, round, and reactive to light.  Cardiovascular:     Rate and Rhythm: Normal rate and regular rhythm.     Pulses: Normal pulses.     Heart sounds: Normal heart sounds.  Pulmonary:     Effort: Pulmonary effort is normal. No respiratory distress.     Breath sounds: Normal breath sounds. No stridor. No wheezing,  rhonchi or rales.  Abdominal:     General: Bowel sounds are normal.     Palpations: Abdomen is soft.     Tenderness: There is no abdominal tenderness.  Musculoskeletal:     Cervical back: Normal range of motion.  Lymphadenopathy:     Cervical: No cervical adenopathy.  Skin:    General: Skin is warm and dry.  Neurological:     General: No focal deficit present.     Mental Status: She is alert and oriented to person, place, and time.  Psychiatric:        Mood and Affect: Mood normal.        Behavior: Behavior normal.      UC Treatments / Results  Labs (all labs ordered are listed, but only abnormal results are displayed) Labs Reviewed - No data to display  EKG   Radiology No results found.  Procedures Procedures (including critical care time)  Medications Ordered in UC Medications - No data to display  Initial Impression / Assessment and Plan / UC Course  I have reviewed the triage vital signs and the nursing notes.  Pertinent labs & imaging results that were available during my care of the patient were reviewed by me and considered in my medical decision making (see chart for details).  The patient is well-appearing, she is in no acute distress, vital signs are stable.  Will treat patient for allergic rhinitis.  Difficult to ascertain the cause of the patient's symptoms; however, she does reports she does have mold in her apartment.  Will treat symptomatically with Bromfed-DM for the cough, fluticasone 50 mcg nasal spray for nasal congestion and runny nose, and cetirizine 10 mg for allergy relief.  Supportive care recommendations were provided and discussed with the patient to include over-the-counter analgesics for pain or discomfort, increasing fluids and allowing for plenty of rest, and use of a humidifier in her bedroom at nighttime during sleep.  Patient advised to follow-up with her primary care physician for further evaluation if symptoms fail to improve.  Patient  is in agreement with this plan of care and verbalizes understanding.  All questions were answered.  Patient stable for discharge.   Final Clinical Impressions(s) / UC Diagnoses   Final diagnoses:  Allergic rhinitis, unspecified seasonality, unspecified trigger  Cough, unspecified type     Discharge Instructions      Take medication as prescribed. May take over-the-counter Tylenol as needed for pain, fever, general discomfort. Increase fluids and allow for plenty of rest. Recommend normal saline nasal spray throughout the day to help with nasal congestion and runny nose. For your cough, it would be helpful to sleep elevated on pillows at nighttime and using a humidifier in your bedroom during sleep. If your symptoms fail to improve with this treatment, please follow-up with your primary care physician for further evaluation. Follow-up as needed.     ED Prescriptions     Medication Sig Dispense Auth. Provider   brompheniramine-pseudoephedrine-DM 30-2-10 MG/5ML syrup Take 5 mLs by mouth 4 (four) times daily as needed. 140 mL Ashika Apuzzo-Warren, Sadie Haber, NP   cetirizine (ZYRTEC) 10 MG tablet Take 1 tablet (10 mg total) by mouth daily. 30 tablet Nimesh Riolo-Warren, Sadie Haber, NP   fluticasone (FLONASE) 50 MCG/ACT nasal spray Place 2 sprays into both nostrils daily. 16 g Ginnifer Creelman-Warren, Sadie Haber, NP      PDMP not reviewed this encounter.   Abran Cantor, NP 07/04/23 [id number]

## 2023-07-04 NOTE — Discharge Instructions (Addendum)
Take medication as prescribed. May take over-the-counter Tylenol as needed for pain, fever, general discomfort. Increase fluids and allow for plenty of rest. Recommend normal saline nasal spray throughout the day to help with nasal congestion and runny nose. For your cough, it would be helpful to sleep elevated on pillows at nighttime and using a humidifier in your bedroom during sleep. If your symptoms fail to improve with this treatment, please follow-up with your primary care physician for further evaluation. Follow-up as needed.

## 2023-08-02 DIAGNOSIS — Z419 Encounter for procedure for purposes other than remedying health state, unspecified: Secondary | ICD-10-CM | POA: Diagnosis not present

## 2023-09-01 DIAGNOSIS — Z419 Encounter for procedure for purposes other than remedying health state, unspecified: Secondary | ICD-10-CM | POA: Diagnosis not present

## 2024-02-11 ENCOUNTER — Other Ambulatory Visit: Payer: Self-pay

## 2024-02-11 ENCOUNTER — Ambulatory Visit (HOSPITAL_COMMUNITY)
Admission: EM | Admit: 2024-02-11 | Discharge: 2024-02-11 | Disposition: A | Discharge status: Other Acute Inpatient Hospital | Attending: Nurse Practitioner | Admitting: Nurse Practitioner

## 2024-02-11 ENCOUNTER — Inpatient Hospital Stay (HOSPITAL_COMMUNITY)
Admission: AD | Admit: 2024-02-11 | Discharge: 2024-02-18 | DRG: 885 | Disposition: A | Discharge status: Home / Self Care | Source: Intra-hospital | Attending: Psychiatry | Admitting: Psychiatry

## 2024-02-11 DIAGNOSIS — F17210 Nicotine dependence, cigarettes, uncomplicated: Secondary | ICD-10-CM | POA: Diagnosis present

## 2024-02-11 DIAGNOSIS — Z833 Family history of diabetes mellitus: Secondary | ICD-10-CM

## 2024-02-11 DIAGNOSIS — G4700 Insomnia, unspecified: Secondary | ICD-10-CM | POA: Diagnosis present

## 2024-02-11 DIAGNOSIS — Z91411 Personal history of adult psychological abuse: Secondary | ICD-10-CM

## 2024-02-11 DIAGNOSIS — I10 Essential (primary) hypertension: Secondary | ICD-10-CM | POA: Diagnosis present

## 2024-02-11 DIAGNOSIS — K219 Gastro-esophageal reflux disease without esophagitis: Secondary | ICD-10-CM | POA: Diagnosis present

## 2024-02-11 DIAGNOSIS — Z79899 Other long term (current) drug therapy: Secondary | ICD-10-CM | POA: Diagnosis not present

## 2024-02-11 DIAGNOSIS — R45851 Suicidal ideations: Secondary | ICD-10-CM | POA: Diagnosis present

## 2024-02-11 DIAGNOSIS — F332 Major depressive disorder, recurrent severe without psychotic features: Principal | ICD-10-CM | POA: Diagnosis present

## 2024-02-11 DIAGNOSIS — R632 Polyphagia: Secondary | ICD-10-CM | POA: Diagnosis not present

## 2024-02-11 DIAGNOSIS — F600 Paranoid personality disorder: Secondary | ICD-10-CM | POA: Diagnosis present

## 2024-02-11 DIAGNOSIS — F411 Generalized anxiety disorder: Secondary | ICD-10-CM | POA: Diagnosis present

## 2024-02-11 DIAGNOSIS — Z880 Allergy status to penicillin: Secondary | ICD-10-CM

## 2024-02-11 DIAGNOSIS — Z5900 Homelessness unspecified: Secondary | ICD-10-CM | POA: Diagnosis not present

## 2024-02-11 DIAGNOSIS — Z818 Family history of other mental and behavioral disorders: Secondary | ICD-10-CM | POA: Diagnosis not present

## 2024-02-11 DIAGNOSIS — Z9151 Personal history of suicidal behavior: Secondary | ICD-10-CM

## 2024-02-11 DIAGNOSIS — F4310 Post-traumatic stress disorder, unspecified: Secondary | ICD-10-CM | POA: Insufficient documentation

## 2024-02-11 DIAGNOSIS — Z6841 Body Mass Index (BMI) 40.0 and over, adult: Secondary | ICD-10-CM | POA: Diagnosis not present

## 2024-02-11 DIAGNOSIS — Z803 Family history of malignant neoplasm of breast: Secondary | ICD-10-CM | POA: Diagnosis not present

## 2024-02-11 DIAGNOSIS — Z8249 Family history of ischemic heart disease and other diseases of the circulatory system: Secondary | ICD-10-CM

## 2024-02-11 DIAGNOSIS — Z5948 Other specified lack of adequate food: Secondary | ICD-10-CM

## 2024-02-11 DIAGNOSIS — F17200 Nicotine dependence, unspecified, uncomplicated: Secondary | ICD-10-CM | POA: Diagnosis present

## 2024-02-11 DIAGNOSIS — Z5941 Food insecurity: Secondary | ICD-10-CM

## 2024-02-11 DIAGNOSIS — Z5982 Transportation insecurity: Secondary | ICD-10-CM | POA: Diagnosis not present

## 2024-02-11 DIAGNOSIS — Z62810 Personal history of physical and sexual abuse in childhood: Secondary | ICD-10-CM | POA: Insufficient documentation

## 2024-02-11 DIAGNOSIS — E559 Vitamin D deficiency, unspecified: Secondary | ICD-10-CM | POA: Diagnosis present

## 2024-02-11 DIAGNOSIS — Z91410 Personal history of adult physical and sexual abuse: Secondary | ICD-10-CM

## 2024-02-11 DIAGNOSIS — Z5986 Financial insecurity: Secondary | ICD-10-CM

## 2024-02-11 LAB — CBC WITH DIFFERENTIAL/PLATELET
Abs Immature Granulocytes: 0.05 10*3/uL (ref 0.00–0.07)
Basophils Absolute: 0.1 10*3/uL (ref 0.0–0.1)
Basophils Relative: 1 %
Eosinophils Absolute: 0.1 10*3/uL (ref 0.0–0.5)
Eosinophils Relative: 1 %
HCT: 35.7 % — ABNORMAL LOW (ref 36.0–46.0)
Hemoglobin: 11.5 g/dL — ABNORMAL LOW (ref 12.0–15.0)
Immature Granulocytes: 0 %
Lymphocytes Relative: 25 %
Lymphs Abs: 2.8 10*3/uL (ref 0.7–4.0)
MCH: 26.8 pg (ref 26.0–34.0)
MCHC: 32.2 g/dL (ref 30.0–36.0)
MCV: 83.2 fL (ref 80.0–100.0)
Monocytes Absolute: 0.8 10*3/uL (ref 0.1–1.0)
Monocytes Relative: 7 %
Neutro Abs: 7.5 10*3/uL (ref 1.7–7.7)
Neutrophils Relative %: 66 %
Platelets: 418 10*3/uL — ABNORMAL HIGH (ref 150–400)
RBC: 4.29 MIL/uL (ref 3.87–5.11)
RDW: 15.2 % (ref 11.5–15.5)
WBC: 11.3 10*3/uL — ABNORMAL HIGH (ref 4.0–10.5)
nRBC: 0 % (ref 0.0–0.2)

## 2024-02-11 LAB — URINALYSIS, ROUTINE W REFLEX MICROSCOPIC
Bilirubin Urine: NEGATIVE
Glucose, UA: NEGATIVE mg/dL
Hgb urine dipstick: NEGATIVE
Ketones, ur: NEGATIVE mg/dL
Leukocytes,Ua: NEGATIVE
Nitrite: NEGATIVE
Protein, ur: NEGATIVE mg/dL
Specific Gravity, Urine: 1.028 (ref 1.005–1.030)
pH: 5 (ref 5.0–8.0)

## 2024-02-11 LAB — POCT URINE DRUG SCREEN - MANUAL ENTRY (I-SCREEN)
POC Amphetamine UR: NOT DETECTED
POC Buprenorphine (BUP): NOT DETECTED
POC Cocaine UR: NOT DETECTED
POC Marijuana UR: NOT DETECTED
POC Methadone UR: NOT DETECTED
POC Methamphetamine UR: NOT DETECTED
POC Morphine: NOT DETECTED
POC Oxazepam (BZO): NOT DETECTED
POC Oxycodone UR: NOT DETECTED
POC Secobarbital (BAR): NOT DETECTED

## 2024-02-11 LAB — COMPREHENSIVE METABOLIC PANEL
ALT: 14 U/L (ref 0–44)
AST: 16 U/L (ref 15–41)
Albumin: 3.4 g/dL — ABNORMAL LOW (ref 3.5–5.0)
Alkaline Phosphatase: 71 U/L (ref 38–126)
Anion gap: 11 (ref 5–15)
BUN: 11 mg/dL (ref 6–20)
CO2: 22 mmol/L (ref 22–32)
Calcium: 8.9 mg/dL (ref 8.9–10.3)
Chloride: 105 mmol/L (ref 98–111)
Creatinine, Ser: 0.54 mg/dL (ref 0.44–1.00)
GFR, Estimated: >60 mL/min (ref >60)
Glucose, Bld: 76 mg/dL (ref 70–99)
Potassium: 4.5 mmol/L (ref 3.5–5.1)
Sodium: 138 mmol/L (ref 135–145)
Total Bilirubin: 0.5 mg/dL (ref 0.0–1.2)
Total Protein: 6.3 g/dL — ABNORMAL LOW (ref 6.5–8.1)

## 2024-02-11 LAB — TSH: TSH: 2.518 u[IU]/mL (ref 0.350–4.500)

## 2024-02-11 LAB — VITAMIN D 25 HYDROXY (VIT D DEFICIENCY, FRACTURES): Vit D, 25-Hydroxy: 9.38 ng/mL — ABNORMAL LOW (ref 30–100)

## 2024-02-11 LAB — VITAMIN B12: Vitamin B-12: 144 pg/mL — ABNORMAL LOW (ref 180–914)

## 2024-02-11 LAB — LIPID PANEL
Cholesterol: 172 mg/dL (ref 0–200)
HDL: 37 mg/dL — ABNORMAL LOW (ref >40)
LDL Cholesterol: 94 mg/dL (ref 0–99)
Total CHOL/HDL Ratio: 4.6 ratio
Triglycerides: 204 mg/dL — ABNORMAL HIGH (ref <150)
VLDL: 41 mg/dL — ABNORMAL HIGH (ref 0–40)

## 2024-02-11 LAB — POC URINE PREG, ED: Preg Test, Ur: NEGATIVE

## 2024-02-11 LAB — HEMOGLOBIN A1C
Hgb A1c MFr Bld: 5.3 % (ref 4.8–5.6)
Mean Plasma Glucose: 105.41 mg/dL

## 2024-02-11 LAB — ETHANOL: Alcohol, Ethyl (B): <10 mg/dL (ref <10)

## 2024-02-11 MED ORDER — ALUM & MAG HYDROXIDE-SIMETH 200-200-20 MG/5ML PO SUSP: 30.0000 mL | ORAL | Status: DC | PRN

## 2024-02-11 MED ORDER — OLANZAPINE 5 MG PO TBDP: 5.0000 mg | ORAL_TABLET | Freq: Three times a day (TID) | ORAL | Status: DC | PRN

## 2024-02-11 MED ORDER — TRAZODONE HCL 50 MG PO TABS: 50.0000 mg | ORAL_TABLET | Freq: Every evening | ORAL | Status: DC | PRN

## 2024-02-11 MED ORDER — DIPHENHYDRAMINE HCL 50 MG/ML IJ SOLN: 50.0000 mg | Freq: Three times a day (TID) | INTRAMUSCULAR | Status: DC | PRN

## 2024-02-11 MED ORDER — HALOPERIDOL LACTATE 5 MG/ML IJ SOLN: 10.0000 mg | Freq: Three times a day (TID) | INTRAMUSCULAR | Status: DC | PRN

## 2024-02-11 MED ORDER — MAGNESIUM HYDROXIDE 400 MG/5ML PO SUSP: 30.0000 mL | Freq: Every day | ORAL | Status: DC | PRN

## 2024-02-11 MED ORDER — LORAZEPAM 2 MG/ML IJ SOLN: 2.0000 mg | Freq: Three times a day (TID) | INTRAMUSCULAR | Status: DC | PRN

## 2024-02-11 MED ORDER — HYDROXYZINE HCL 25 MG PO TABS
25.0000 mg | ORAL_TABLET | Freq: Three times a day (TID) | ORAL | Status: DC | PRN
  Administered 2024-02-11: 25 mg via ORAL
  Filled 2024-02-11: qty 1

## 2024-02-11 MED ORDER — ACETAMINOPHEN 325 MG PO TABS: 650.0000 mg | ORAL_TABLET | Freq: Four times a day (QID) | ORAL | Status: DC | PRN

## 2024-02-11 MED ORDER — HALOPERIDOL LACTATE 5 MG/ML IJ SOLN: 5.0000 mg | Freq: Three times a day (TID) | INTRAMUSCULAR | Status: DC | PRN

## 2024-02-11 NOTE — ED Notes (Signed)
 Pt eating dinner now. States she is feeling less anxious. She has been interacting with other patients. No s/sx of distress. No further concerns at this time.

## 2024-02-11 NOTE — ED Notes (Signed)
 Report given to Garnet RN@[facility]  adult unit
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
## 2024-02-11 NOTE — ED Notes (Signed)
 Pt just signed voluntary admission form via e-consent. This RN explained the process of a voluntary admit. All questions answered.
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
## 2024-02-11 NOTE — ED Notes (Signed)
 RN attempted to call report to [facility]. No answer. Will inform nightshift.

## 2024-02-11 NOTE — ED Notes (Signed)
 Pt feeling a little anxious, asking for something to help- Rn giving prn PO atarax

## 2024-02-11 NOTE — ED Notes (Signed)
 Pt resting@this  time calm and cooperative no pain or distress noted denies SI/HI/AVH at this time she is worried about her daughter whom is with her parents and herself being homeless will continue to monitor for safety

## 2024-02-11 NOTE — ED Notes (Signed)
 just did skin check, she has 2 lesions on her L breast; states they have intermittently been there for about a year now. they come and go for months at a time. states they sometimes produce a brown colored discharge. reports multiple family members have a hx of breast cancer. she's very concerned about them and asking for a provier to eval them. RN just informed NP Dorris of this.

## 2024-02-11 NOTE — ED Notes (Signed)
 Pt sleeping@this  time breathing even and unlabored will continue to monitor for saftey

## 2024-02-11 NOTE — ED Notes (Addendum)
 Pt arrived over to [facility] bed 3. She is tearful upon arrival, but very pleasant. She reports increasing depression and passive SI, no plan and no intent. She does report a previous suicide attempt a couple years ago, attempted to OD. She is currently homeless. Reports her family will not let them stay with her, and they are keeping her daughter away from her as well. These are her main stressors. She denies any HI or AVH. States she believes she has HTN but is not on medicine for it. She was oriented to the unit, all questions were answered. There are no further concerns at this time.
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
## 2024-02-12 ENCOUNTER — Encounter (HOSPITAL_COMMUNITY): Payer: Self-pay

## 2024-02-12 ENCOUNTER — Encounter (HOSPITAL_COMMUNITY): Payer: Self-pay | Admitting: Psychiatry

## 2024-02-12 DIAGNOSIS — F4310 Post-traumatic stress disorder, unspecified: Secondary | ICD-10-CM | POA: Insufficient documentation

## 2024-02-12 DIAGNOSIS — F332 Major depressive disorder, recurrent severe without psychotic features: Secondary | ICD-10-CM | POA: Diagnosis not present

## 2024-02-12 DIAGNOSIS — F411 Generalized anxiety disorder: Secondary | ICD-10-CM | POA: Insufficient documentation

## 2024-02-12 MED ORDER — LORATADINE 10 MG PO TABS
10.0000 mg | ORAL_TABLET | Freq: Every day | ORAL | Status: DC
  Administered 2024-02-12 – 2024-02-18 (×7): 10 mg via ORAL
  Filled 2024-02-12 (×11): qty 1

## 2024-02-12 MED ORDER — HALOPERIDOL LACTATE 5 MG/ML IJ SOLN: 5.0000 mg | Freq: Three times a day (TID) | INTRAMUSCULAR | Status: DC | PRN

## 2024-02-12 MED ORDER — VITAMIN D3 25 MCG PO TABS
1000.0000 [IU] | ORAL_TABLET | Freq: Every day | ORAL | Status: DC
  Administered 2024-02-12 – 2024-02-16 (×5): 1000 [IU] via ORAL
  Filled 2024-02-12 (×7): qty 1

## 2024-02-12 MED ORDER — MAGNESIUM HYDROXIDE 400 MG/5ML PO SUSP: 30.0000 mL | Freq: Every day | ORAL | Status: DC | PRN

## 2024-02-12 MED ORDER — LORAZEPAM 2 MG/ML IJ SOLN: 2.0000 mg | Freq: Three times a day (TID) | INTRAMUSCULAR | Status: DC | PRN

## 2024-02-12 MED ORDER — HYDROXYZINE HCL 25 MG PO TABS
25.0000 mg | ORAL_TABLET | Freq: Three times a day (TID) | ORAL | Status: DC | PRN
  Administered 2024-02-12 – 2024-02-14 (×4): 25 mg via ORAL
  Filled 2024-02-12 (×4): qty 1

## 2024-02-12 MED ORDER — HALOPERIDOL LACTATE 5 MG/ML IJ SOLN: 10.0000 mg | Freq: Three times a day (TID) | INTRAMUSCULAR | Status: DC | PRN

## 2024-02-12 MED ORDER — ACETAMINOPHEN 325 MG PO TABS: 650.0000 mg | ORAL_TABLET | Freq: Four times a day (QID) | ORAL | Status: DC | PRN

## 2024-02-12 MED ORDER — PRAZOSIN HCL 1 MG PO CAPS
1.0000 mg | ORAL_CAPSULE | Freq: Every day | ORAL | Status: DC
  Administered 2024-02-12 – 2024-02-17 (×6): 1 mg via ORAL
  Filled 2024-02-12 (×10): qty 1

## 2024-02-12 MED ORDER — DIPHENHYDRAMINE HCL 50 MG/ML IJ SOLN: 50.0000 mg | Freq: Three times a day (TID) | INTRAMUSCULAR | Status: DC | PRN

## 2024-02-12 MED ORDER — ALUM & MAG HYDROXIDE-SIMETH 200-200-20 MG/5ML PO SUSP
30.0000 mL | ORAL | Status: DC | PRN
Start: 2024-02-12 — End: 2024-02-18

## 2024-02-12 MED ORDER — QUETIAPINE FUMARATE 25 MG PO TABS
25.0000 mg | ORAL_TABLET | Freq: Every day | ORAL | Status: DC
  Administered 2024-02-12 – 2024-02-13 (×2): 25 mg via ORAL
  Filled 2024-02-12 (×5): qty 1

## 2024-02-12 MED ORDER — TRAZODONE HCL 50 MG PO TABS
50.0000 mg | ORAL_TABLET | Freq: Every evening | ORAL | Status: DC | PRN
  Administered 2024-02-12: 50 mg via ORAL
  Filled 2024-02-12: qty 1

## 2024-02-12 MED ORDER — ALUM & MAG HYDROXIDE-SIMETH 200-200-20 MG/5ML PO SUSP: 30.0000 mL | ORAL | Status: DC | PRN

## 2024-02-12 MED ORDER — MELATONIN 5 MG PO TABS
5.0000 mg | ORAL_TABLET | Freq: Every evening | ORAL | Status: DC | PRN
Start: 2024-02-12 — End: 2024-02-13

## 2024-02-12 MED ORDER — FERROUS SULFATE 325 (65 FE) MG PO TABS
325.0000 mg | ORAL_TABLET | Freq: Every day | ORAL | Status: DC
  Administered 2024-02-13 – 2024-02-18 (×6): 325 mg via ORAL
  Filled 2024-02-12 (×8): qty 1

## 2024-02-12 MED ORDER — VENLAFAXINE HCL ER 37.5 MG PO CP24
37.5000 mg | ORAL_CAPSULE | Freq: Every day | ORAL | Status: DC
  Administered 2024-02-13: 37.5 mg via ORAL
  Filled 2024-02-12 (×3): qty 1

## 2024-02-12 MED ORDER — PANTOPRAZOLE SODIUM 40 MG PO TBEC
40.0000 mg | DELAYED_RELEASE_TABLET | Freq: Every day | ORAL | Status: DC
Start: 2024-02-13 — End: 2024-02-18
  Administered 2024-02-13 – 2024-02-18 (×6): 40 mg via ORAL
  Filled 2024-02-12 (×8): qty 1

## 2024-02-12 MED ORDER — OLANZAPINE 5 MG PO TBDP: 5.0000 mg | ORAL_TABLET | Freq: Three times a day (TID) | ORAL | Status: DC | PRN

## 2024-02-12 MED ORDER — NICOTINE 14 MG/24HR TD PT24
14.0000 mg | MEDICATED_PATCH | Freq: Every day | TRANSDERMAL | Status: DC
  Administered 2024-02-12 – 2024-02-18 (×7): 14 mg via TRANSDERMAL
  Filled 2024-02-12 (×10): qty 1

## 2024-02-12 MED ORDER — ACETAMINOPHEN 325 MG PO TABS
650.0000 mg | ORAL_TABLET | Freq: Four times a day (QID) | ORAL | Status: DC | PRN
  Administered 2024-02-12 – 2024-02-18 (×2): 650 mg via ORAL
  Filled 2024-02-12 (×2): qty 2

## 2024-02-12 MED ORDER — MELATONIN 3 MG PO TABS
3.0000 mg | ORAL_TABLET | Freq: Every day | ORAL | Status: DC
  Administered 2024-02-12: 3 mg via ORAL
  Filled 2024-02-12 (×4): qty 1

## 2024-02-12 NOTE — H&P (Signed)
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 admitted to the psychiatric unit. -- We will address other chronic and acute stressors, which contributed to the patient's MDD (major depressive disorder), recurrent severe, without psychosis (HCC) in order to reduce the risk of self-harm at discharge.  5. Discharge Planning:   -- Social work and case management to assist with discharge planning and identification of hospital follow-up needs prior to discharge  -- Estimated LOS: 5-7 days  -- Discharge Concerns: Need to establish a safety plan; Medication compliance and effectiveness  -- Discharge Goals: Return home with outpatient  referrals for mental health follow-up including medication management/psychotherapy  I certify that inpatient services furnished can reasonably be expected to improve the patient's condition.   Signed: Peterson Ao, MD 02/12/2024, 4:47 PM
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
## 2024-02-12 NOTE — Progress Notes (Signed)
   02/12/24 0000  Psych Admission Type (Psych Patients Only)  Admission Status Voluntary  Psychosocial Assessment  Patient Complaints Anxiety;Depression;Insomnia;Crying spells;Sadness;Worrying  Eye Contact Fair  Facial Expression Anxious;Sad  Affect Depressed;Sad  Speech Logical/coherent  Interaction Assertive  Motor Activity Slow  Appearance/Hygiene Unremarkable;In scrubs  Behavior Characteristics Cooperative;Appropriate to situation;Anxious  Mood Anxious;Depressed;Sad;Pleasant  Thought Process  Coherency WDL  Content WDL  Delusions None reported or observed  Perception WDL  Hallucination None reported or observed  Judgment WDL  Confusion None  Danger to Self  Current suicidal ideation? Denies (denies)  Agreement Not to Harm Self Yes  Description of Agreement verbal  Danger to Others  Danger to Others None reported or observed

## 2024-02-12 NOTE — Group Note (Signed)
 Date:  02/12/2024 Time:  1:15 PM  Group Topic/Focus:  Managing Feelings:   The focus of this group is to identify what feelings patients have difficulty handling and develop a plan to handle them in a healthier way upon discharge. Primary and Secondary Emotions:   The focus of this group is to discuss the difference between primary and secondary emotions.    Participation Level:  Active  Participation Quality:  Appropriate  Affect:  Appropriate  Cognitive:  Appropriate  Insight: Appropriate  Engagement in Group:  Engaged  Modes of Intervention:  Activity and Education  Additional Comments:   Pt attended and actively participated in the Wellness group.  Debbie Mueller 02/12/2024, 1:15 PM

## 2024-02-12 NOTE — Group Note (Signed)
 Date:  02/12/2024 Time:  10:34 PM  Group Topic/Focus:  AA    Participation Level:  Did Not Attend   Scot Dock 02/12/2024, 10:34 PM

## 2024-02-12 NOTE — Plan of Care (Signed)
  Problem: Education: Goal: Emotional status will improve Outcome: Not Progressing Goal: Mental status will improve Outcome: Not Progressing   Problem: Education: Goal: Mental status will improve Outcome: Not Progressing
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
## 2024-02-12 NOTE — BH IP Treatment Plan (Signed)
 Interdisciplinary Treatment and Diagnostic Plan Update  02/12/2024 Time of Session: 11:10 AM Debbie Mueller MRN: [id number]  Principal Diagnosis: MDD (major depressive disorder), recurrent severe, without psychosis (HCC)  Secondary Diagnoses: Principal Problem:   MDD (major depressive disorder), recurrent severe, without psychosis (HCC)   Current Medications:  Current Facility-Administered Medications  Medication Dose Route Frequency Provider Last Rate Last Admin   acetaminophen (TYLENOL) tablet 650 mg  650 mg Oral Q6H PRN Starleen Blue, NP   650 mg at 02/12/24 0041   alum & mag hydroxide-simeth (MAALOX/MYLANTA) 200-200-20 MG/5ML suspension 30 mL  30 mL Oral Q4H PRN Starleen Blue, NP       haloperidol lactate (HALDOL) injection 10 mg  10 mg Intramuscular TID PRN Starleen Blue, NP       And   diphenhydrAMINE (BENADRYL) injection 50 mg  50 mg Intramuscular TID PRN Starleen Blue, NP       And   LORazepam (ATIVAN) injection 2 mg  2 mg Intramuscular TID PRN Starleen Blue, NP       haloperidol lactate (HALDOL) injection 5 mg  5 mg Intramuscular TID PRN Starleen Blue, NP       And   diphenhydrAMINE (BENADRYL) injection 50 mg  50 mg Intramuscular TID PRN Starleen Blue, NP       And   LORazepam (ATIVAN) injection 2 mg  2 mg Intramuscular TID PRN Starleen Blue, NP       hydrOXYzine (ATARAX) tablet 25 mg  25 mg Oral TID PRN Starleen Blue, NP   25 mg at 02/12/24 0042   magnesium hydroxide (MILK OF MAGNESIA) suspension 30 mL  30 mL Oral Daily PRN Starleen Blue, NP       OLANZapine zydis (ZYPREXA) disintegrating tablet 5 mg  5 mg Oral TID PRN Starleen Blue, NP       traZODone (DESYREL) tablet 50 mg  50 mg Oral QHS PRN Starleen Blue, NP   50 mg at 02/12/24 0041   PTA Medications: Medications Prior to Admission  Medication Sig Dispense Refill Last Dose/Taking   acetaminophen (TYLENOL) 500 MG tablet Take 1,000 mg by mouth every 6 (six) hours as needed for moderate pain or headache.       cetirizine (ZYRTEC) 10 MG tablet Take 1 tablet (10 mg total) by mouth daily. (Patient not taking: Reported on 02/11/2024) 30 tablet 0    cholecalciferol (VITAMIN D3) 25 MCG (1000 UNIT) tablet Take 1,000 Units by mouth daily.      ferrous sulfate 325 (65 FE) MG tablet Take 325 mg by mouth daily with breakfast.      fluticasone (FLONASE) 50 MCG/ACT nasal spray Place 2 sprays into both nostrils daily. (Patient not taking: Reported on 02/11/2024) 16 g 0    venlafaxine XR (EFFEXOR-XR) 150 MG 24 hr capsule Take 1 capsule (150 mg total) by mouth daily with breakfast. (Patient not taking: Reported on 02/11/2024) 30 capsule 0     Patient Stressors: Financial difficulties   Loss of pt lost custody of kids last year; evicted recently   Marital or family conflict    Patient Strengths: Average or above average intelligence  Capable of independent living  Motivation for treatment/growth   Treatment Modalities: Medication Management, Group therapy, Case management,  1 to 1 session with clinician, Psychoeducation, Recreational therapy.   Physician Treatment Plan for Primary Diagnosis: MDD (major depressive disorder), recurrent severe, without psychosis (HCC) Long Term Goal(s):     Short Term Goals:    Medication Management: Evaluate patient's response,  side effects, and tolerance of medication regimen.  Therapeutic Interventions: 1 to 1 sessions, Unit Group sessions and Medication administration.  Evaluation of Outcomes: Not Progressing  Physician Treatment Plan for Secondary Diagnosis: Principal Problem:   MDD (major depressive disorder), recurrent severe, without psychosis (HCC)  Long Term Goal(s):     Short Term Goals:       Medication Management: Evaluate patient's response, side effects, and tolerance of medication regimen.  Therapeutic Interventions: 1 to 1 sessions, Unit Group sessions and Medication administration.  Evaluation of Outcomes: Not Progressing   RN Treatment Plan for  Primary Diagnosis: MDD (major depressive disorder), recurrent severe, without psychosis (HCC) Long Term Goal(s): Knowledge of disease and therapeutic regimen to maintain health will improve  Short Term Goals: Ability to remain free from injury will improve, Ability to verbalize frustration and anger appropriately will improve, Ability to demonstrate self-control, Ability to participate in decision making will improve, Ability to verbalize feelings will improve, Ability to disclose and discuss suicidal ideas, Ability to identify and develop effective coping behaviors will improve, and Compliance with prescribed medications will improve  Medication Management: RN will administer medications as ordered by provider, will assess and evaluate patient's response and provide education to patient for prescribed medication. RN will report any adverse and/or side effects to prescribing provider.  Therapeutic Interventions: 1 on 1 counseling sessions, Psychoeducation, Medication administration, Evaluate responses to treatment, Monitor vital signs and CBGs as ordered, Perform/monitor CIWA, COWS, AIMS and Fall Risk screenings as ordered, Perform wound care treatments as ordered.  Evaluation of Outcomes: Not Progressing   LCSW Treatment Plan for Primary Diagnosis: MDD (major depressive disorder), recurrent severe, without psychosis (HCC) Long Term Goal(s): Safe transition to appropriate next level of care at discharge, Engage patient in therapeutic group addressing interpersonal concerns.  Short Term Goals: Engage patient in aftercare planning with referrals and resources, Increase social support, Increase ability to appropriately verbalize feelings, Increase emotional regulation, Facilitate acceptance of mental health diagnosis and concerns, Facilitate patient progression through stages of change regarding substance use diagnoses and concerns, Identify triggers associated with mental health/substance abuse issues,  and Increase skills for wellness and recovery  Therapeutic Interventions: Assess for all discharge needs, 1 to 1 time with Social worker, Explore available resources and support systems, Assess for adequacy in community support network, Educate family and significant other(s) on suicide prevention, Complete Psychosocial Assessment, Interpersonal group therapy.  Evaluation of Outcomes: Not Progressing   Progress in Treatment: Attending groups: No. Participating in groups: No. Taking medication as prescribed: Yes. Toleration medication: Yes. Family/Significant other contact made: No, consents are pending Patient understands diagnosis: Yes. Discussing patient identified problems/goals with staff: Yes. Medical problems stabilized or resolved: Yes. Denies suicidal/homicidal ideation: Yes. Issues/concerns per patient self-inventory: No.  New problem(s) identified:  No  New Short Term/Long Term Goal(s):   medication stabilization, elimination of SI thoughts, development of comprehensive mental wellness plan.   Patient Goals:  "I want to get help with trauma, anxiety and depression."  Patient's main stressors are homelessness and the loss of custody of her children.  Patient lost her children due to financial difficulties.  Discharge Plan or Barriers:  Patient recently admitted. CSW will continue to follow and assess for appropriate referrals and possible discharge planning.   Reason for Continuation of Hospitalization: Anxiety Depression Medication stabilization  Estimated Length of Stay:  5 - 7 days  Last 3 [place] Suicide Severity Risk Score: Flowsheet Row [facility] (Current[facility]  Most recent reading at 02/12/2024 12:00 AM ED from 02/11/2024 in [facility] Most recent reading at 02/11/2024  3:29 PM ED from 07/04/2023 in [facility] [place] Most recent reading at 07/04/2023  8:34 AM   C-SSRS RISK CATEGORY No Risk Moderate Risk No Risk       Last PHQ 2/9 Scores:    09/24/2022    8:13 AM 07/09/2022   10:31 AM 10/28/2017   10:39 AM  Depression screen PHQ 2/9  Decreased Interest 1 0 1  Down, Depressed, Hopeless 0 0 1  PHQ - 2 Score 1 0 2  Altered sleeping   0  Tired, decreased energy   1  Change in appetite   0  Feeling bad or failure about yourself    1  Trouble concentrating   0  Moving slowly or fidgety/restless   0  Suicidal thoughts   0  PHQ-9 Score   4  Difficult doing work/chores   Somewhat difficult    Scribe for Treatment Team: Nyra Jabs 02/12/2024 1:43 PM

## 2024-02-12 NOTE — Progress Notes (Signed)
   02/12/24 1000  Psych Admission Type (Psych Patients Only)  Admission Status Voluntary  Psychosocial Assessment  Patient Complaints Anxiety;Depression  Eye Contact Fair  Facial Expression Animated  Affect Anxious  Speech Logical/coherent  Interaction Assertive  Motor Activity Slow  Appearance/Hygiene Unremarkable;In scrubs  Behavior Characteristics Cooperative;Appropriate to situation  Mood Anxious;Pleasant  Thought Process  Coherency WDL  Content WDL  Delusions None reported or observed  Perception WDL  Hallucination None reported or observed  Judgment WDL  Confusion None  Danger to Self  Current suicidal ideation? Denies  Danger to Others  Danger to Others None reported or observed   Pt went down to breakfast this morning. No change in demeanor from last night. Pt slept well. Pt went back to sleep after breakfast. Is still sleeping soundly. No issues to report.

## 2024-02-12 NOTE — Group Note (Signed)
 Date:  02/12/2024 Time:  1:37 PM  Group Topic/Focus:  Managing Feelings:   The focus of this group is to identify what feelings patients have difficulty handling and develop a plan to handle them in a healthier way upon discharge.    Participation Level:  Did Not Attend  Participation Quality:   n/a  Affect:  n/a  Cognitive:   n/a  Insight: None  Engagement in Group:   n/a  Modes of Intervention:   n/a  Additional Comments:   Did not attend  Debbie Mueller 02/12/2024, 1:37 PM

## 2024-02-12 NOTE — Tx Team (Signed)
 Initial Treatment Plan 02/12/2024 1:06 AM Debbie Mueller [id number]    PATIENT STRESSORS: Financial difficulties   Loss of pt lost custody of kids last year; evicted recently   Marital or family conflict     PATIENT STRENGTHS: Average or above average intelligence  Capable of independent living  Motivation for treatment/growth    PATIENT IDENTIFIED PROBLEMS: Anxiety  Depression  Panic attack (2 days ago)  Homelessness  Insomnia  ("Pt wants housing assistance, wants to get better mentally and wants a payee to help her with her finances.")           DISCHARGE CRITERIA:  Ability to meet basic life and health needs Adequate post-discharge living arrangements Improved stabilization in mood, thinking, and/or behavior Motivation to continue treatment in a less acute level of care  PRELIMINARY DISCHARGE PLAN: Outpatient therapy Placement in alternative living arrangements  PATIENT/FAMILY INVOLVEMENT: This treatment plan has been presented to and reviewed with the patient, Debbie Mueller, and/or family member.  The patient and family have been given the opportunity to ask questions and make suggestions.  Victorino December, RN 02/12/2024, 1:06 AM

## 2024-02-12 NOTE — Group Note (Signed)
 Recreation Therapy Group Note   Group Topic:Team Building  Group Date: 02/12/2024 Start Time: 0930 End Time: 1000 Facilitators: Debbie Mueller, LRT,CTRS Location: [address] Dayroom   Group Topic: Communication, Team Building, Problem Solving  Goal Area(s) Addresses:  Patient will effectively work with peer towards shared goal.  Patient will identify skills used to make activity successful.  Patient will identify how skills used during activity can be applied to reach post d/c goals.   Intervention: STEM Activity- Glass blower/designer  Activity: Tallest [employer]. In teams of 5-6, patients were given 11 craft pipe cleaners. Using the materials provided, patients were instructed to compete again the opposing team(s) to build the tallest free-standing structure from floor level. The activity was timed; difficulty increased by Clinical research associate as Production designer, theatre/television/film continued.  Systematically resources were removed with additional directions for example, placing one arm behind their back, working in silence, and shape stipulations. LRT facilitated post-activity discussion reviewing team processes and necessary communication skills involved in completion. Patients were encouraged to reflect how the skills utilized, or not utilized, in this activity can be incorporated to positively impact support systems post discharge.  Education: Pharmacist, community, Scientist, physiological, Discharge Planning   Education Outcome: Acknowledges education/In group clarification offered/Needs additional education.   Affect/Mood: N/A   Participation Level: Did not attend    Clinical Observations/Individualized Feedback:     Plan: Continue to engage patient in RT group sessions 2-3x/week.   Debbie Mueller, LRT,CTRS 02/12/2024 11:31 AM

## 2024-02-12 NOTE — Plan of Care (Signed)
  Problem: Education: Goal: Emotional status will improve Outcome: Progressing   Problem: Activity: Goal: Interest or engagement in activities will improve Outcome: Progressing Goal: Sleeping patterns will improve Outcome: Progressing   Problem: Coping: Goal: Ability to verbalize frustrations and anger appropriately will improve Outcome: Progressing Goal: Ability to demonstrate self-control will improve Outcome: Progressing   Problem: Safety: Goal: Periods of time without injury will increase Outcome: Progressing

## 2024-02-12 NOTE — Progress Notes (Signed)
   02/12/24 0558  15 Minute Checks  Location Bedroom  Visual Appearance Calm  Behavior Sleeping  Sleep (Behavioral Health Patients Only)  Calculate sleep? (Click Yes once per 24 hr at 0600 safety check) Yes  Documented sleep last 24 hours 5.25

## 2024-02-12 NOTE — Group Note (Signed)
 Date:  02/12/2024 Time:  10:48 AM  Group Topic/Focus:  Goals Group:   The focus of this group is to help patients establish daily goals to achieve during treatment and discuss how the patient can incorporate goal setting into their daily lives to aide in recovery. Orientation:   The focus of this group is to educate the patient on the purpose and policies of crisis stabilization and provide a format to answer questions about their admission.  The group details unit policies and expectations of patients while admitted.    Participation Level:  Did Not Attend  Participation Quality:   n/a  Affect:   n/a  Cognitive:   n/a  Insight: None  Engagement in Group:   n/a  Modes of Intervention:   n/a  Additional Comments:   Did not attend  Stark Bray 02/12/2024, 10:48 AM

## 2024-02-12 NOTE — Plan of Care (Signed)
 Pt has been admitted for 4 hours.  Problem: Education: Goal: Mental status will improve Outcome: Progressing   Problem: Activity: Goal: Sleeping patterns will improve Outcome: Progressing

## 2024-02-12 NOTE — Progress Notes (Signed)
   02/12/24 2135  Psych Admission Type (Psych Patients Only)  Admission Status Voluntary  Psychosocial Assessment  Patient Complaints Anxiety;Depression  Eye Contact Fair  Facial Expression Animated  Affect Anxious  Speech Logical/coherent  Interaction Assertive  Motor Activity Slow  Appearance/Hygiene Unremarkable  Behavior Characteristics Cooperative;Appropriate to situation  Mood Anxious;Depressed;Pleasant  Thought Process  Coherency WDL  Content WDL  Delusions None reported or observed  Perception WDL  Hallucination None reported or observed  Judgment WDL  Confusion None  Danger to Self  Current suicidal ideation? Denies  Danger to Others  Danger to Others None reported or observed

## 2024-02-13 DIAGNOSIS — F332 Major depressive disorder, recurrent severe without psychotic features: Secondary | ICD-10-CM | POA: Diagnosis not present

## 2024-02-13 MED ORDER — MELATONIN 5 MG PO TABS
5.0000 mg | ORAL_TABLET | Freq: Every day | ORAL | Status: DC
  Administered 2024-02-13 – 2024-02-17 (×5): 5 mg via ORAL
  Filled 2024-02-13 (×8): qty 1

## 2024-02-13 MED ORDER — VENLAFAXINE HCL ER 75 MG PO CP24
75.0000 mg | ORAL_CAPSULE | Freq: Every day | ORAL | Status: DC
  Administered 2024-02-14 – 2024-02-16 (×3): 75 mg via ORAL
  Filled 2024-02-13 (×5): qty 1

## 2024-02-13 NOTE — Group Note (Signed)
 Date:  02/13/2024 Time:  9:43 AM  Group Topic/Focus:  Coping With Mental Health Crisis:   The purpose of this group is to help patients identify strategies for coping with mental health crisis.  Group discusses possible causes of crisis and ways to manage them effectively. Goals Group:   The focus of this group is to help patients establish daily goals to achieve during treatment and discuss how the patient can incorporate goal setting into their daily lives to aide in recovery. Orientation:   The focus of this group is to educate the patient on the purpose and policies of crisis stabilization and provide a format to answer questions about their admission.  The group details unit policies and expectations of patients while admitted.    Participation Level:  Active  Participation Quality:  Appropriate  Affect:  Appropriate  Cognitive:  Appropriate  Insight: Appropriate  Engagement in Group:  Engaged  Modes of Intervention:  Discussion, Orientation, and Socialization  Additional Comments:  goall is to stay positive  Debbie Mueller 02/13/2024, 9:43 AM

## 2024-02-13 NOTE — Progress Notes (Signed)
  On assessment, patient presents with anxiety.  Administered PRN Hydroxyzine per [facility] per patient request.

## 2024-02-13 NOTE — Plan of Care (Signed)
  Problem: Education: Goal: Knowledge of [facility] Education information/materials will improve Outcome: Progressing   Problem: Activity: Goal: Interest or engagement in activities will improve Outcome: Progressing   

## 2024-02-13 NOTE — Group Note (Signed)
 Date:  02/13/2024 Time:  4:27 PM  Group Topic/Focus:  Building Self Esteem:   The Focus of this group is helping patients become aware of the effects of self-esteem on their lives, the things they and others do that enhance or undermine their self-esteem, seeing the relationship between their level of self-esteem and the choices they make and learning ways to enhance self-esteem.    Participation Level:  Did Not Attend  Participation Quality:   n/a  Affect:   n/a  Cognitive:   n/a  Insight: None  Engagement in Group:   n/a  Modes of Intervention:   n/a  Additional Comments:    Azalee Course 02/13/2024, 4:27 PM
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
## 2024-02-13 NOTE — Progress Notes (Addendum)
 D. Pt presents with a sad affect/ anxious, depressed mood- rated her depression, hopelessness and anxiety a 5/5/8, respectively. Pt's stated goals today were "to control stress, anger, things I can not control. Stay calm, be positive, take drug classes to get children back, find housing and shelter because I'm homeless." Pt has been visible in the milieu, observed attending groups.  Pt currently denies SI/HI and AVH A. Labs and vitals monitored. Pt given and educated on medications. Pt supported emotionally and encouraged to express concerns and ask questions.   R. Pt remains safe with 15 minute checks. Will continue POC.    02/13/24 0900  Psych Admission Type (Psych Patients Only)  Admission Status Voluntary  Psychosocial Assessment  Patient Complaints Anxiety  Eye Contact Fair  Facial Expression Animated  Affect Appropriate to circumstance  Speech Logical/coherent  Interaction Assertive  Motor Activity Slow  Appearance/Hygiene Unremarkable  Behavior Characteristics Cooperative;Calm  Mood Anxious;Pleasant  Thought Process  Coherency WDL  Content WDL  Delusions None reported or observed  Perception WDL  Hallucination None reported or observed  Judgment WDL  Confusion None  Danger to Self  Current suicidal ideation? Denies  Danger to Others  Danger to Others None reported or observed

## 2024-02-13 NOTE — Group Note (Signed)
  [facility] LCSW Group Therapy Note    Type of Therapy and Topic:  Group Therapy:  Identifying Triggers and Coping Mechanisms  Participation Level:  Active   Description of Group This process group involved patients discussing what how to identify triggers and what coping skills they can use.  These necessary actions were discussed in detail, including why they are important and methods to be used to ensure they are continued.  The group brainstormed together other possible coping skills that would benefit patients by being continued after discharge.  Therapeutic Goals Patients will identify and describe behavioral triggers and coping mechanisms. Patients will verbalize benefits of these actions Patients will describe specifically how they plan to keep these habits active Patients will brainstorm other necessary actions that can produce positive benefits in the outpatient setting  Summary of Patient Progress:  The patient expressed one thing that can continue after hospital discharge that will help her stay well is listening to music.  The way in which this can be continued is daily practice.    Therapeutic Modalities Cognitive Behavioral Therapy Motivational Interviewing   Azucena Kuba, MSW, LCSWA

## 2024-02-13 NOTE — [facility] Group Notes (Signed)
 [facility] Group Notes:  (Nursing/MHT/Case Management/Adjunct)  Date:  02/13/2024  Time:  2000  Type of Therapy:   Wrap up group  Participation Level:  Active  Participation Quality:  Appropriate, Attentive, Sharing, and Supportive  Affect:  Appropriate  Cognitive:  Alert  Insight:  Improving  Engagement in Group:  Engaged  Modes of Intervention:  Clarification, Education, Socialization, and Support  Summary of Progress/Problems: Positive thinking and positive change were discussed.   Marcille Buffy 02/13/2024, 9:54 PM

## 2024-02-13 NOTE — Progress Notes (Signed)
   02/13/24 2305  Psych Admission Type (Psych Patients Only)  Admission Status Voluntary  Psychosocial Assessment  Patient Complaints Anxiety  Eye Contact Fair  Facial Expression Animated  Affect Appropriate to circumstance  Speech Logical/coherent  Interaction Assertive  Motor Activity Other (Comment) (WDL)  Appearance/Hygiene Unremarkable  Behavior Characteristics Appropriate to situation  Mood Pleasant  Thought Process  Coherency WDL  Content WDL  Delusions None reported or observed  Perception WDL  Hallucination None reported or observed  Judgment WDL  Confusion None  Danger to Self  Current suicidal ideation? Denies  Self-Injurious Behavior No self-injurious ideation or behavior indicators observed or expressed   Agreement Not to Harm Self Yes  Description of Agreement verbal  Danger to Others  Danger to Others None reported or observed

## 2024-02-14 DIAGNOSIS — F332 Major depressive disorder, recurrent severe without psychotic features: Secondary | ICD-10-CM

## 2024-02-14 MED ORDER — QUETIAPINE FUMARATE 50 MG PO TABS
50.0000 mg | ORAL_TABLET | Freq: Every day | ORAL | Status: DC
  Administered 2024-02-14 – 2024-02-17 (×4): 50 mg via ORAL
  Filled 2024-02-14 (×8): qty 1

## 2024-02-14 NOTE — Progress Notes (Signed)
   02/14/24 0530  15 Minute Checks  Location Bedroom  Visual Appearance Calm  Behavior Sleeping  Sleep (Behavioral Health Patients Only)  Calculate sleep? (Click Yes once per 24 hr at 0600 safety check) Yes  Documented sleep last 24 hours 7.5

## 2024-02-14 NOTE — Progress Notes (Signed)
   02/14/24 1300  Psych Admission Type (Psych Patients Only)  Admission Status Voluntary  Psychosocial Assessment  Patient Complaints Anxiety;Depression  Eye Contact Fair  Facial Expression Animated  Affect Appropriate to circumstance  Speech Logical/coherent  Interaction Assertive  Motor Activity Slow  Appearance/Hygiene Unremarkable  Behavior Characteristics Cooperative;Appropriate to situation  Mood Pleasant;Sad  Thought Process  Coherency WDL  Content WDL  Delusions None reported or observed  Perception WDL  Hallucination None reported or observed  Judgment WDL  Confusion None  Danger to Self  Current suicidal ideation? Denies  Self-Injurious Behavior No self-injurious ideation or behavior indicators observed or expressed   Danger to Others  Danger to Others None reported or observed

## 2024-02-14 NOTE — [facility] Group Notes (Signed)
 Adult Psychoeducational Group Note  Date:  02/14/2024 Time:  10:49 PM  Group Topic/Focus:  Wrap-Up Group:   The focus of this group is to help patients review their daily goal of treatment and discuss progress on daily workbooks.  Participation Level:  Active  Participation Quality:  Appropriate  Affect:  Appropriate  Cognitive:  Appropriate  Insight: Appropriate  Engagement in Group:  Engaged  Modes of Intervention:  Discussion and Support  Additional Comments:  This group discussed short-term goal planning for the coming week.  Christ Kick 02/14/2024, 10:49 PM

## 2024-02-14 NOTE — Group Note (Signed)
 Date:  02/14/2024 Time:  9:02 AM  Group Topic/Focus:  Goals Group:   The focus of this group is to help patients establish daily goals to achieve during treatment and discuss how the patient can incorporate goal setting into their daily lives to aide in recovery. Orientation:   The focus of this group is to educate the patient on the purpose and policies of crisis stabilization and provide a format to answer questions about their admission.  The group details unit policies and expectations of patients while admitted.    Participation Level:  Active  Participation Quality:  Appropriate  Affect:  Appropriate  Cognitive:  Appropriate  Insight: Appropriate  Engagement in Group:  Engaged  Modes of Intervention:  Orientation  Additional Comments:  Goal is to remain strong to be able to visit with kids soon  Debbie Mueller 02/14/2024, 9:02 AM

## 2024-02-14 NOTE — Progress Notes (Signed)
 [facility] MD Progress Note  02/14/2024 2:37 PM Debbie Mueller  MRN:  [id number] Subjective:   43 year old Caucasian female with remote history of substance use disorder, PTSD and major depressive disorder. Lost custody of her two children, has no contact with one of them. Recently evicted and was staying in motels until she ran out of funds. Presented with worsening depression associated with hopelessness and worthlessness.  Patient had been off psychotropic medications for a couple of months prior to presentation. Routine labs are essentially normal.  No alcohol or any psychoactive substance on board.  Chart reviewed today.  Patient discussed at multidisciplinary team meeting.  Nursing staff reports that patient slept for 7.5 hours.  Her blood pressure is relatively lower since we optimized dose of lisinopril.  She is still in the day range for hypertension.  No challenging behavior.  She has been participating with unit groups and therapeutic activities.  Seen today.  Patient states that mentally she is feeling better with her regimen.  She requested for a higher dose of quetiapine as she gets restful sleep with a higher dosage.  We have agreed to optimize to 50 mg.  Patient is processing her next step in here.  States that she is considering [facility] as an alternative as she gets $967 a month.  She still has some anxiety which is triggered by her homelessness.  No current suicidal thoughts.  No homicidal thoughts.  No thoughts of violence.  There are no manic symptoms.  There are no psychotic symptoms. Encouraged to keep ventilating her feelings to staff.     Principal Problem: MDD (major depressive disorder), recurrent severe, without psychosis (HCC) Diagnosis: Principal Problem:   MDD (major depressive disorder), recurrent severe, without psychosis (HCC) Active Problems:   Tobacco use disorder   Generalized anxiety disorder   PTSD (post-traumatic stress disorder)  Total Time spent with  patient: 30 minutes  Past Psychiatric History:  See H&P.  Past Medical History:  Past Medical History:  Diagnosis Date   Acid reflux    Anxiety    Anxiety disorder 2015   Depression    PP depression   Loss of teeth due to gum disease    wears dentures   Overactive bladder    Panic attacks 2015   PTSD (post-traumatic stress disorder) 2015   Scoliosis    Vitamin D deficiency     Past Surgical History:  Procedure Laterality Date   CESAREAN SECTION     TUBAL LIGATION Bilateral 04/30/2018   Procedure: POST PARTUM TUBAL LIGATION;  Surgeon: Catalina Antigua, MD;  Location: [facility];  Service: Gynecology;  Laterality: Bilateral;   Family History:  Family History  Problem Relation Age of Onset   Diabetes Paternal Grandfather    Hypertension Paternal Grandfather    Diabetes Paternal Grandmother    Hypertension Paternal Grandmother    Heart attack Maternal Grandmother    Heart attack Maternal Grandfather    Anemia Father    Hypertension Father    Breast cancer Mother    Depression Brother    Other Son        acid reflux   Family Psychiatric  History:  See H&P.  Social History:  Social History   Substance and Sexual Activity  Alcohol Use Not Currently   Comment: reports drinking 2 standard drinks 2-3x weekly     Social History   Substance and Sexual Activity  Drug Use Not Currently    Social History   Socioeconomic History  Marital status: Single    Spouse name: Not on file   Number of children: 2   Years of education: Not on file   Highest education level: Not on file  Occupational History   Not on file  Tobacco Use   Smoking status: Every Day    Current packs/day: 0.01    Average packs/day: (0.1 ttl pk-yrs)    Types: Cigarettes   Smokeless tobacco: Never   Tobacco comments:    Pt wants to quit but having difficulty  Vaping Use   Vaping status: Never Used  Substance and Sexual Activity   Alcohol use: Not Currently    Comment: reports  drinking 2 standard drinks 2-3x weekly   Drug use: Not Currently   Sexual activity: Yes    Birth control/protection: Surgical    Comment: tubal  Other Topics Concern   Not on file  Social History Narrative   Pt recently evicted from her housing. Living in hotels for last 2 weeks. Receives SSI. Lost custody of children 13 months ago. Once child in foster care and other living with relatives.   Social Drivers of Corporate investment banker Strain: Not on file  Food Insecurity: Food Insecurity Present (02/12/2024)   Hunger Vital Sign    Worried About Running Out of Food in the Last Year: Often true    Ran Out of Food in the Last Year: Often true  Transportation Needs: Unmet Transportation Needs (02/12/2024)   PRAPARE - Administrator, Civil Service (Medical): Yes    Lack of Transportation (Non-Medical): Yes  Physical Activity: Not on file  Stress: Not on file  Social Connections: Not on file   Additional Social History:    Current Medications: Current Facility-Administered Medications  Medication Dose Route Frequency Provider Last Rate Last Admin   acetaminophen (TYLENOL) tablet 650 mg  650 mg Oral Q6H PRN Starleen Blue, NP   650 mg at 02/12/24 0041   alum & mag hydroxide-simeth (MAALOX/MYLANTA) 200-200-20 MG/5ML suspension 30 mL  30 mL Oral Q4H PRN Starleen Blue, NP       haloperidol lactate (HALDOL) injection 10 mg  10 mg Intramuscular TID PRN Starleen Blue, NP       And   diphenhydrAMINE (BENADRYL) injection 50 mg  50 mg Intramuscular TID PRN Starleen Blue, NP       And   LORazepam (ATIVAN) injection 2 mg  2 mg Intramuscular TID PRN Starleen Blue, NP       haloperidol lactate (HALDOL) injection 5 mg  5 mg Intramuscular TID PRN Starleen Blue, NP       And   diphenhydrAMINE (BENADRYL) injection 50 mg  50 mg Intramuscular TID PRN Starleen Blue, NP       And   LORazepam (ATIVAN) injection 2 mg  2 mg Intramuscular TID PRN Starleen Blue, NP       ferrous sulfate  tablet 325 mg  325 mg Oral Q breakfast Peterson Ao, MD   325 mg at 02/14/24 0750   hydrOXYzine (ATARAX) tablet 25 mg  25 mg Oral TID PRN Starleen Blue, NP   25 mg at 02/13/24 2104   loratadine (CLARITIN) tablet 10 mg  10 mg Oral Daily Peterson Ao, MD   10 mg at 02/14/24 0750   magnesium hydroxide (MILK OF MAGNESIA) suspension 30 mL  30 mL Oral Daily PRN Starleen Blue, NP       melatonin tablet 5 mg  5 mg Oral QHS Peterson Ao, MD  5 mg at 02/13/24 2104   nicotine (NICODERM CQ - dosed in mg/24 hours) patch 14 mg  14 mg Transdermal Daily Peterson Ao, MD   14 mg at 02/14/24 0750   OLANZapine zydis (ZYPREXA) disintegrating tablet 5 mg  5 mg Oral TID PRN Starleen Blue, NP       pantoprazole (PROTONIX) EC tablet 40 mg  40 mg Oral Daily Peterson Ao, MD   40 mg at 02/14/24 0750   prazosin (MINIPRESS) capsule 1 mg  1 mg Oral QHS Peterson Ao, MD   1 mg at 02/13/24 2104   QUEtiapine (SEROQUEL) tablet 50 mg  50 mg Oral QHS Lewin Pellow, Delight Ovens, MD       venlafaxine XR (EFFEXOR-XR) 24 hr capsule 75 mg  75 mg Oral Q breakfast Peterson Ao, MD   75 mg at 02/14/24 0750   vitamin D3 (CHOLECALCIFEROL) tablet 1,000 Units  1,000 Units Oral Daily Peterson Ao, MD   1,000 Units at 02/14/24 0750    Lab Results: No results found for this or any previous visit (from the past 48 hours).  Blood Alcohol level:  Lab Results  Component Value Date   ETH <10 02/11/2024   ETH <10 04/29/2023    Metabolic Disorder Labs: Lab Results  Component Value Date   HGBA1C 5.3 02/11/2024   MPG 105.41 02/11/2024   MPG 111 05/01/2023   No results found for: "PROLACTIN" Lab Results  Component Value Date   CHOL 172 02/11/2024   TRIG 204 (H) 02/11/2024   HDL 37 (L) 02/11/2024   CHOLHDL 4.6 02/11/2024   VLDL 41 (H) 02/11/2024   LDLCALC 94 02/11/2024   LDLCALC 101 (H) 07/15/2022    Physical Findings: AIMS:  , ,  ,  ,    CIWA:    COWS:     Musculoskeletal: Strength & Muscle Tone: within  normal limits Gait & Station: normal Patient leans: N/A  Psychiatric Specialty Exam:  Presentation  General Appearance:  Casually dressed, overweight, not in any distress, good relatedness.  No EPS.  Eye Contact: Good  Speech: Spontaneous.  Normal rate, tone and volume.  Mood and Affect  Mood: Subjectively and objectively better.  Affect: Restricted and appropriate.  Thought Process  Thought Processes: Linear and goal directed.  Descriptions of Associations:Intact  Orientation:Full (Time, Place and Person)  Thought Content: Less negative ruminations.  No guilty ruminations.  No suicidal thoughts.  No homicidal thoughts.  No thoughts of violence.  No delusional theme.  No obsessions.  Hallucinations: No hallucination in any modality.  Sensorium  Memory: Good.  Judgment: Good.  Insight: Good.   Executive Functions  Concentration: Good.  Attention Span: Good.  Recall: Good.  Fund of Knowledge: Good.  Language: Good.  Psychomotor Activity  Normalizing psychomotor activity.   Physical Exam: Physical Exam ROS Blood pressure (!) 131/101, pulse 100, temperature 98.3 F (36.8 C), temperature source Oral, resp. rate 18, height 5\' 1"  (1.549 m), weight 125.4 kg, last menstrual period 02/05/2024, SpO2 98%. Body mass index is 52.23 kg/m.   Treatment Plan Summary: 43 year old Caucasian female with remote history of substance use disorder but has been sober for a couple of years.  Presented in context of worsening depression associated with suicidal thoughts.  Homelessness and inability to see her child who is in foster care as main perpetuating factor.  We restarted her psychotropic medications during this admission.  She is tolerating them well and responding appropriately to treatment.  She is future oriented and hopes to  get into an [facility] house.  We will optimize quetiapine to improve her quality of sleep.  We will keep her SNRI at the same dose and  evaluate her further.  Hopeful discharge by middle of the week.  1.  Increase quetiapine to 50 mg at bedtime. 2.  Continue venlafaxine XL 75 mg daily. 3.  Continue prazosin 2 mg at bedtime. 4.  Continue to monitor mood behavior and interaction with others. 5.  Continue to encourage unit groups and therapeutic activities. 6.  Social worker will coordinate discharge and aftercare planning.   Georgiann Cocker, MD 02/14/2024, 2:37 PM

## 2024-02-14 NOTE — Plan of Care (Signed)
   Problem: Education: Goal: Emotional status will improve Outcome: Progressing Goal: Mental status will improve Outcome: Progressing

## 2024-02-14 NOTE — Progress Notes (Signed)
   02/14/24 2155  Psych Admission Type (Psych Patients Only)  Admission Status Voluntary  Psychosocial Assessment  Patient Complaints Anxiety  Eye Contact Fair  Facial Expression Animated  Affect Appropriate to circumstance  Speech Logical/coherent  Interaction Assertive  Motor Activity Other (Comment) (WDL)  Appearance/Hygiene Unremarkable  Behavior Characteristics Appropriate to situation  Mood Pleasant  Thought Process  Coherency WDL  Content WDL  Delusions None reported or observed  Perception WDL  Hallucination None reported or observed  Judgment WDL  Confusion None  Danger to Self  Current suicidal ideation? Denies  Self-Injurious Behavior No self-injurious ideation or behavior indicators observed or expressed   Agreement Not to Harm Self Yes  Description of Agreement verbal  Danger to Others  Danger to Others None reported or observed

## 2024-02-15 DIAGNOSIS — F332 Major depressive disorder, recurrent severe without psychotic features: Secondary | ICD-10-CM | POA: Diagnosis not present

## 2024-02-15 LAB — FOLATE: Folate: 4.2 ng/mL — ABNORMAL LOW (ref >5.9)

## 2024-02-15 MED ORDER — VITAMIN D (ERGOCALCIFEROL) 1.25 MG (50000 UNIT) PO CAPS
50000.0000 [IU] | ORAL_CAPSULE | ORAL | Status: DC
  Administered 2024-02-16: 50000 [IU] via ORAL
  Filled 2024-02-15: qty 1

## 2024-02-15 MED ORDER — VITAMIN B-12 1000 MCG PO TABS
1000.0000 ug | ORAL_TABLET | Freq: Every day | ORAL | Status: DC
  Administered 2024-02-15 – 2024-02-18 (×4): 1000 ug via ORAL
  Filled 2024-02-15 (×7): qty 1

## 2024-02-15 NOTE — Group Note (Signed)
 Date:  02/15/2024 Time:  11:09 AM  Group Topic/Focus:  Orientation:   The focus of this group is to educate the patient on the purpose and policies of crisis stabilization and provide a format to answer questions about their admission.  The group details unit policies and expectations of patients while admitted.    Participation Level:  Active  Participation Quality:  Appropriate  Affect:  Appropriate  Cognitive:  Appropriate  Insight: Appropriate  Engagement in Group:  Developing/Improving  Modes of Intervention:  Education  Additional Comments:    Thora Scherman D Zyia Kaneko 02/15/2024, 11:09 AM

## 2024-02-15 NOTE — Group Note (Signed)
 Recreation Therapy Group Note   Group Topic:Stress Management  Group Date: 02/15/2024 Start Time: 0945 End Time: 1000 Facilitators: Lyndel Sarate-McCall, LRT,CTRS Location: [address] Dayroom   Group Topic: Stress Management   Goal Area(s) Addresses:  Patient will actively participate in stress management techniques presented during session.  Patient will successfully identify benefit of practicing stress management post d/c.   Intervention: Relaxation exercise with ambient sound and script   Activity: Guided Imagery. LRT provided education, instruction, and demonstration on practice of visualization via guided imagery. Patient was asked to participate in the technique introduced during session. LRT debriefed including topics of mindfulness, stress management and specific scenarios each patient could use these techniques. Patients were given suggestions of ways to access scripts post d/c and encouraged to explore Youtube and other apps available on smartphones, tablets, and computers.  Education:  Stress Management, Discharge Planning.   Education Outcome: Acknowledges education   Affect/Mood: Appropriate   Participation Level: Engaged   Participation Quality: Independent   Behavior: Appropriate   Speech/Thought Process: Focused   Insight: Good   Judgement: Good   Modes of Intervention: Guided Imagery   Patient Response to Interventions:  Engaged   Education Outcome:  In group clarification offered    Clinical Observations/Individualized Feedback: Pt was focused and attentive throughout group session. Pt was able to block out distractions from peer and remain engaged in the activity.   Plan: Continue to engage patient in RT group sessions 2-3x/week.   Jolayne Branson-McCall, LRT,CTRS 02/15/2024 12:47 PM

## 2024-02-15 NOTE — Progress Notes (Addendum)
 D:  Patient's self inventory sheet, patient sleeps good, sleep medication given  Good appetite, normal energy level, good concentration.  Rated depression, hopeless, and anxiety #8.  Denied SI.  Denied withdrawals.  Denied physical problems.  Denied physical pain.  Goal get housing, go to [facility] or [facility] to proof for the court, to get help and treatment .  Plans to talk to SW, get housing.  Does have discharge plans. A:  Medications administered per MD orders.  Emotional support and encouragement given patient. R:  Denied SI and HI, contracts for safety.  Denied AV hallucinations.  Safety maintained with 15 minute checks.

## 2024-02-15 NOTE — Group Note (Signed)
 Therapy Group Note  Group Topic:Other  Group Date: 02/15/2024 Start Time: 1430 End Time: 1500 Facilitators: Ted Mcalpine, OT    The primary objective of this topic is to explore and understand the concept of occupational balance in the context of daily living. The term "occupational balance" is defined broadly, encompassing all activities that occupy an individual's time and energy, including self-care, leisure, and work-related tasks. The goal is to guide participants towards achieving a harmonious blend of these activities, tailored to their personal values and life circumstances. This balance is aimed at enhancing overall well-being, not by equally distributing time across activities, but by ensuring that daily engagements are fulfilling and not draining. The content delves into identifying various barriers that individuals face in achieving occupational balance, such as overcommitment, misaligned priorities, external pressures, and lack of effective time management. The impact of these barriers on occupational performance, roles, and lifestyles is examined, highlighting issues like reduced efficiency, strained relationships, and potential health problems. Strategies for cultivating occupational balance are a key focus. These strategies include practical methods like time blocking, prioritizing tasks, establishing self-care rituals, decluttering, connecting with nature, and engaging in reflective practices. These approaches are designed to be adaptable and applicable to a wide range of life scenarios, promoting a proactive and mindful approach to daily living. The overall aim is to equip participants with the knowledge and tools to create a balanced lifestyle that supports their mental, emotional, and physical health, thereby improving their functional performance in daily life.     Participation Level: Engaged   Participation Quality: Independent   Behavior: Appropriate   Speech/Thought  Process: Relevant   Affect/Mood: Appropriate   Insight: Fair   Judgement: Fair      Modes of Intervention: Education  Patient Response to Interventions:  Attentive   Plan: Continue to engage patient in OT groups 2 - 3x/week.  02/15/2024  Ted Mcalpine, OT  Kerrin Champagne, OT

## 2024-02-15 NOTE — Progress Notes (Signed)
 [facility] MD Progress Note  02/15/2024 1:30 PM Debbie Mueller  MRN:  [id number]  Subjective:   43 year old Caucasian female with remote history of substance use disorder, PTSD and major depressive disorder. Lost custody of her two children, has no contact with one of them. Recently evicted and was staying in motels until she ran out of funds. Presented with worsening depression associated with hopelessness and worthlessness.  Patient had been off psychotropic medications for a couple of months prior to presentation. Routine labs are essentially normal.  No alcohol or any psychoactive substance on board.  Chart reviewed today.  Patient discussed at multidisciplinary team meeting.  Nursing staff reports that patient slept for 8 hours.  Her blood pressure 125/71 pulse 98. No challenging behavior.  She has been participating with unit groups and therapeutic activities.  Yesterday's psychiatric team recommendation: 1.  Increase quetiapine to 50 mg at bedtime. 2.  Continue venlafaxine XL 75 mg daily. 3.  Continue prazosin 2 mg at bedtime.  Today's assessment notes: Patient seen and examined during rounds.  She presents alert, calm, and oriented to person, time, place, and situation.  Reports mood is depressed due to homelessness and her family not believing that she has been off drugs for the past 6 years.  Reports currently fighting back to get her children back from DSS custody.  Expressed interest in attending parenting classes after discharge from the hospital.  Report anxiety of #7/10 and depression of #7/10, with 10 being high severity.  She reports history of paranoia, added, "I have paranoia most of my life thinking people are looking at me and following me to harm me. " Report being compliant with her psychotropic medications.  Reports sleeping through the night with quetiapine 50 mg p.o. q. nightly.  She denies suicidal thoughts.  No homicidal thoughts.  No thoughts of violence.  There are no manic  symptoms.  There are no psychotic symptoms. Encouraged to keep ventilating her feelings to staff.  Principal Problem: MDD (major depressive disorder), recurrent severe, without psychosis (HCC) Diagnosis: Principal Problem:   MDD (major depressive disorder), recurrent severe, without psychosis (HCC) Active Problems:   Tobacco use disorder   Generalized anxiety disorder   PTSD (post-traumatic stress disorder)  Total Time spent with patient: 45 minutes  Past Psychiatric History:  See H&P.  Past Medical History:  Past Medical History:  Diagnosis Date   Acid reflux    Anxiety    Anxiety disorder 2015   Depression    PP depression   Loss of teeth due to gum disease    wears dentures   Overactive bladder    Panic attacks 2015   PTSD (post-traumatic stress disorder) 2015   Scoliosis    Vitamin D deficiency     Past Surgical History:  Procedure Laterality Date   CESAREAN SECTION     TUBAL LIGATION Bilateral 04/30/2018   Procedure: POST PARTUM TUBAL LIGATION;  Surgeon: Catalina Antigua, MD;  Location: [facility];  Service: Gynecology;  Laterality: Bilateral;   Family History:  Family History  Problem Relation Age of Onset   Diabetes Paternal Grandfather    Hypertension Paternal Grandfather    Diabetes Paternal Grandmother    Hypertension Paternal Grandmother    Heart attack Maternal Grandmother    Heart attack Maternal Grandfather    Anemia Father    Hypertension Father    Breast cancer Mother    Depression Brother    Other Son        acid  reflux   Family Psychiatric  History:  See H&P.  Social History:  Social History   Substance and Sexual Activity  Alcohol Use Not Currently   Comment: reports drinking 2 standard drinks 2-3x weekly     Social History   Substance and Sexual Activity  Drug Use Not Currently    Social History   Socioeconomic History   Marital status: Single    Spouse name: Not on file   Number of children: 2   Years of education:  Not on file   Highest education level: Not on file  Occupational History   Not on file  Tobacco Use   Smoking status: Every Day    Current packs/day: 0.01    Average packs/day: (0.1 ttl pk-yrs)    Types: Cigarettes   Smokeless tobacco: Never   Tobacco comments:    Pt wants to quit but having difficulty  Vaping Use   Vaping status: Never Used  Substance and Sexual Activity   Alcohol use: Not Currently    Comment: reports drinking 2 standard drinks 2-3x weekly   Drug use: Not Currently   Sexual activity: Yes    Birth control/protection: Surgical    Comment: tubal  Other Topics Concern   Not on file  Social History Narrative   Pt recently evicted from her housing. Living in hotels for last 2 weeks. Receives SSI. Lost custody of children 13 months ago. Once child in foster care and other living with relatives.   Social Drivers of Corporate investment banker Strain: Not on file  Food Insecurity: Food Insecurity Present (02/12/2024)   Hunger Vital Sign    Worried About Running Out of Food in the Last Year: Often true    Ran Out of Food in the Last Year: Often true  Transportation Needs: Unmet Transportation Needs (02/12/2024)   PRAPARE - Administrator, Civil Service (Medical): Yes    Lack of Transportation (Non-Medical): Yes  Physical Activity: Not on file  Stress: Not on file  Social Connections: Not on file   Additional Social History:    Current Medications: Current Facility-Administered Medications  Medication Dose Route Frequency Provider Last Rate Last Admin   acetaminophen (TYLENOL) tablet 650 mg  650 mg Oral Q6H PRN Starleen Blue, NP   650 mg at 02/12/24 0041   alum & mag hydroxide-simeth (MAALOX/MYLANTA) 200-200-20 MG/5ML suspension 30 mL  30 mL Oral Q4H PRN Starleen Blue, NP       haloperidol lactate (HALDOL) injection 10 mg  10 mg Intramuscular TID PRN Starleen Blue, NP       And   diphenhydrAMINE (BENADRYL) injection 50 mg  50 mg Intramuscular TID  PRN Starleen Blue, NP       And   LORazepam (ATIVAN) injection 2 mg  2 mg Intramuscular TID PRN Starleen Blue, NP       haloperidol lactate (HALDOL) injection 5 mg  5 mg Intramuscular TID PRN Starleen Blue, NP       And   diphenhydrAMINE (BENADRYL) injection 50 mg  50 mg Intramuscular TID PRN Starleen Blue, NP       And   LORazepam (ATIVAN) injection 2 mg  2 mg Intramuscular TID PRN Starleen Blue, NP       ferrous sulfate tablet 325 mg  325 mg Oral Q breakfast Peterson Ao, MD   325 mg at 02/15/24 0813   hydrOXYzine (ATARAX) tablet 25 mg  25 mg Oral TID PRN Starleen Blue, NP  25 mg at 02/14/24 2111   loratadine (CLARITIN) tablet 10 mg  10 mg Oral Daily Peterson Ao, MD   10 mg at 02/15/24 0813   magnesium hydroxide (MILK OF MAGNESIA) suspension 30 mL  30 mL Oral Daily PRN Starleen Blue, NP       melatonin tablet 5 mg  5 mg Oral QHS Peterson Ao, MD   5 mg at 02/14/24 2111   nicotine (NICODERM CQ - dosed in mg/24 hours) patch 14 mg  14 mg Transdermal Daily Peterson Ao, MD   14 mg at 02/15/24 0813   OLANZapine zydis (ZYPREXA) disintegrating tablet 5 mg  5 mg Oral TID PRN Starleen Blue, NP       pantoprazole (PROTONIX) EC tablet 40 mg  40 mg Oral Daily Peterson Ao, MD   40 mg at 02/15/24 1610   prazosin (MINIPRESS) capsule 1 mg  1 mg Oral QHS Peterson Ao, MD   1 mg at 02/14/24 2111   QUEtiapine (SEROQUEL) tablet 50 mg  50 mg Oral QHS Georgiann Cocker, MD   50 mg at 02/14/24 2111   venlafaxine XR (EFFEXOR-XR) 24 hr capsule 75 mg  75 mg Oral Q breakfast Peterson Ao, MD   75 mg at 02/15/24 0813   vitamin D3 (CHOLECALCIFEROL) tablet 1,000 Units  1,000 Units Oral Daily Peterson Ao, MD   1,000 Units at 02/15/24 9604   Lab Results: No results found for this or any previous visit (from the past 48 hours).  Blood Alcohol level:  Lab Results  Component Value Date   ETH <10 02/11/2024   ETH <10 04/29/2023   Metabolic Disorder Labs: Lab Results  Component  Value Date   HGBA1C 5.3 02/11/2024   MPG 105.41 02/11/2024   MPG 111 05/01/2023   No results found for: "PROLACTIN" Lab Results  Component Value Date   CHOL 172 02/11/2024   TRIG 204 (H) 02/11/2024   HDL 37 (L) 02/11/2024   CHOLHDL 4.6 02/11/2024   VLDL 41 (H) 02/11/2024   LDLCALC 94 02/11/2024   LDLCALC 101 (H) 07/15/2022   Physical Findings: AIMS:  , ,  ,  ,    CIWA:    COWS:     Musculoskeletal: Strength & Muscle Tone: within normal limits Gait & Station: normal Patient leans: N/A  Psychiatric Specialty Exam:  Presentation  General Appearance:  Casually dressed, overweight, not in any distress, good relatedness.  No EPS.  Eye Contact: Good  Speech: Spontaneous.  Normal rate, tone and volume.  Mood and Affect  Mood: Subjectively and objectively better.  Affect: Restricted and appropriate.  Thought Process  Thought Processes: Linear and goal directed.  Descriptions of Associations:Intact  Orientation:Full (Time, Place and Person)  Thought Content: Less negative ruminations.  No guilty ruminations.  No suicidal thoughts.  No homicidal thoughts.  No thoughts of violence.  No delusional theme.  No obsessions.  Hallucinations: No hallucination in any modality.  Sensorium  Memory: Good.  Judgment: Good.  Insight: Good.  Executive Functions  Concentration: Good.  Attention Span: Good.  Recall: Good.  Fund of Knowledge: Good.  Language: Good.  Psychomotor Activity  Normalizing psychomotor activity.  Physical Exam: Physical Exam Vitals and nursing note reviewed.  Constitutional:      Appearance: She is obese.     Comments: Patient is morbidly obese  HENT:     Head: Normocephalic.     Right Ear: External ear normal.     Left Ear: External ear normal.  Nose: Nose normal.     Mouth/Throat:     Mouth: Mucous membranes are moist.     Pharynx: Oropharynx is clear.  Eyes:     Extraocular Movements: Extraocular movements  intact.  Cardiovascular:     Rate and Rhythm: Normal rate.     Pulses: Normal pulses.  Pulmonary:     Effort: Pulmonary effort is normal.  Abdominal:     Comments: Deferred  Genitourinary:    Comments: Deferred Musculoskeletal:        General: Normal range of motion.     Cervical back: Normal range of motion.  Skin:    General: Skin is warm.  Neurological:     General: No focal deficit present.     Mental Status: She is alert and oriented to person, place, and time.  Psychiatric:        Mood and Affect: Mood normal.        Behavior: Behavior normal.        Thought Content: Thought content normal.    Review of Systems  Constitutional:  Negative for chills and fever.  HENT:  Negative for sore throat.   Eyes:  Negative for blurred vision.  Respiratory:  Negative for cough, sputum production, shortness of breath and wheezing.   Cardiovascular:  Negative for chest pain and palpitations.  Gastrointestinal:  Negative for abdominal pain, constipation, diarrhea, heartburn, nausea and vomiting.  Genitourinary:  Negative for dysuria, frequency and urgency.  Musculoskeletal:  Negative for myalgias.  Skin:  Negative for itching and rash.  Neurological:  Negative for dizziness, tingling and headaches.  Endo/Heme/Allergies:        No known drug allergies  Psychiatric/Behavioral:  Positive for depression. Negative for hallucinations and suicidal ideas. The patient is nervous/anxious. The patient does not have insomnia.    Blood pressure 125/71, pulse 98, temperature 98.4 F (36.9 C), temperature source Oral, resp. rate 18, height 5\' 1"  (1.549 m), weight 125.4 kg, last menstrual period 02/05/2024, SpO2 99%. Body mass index is 52.23 kg/m.  Treatment Plan Summary: 43 year old Caucasian female with remote history of substance use disorder but has been sober for a couple of years.  Presented in context of worsening depression associated with suicidal thoughts.  Homelessness and inability to  see her child who is in foster care as main perpetuating factor.  We restarted her psychotropic medications during this admission.  She is tolerating them well and responding appropriately to treatment.  She is future oriented and hopes to get into an [facility].  We will optimize quetiapine to improve her quality of sleep.  We will keep her SNRI at the same dose and evaluate her further.  Hopeful discharge by middle of the week.  1.  Continue quetiapine to 50 mg at bedtime. 2.  Continue venlafaxine XL 75 mg daily. 3.  Continue prazosin 2 mg at bedtime. 4.  Continue to monitor mood behavior and interaction with others. 5.  Continue to encourage unit groups and therapeutic activities. 6.  Social worker will coordinate discharge and aftercare planning.   Cecilie Lowers, FNP 02/15/2024, 1:30 PM Patient ID: Debbie Mueller, female   DOB: Apr 12, 1981, 43 y.o.   MRN: [id number]

## 2024-02-15 NOTE — Plan of Care (Signed)
  Problem: Education: Goal: Mental status will improve Outcome: Progressing   Problem: Activity: Goal: Sleeping patterns will improve Outcome: Progressing   

## 2024-02-15 NOTE — Progress Notes (Signed)
   02/15/24 0541  15 Minute Checks  Location Bedroom  Visual Appearance Calm  Behavior Sleeping  Sleep (Behavioral Health Patients Only)  Calculate sleep? (Click Yes once per 24 hr at 0600 safety check) Yes  Documented sleep last 24 hours 7

## 2024-02-15 NOTE — Plan of Care (Signed)
 Nurse discussed anxiety, depression and coping skills with patient.

## 2024-02-15 NOTE — [facility] Group Notes (Signed)
 Group Goal: Support / Education around grief and loss  Members engage in facilitated group support and psycho-social education.  Group Description: Following introductions and group rules, group members engaged in facilitated group dialog and support around topic of loss, with particular support around experiences of loss in their lives. Group Identified types of loss (relationships / self / things) and identified patterns, circumstances, and changes that precipitate losses. Reflected on thoughts / feelings around loss, normalized grief responses, and recognized variety in grief experience. Group noted Worden's four tasks of grief in discussion.  Group drew on Adlerian / Rogerian, narrative, MI, and group theory of [facility] Group.  Patient Progress: Debbie Mueller was an active participant in the group. Initially reserved, she gradually offered more input and was willing to be open and even stayed after group where she continued mutual empathy with peers.  Debbie Mueller, M.Div [phone]

## 2024-02-15 NOTE — [facility] Group Notes (Signed)
Patient attended the AA group. 

## 2024-02-16 ENCOUNTER — Encounter (HOSPITAL_COMMUNITY): Payer: Self-pay | Admitting: Psychiatry

## 2024-02-16 DIAGNOSIS — E559 Vitamin D deficiency, unspecified: Secondary | ICD-10-CM

## 2024-02-16 DIAGNOSIS — F332 Major depressive disorder, recurrent severe without psychotic features: Secondary | ICD-10-CM | POA: Diagnosis not present

## 2024-02-16 DIAGNOSIS — E538 Deficiency of other specified B group vitamins: Secondary | ICD-10-CM

## 2024-02-16 HISTORY — DX: Vitamin D deficiency, unspecified: E55.9

## 2024-02-16 HISTORY — DX: Deficiency of other specified B group vitamins: E53.8

## 2024-02-16 MED ORDER — VENLAFAXINE HCL ER 150 MG PO CP24
150.0000 mg | ORAL_CAPSULE | Freq: Every day | ORAL | Status: DC
  Administered 2024-02-17 – 2024-02-18 (×2): 150 mg via ORAL
  Filled 2024-02-16 (×4): qty 1

## 2024-02-16 MED ORDER — VITAMIN D3 25 MCG PO TABS
2000.0000 [IU] | ORAL_TABLET | Freq: Two times a day (BID) | ORAL | Status: DC
  Administered 2024-02-16 – 2024-02-18 (×5): 2000 [IU] via ORAL
  Filled 2024-02-16 (×9): qty 2

## 2024-02-16 MED ORDER — FOLIC ACID 1 MG PO TABS
1.0000 mg | ORAL_TABLET | Freq: Every day | ORAL | Status: DC
  Administered 2024-02-16 – 2024-02-18 (×3): 1 mg via ORAL
  Filled 2024-02-16 (×5): qty 1

## 2024-02-16 NOTE — Progress Notes (Addendum)
 [facility] MD Progress Note  02/16/2024 12:17 PM Debbie Mueller  MRN:  [id number]  Subjective: Debbie Mueller is a 43 y.o. female with a past psychiatric history of SUD, MDD, GAD, PTSD who [facility] on [facility]/13 with worsening depression in the setting psychosocial stressors. She was voluntarily  admitted to the [facility] on 3/14 for medication management and psychiatric symptom stabilization. Stressors includes: Lost custody of her two children, has no contact with one of them. Recently evicted and was staying in motels until she ran out of funds.   Chart reviewed today. Patient discussed at multidisciplinary team meeting.  Nursing staff reports that patient slept for 7.5 hours. Her most recent blood pressure 114/78 pulse 94. No challenging behavior. She has been participating with unit groups and therapeutic activities. Debbie Mueller has taken all prescribed medications in the last 24 hours, has not required in prn medications in same timeframe.   Yesterday's psychiatric team recommendation: 1.  Continue quetiapine to 50 mg at bedtime. 2.  Continue venlafaxine XL 75 mg daily. 3.  Continue prazosin 1 mg at bedtime. 4.  Continue to monitor mood behavior and interaction with others. 5.  Continue to encourage unit groups and therapeutic activities. 6.  Social worker will coordinate discharge and aftercare planning.  Today's assessment notes: Debbie Mueller was evaluated face to face in provider office sitting upright in chair without appearing in acute distress. She presented alert and oriented to self, time, place and situation. Her appearance was casual, and somewhat withdrawn. Her eye contact was fair, speech clear and coherent with normal rate and volume. Her mood was Anxious 6/10 and Depressed 7/10 with congruent affect. Effexor will be increased from 75 mg to 150 mg starting tomorrow morning 02/17/24. Thought process was coherent and goal oriented towards improving  mental health. She expressed interest in "[place]" stating this could help demonstrate to the courts her suitability to regain custody of children. Debbie Mueller did endorse somewhat paranoid ideation stating she things "someone's talking about me when I'm not around." She continued to associate this with family "calling me slow" referencing her cognitive ability. Her judgement and insight is present and intact. Debbie Mueller denies SI/HI or AVH. Reports that her appetite is good. She slept 7.5 hours last night. Debbie Mueller denies adverse effects to current medication regimen.   Principal Problem: MDD (major depressive disorder), recurrent severe, without psychosis (HCC) Diagnosis: Principal Problem:   MDD (major depressive disorder), recurrent severe, without psychosis (HCC) Active Problems:   Tobacco use disorder   Generalized anxiety disorder   PTSD (post-traumatic stress disorder)  Total Time spent with patient: 45 minutes  Past Psychiatric History: See H&P.   Past Medical History:  Past Medical History:  Diagnosis Date   Acid reflux    Anxiety    Anxiety disorder 2015   B12 deficiency 02/16/2024   Depression    PP depression   Folate deficiency 02/16/2024   Loss of teeth due to gum disease    wears dentures   Overactive bladder    Panic attacks 2015   PTSD (post-traumatic stress disorder) 2015   Scoliosis    Vitamin D deficiency 02/16/2024    Past Surgical History:  Procedure Laterality Date   CESAREAN SECTION     TUBAL LIGATION Bilateral 04/30/2018   Procedure: POST PARTUM TUBAL LIGATION;  Surgeon: Catalina Antigua, MD;  Location: [facility];  Service: Gynecology;  Laterality: Bilateral;   Family History:  Family History  Problem Relation Age  of Onset   Diabetes Paternal Grandfather    Hypertension Paternal Grandfather    Diabetes Paternal Grandmother    Hypertension Paternal Grandmother    Heart attack Maternal Grandmother    Heart attack Maternal Grandfather     Anemia Father    Hypertension Father    Breast cancer Mother    Depression Brother    Other Son        acid reflux   Family Psychiatric  History: See H&P.  Social History:  Social History   Substance and Sexual Activity  Alcohol Use Not Currently   Comment: reports drinking 2 standard drinks 2-3x weekly     Social History   Substance and Sexual Activity  Drug Use Not Currently    Social History   Socioeconomic History   Marital status: Single    Spouse name: Not on file   Number of children: 2   Years of education: Not on file   Highest education level: Not on file  Occupational History   Not on file  Tobacco Use   Smoking status: Every Day    Current packs/day: 0.01    Average packs/day: (0.1 ttl pk-yrs)    Types: Cigarettes   Smokeless tobacco: Never   Tobacco comments:    Pt wants to quit but having difficulty  Vaping Use   Vaping status: Never Used  Substance and Sexual Activity   Alcohol use: Not Currently    Comment: reports drinking 2 standard drinks 2-3x weekly   Drug use: Not Currently   Sexual activity: Yes    Birth control/protection: Surgical    Comment: tubal  Other Topics Concern   Not on file  Social History Narrative   Pt recently evicted from her housing. Living in hotels for last 2 weeks. Receives SSI. Lost custody of children 13 months ago. Once child in foster care and other living with relatives.   Social Drivers of Corporate investment banker Strain: Not on file  Food Insecurity: Food Insecurity Present (02/12/2024)   Hunger Vital Sign    Worried About Running Out of Food in the Last Year: Often true    Ran Out of Food in the Last Year: Often true  Transportation Needs: Unmet Transportation Needs (02/12/2024)   PRAPARE - Administrator, Civil Service (Medical): Yes    Lack of Transportation (Non-Medical): Yes  Physical Activity: Not on file  Stress: Not on file  Social Connections: Not on file   Additional Social  History:    Sleep: Good  Appetite:  Good  Current Medications: Current Facility-Administered Medications  Medication Dose Route Frequency Provider Last Rate Last Admin   acetaminophen (TYLENOL) tablet 650 mg  650 mg Oral Q6H PRN Starleen Blue, NP   650 mg at 02/12/24 0041   alum & mag hydroxide-simeth (MAALOX/MYLANTA) 200-200-20 MG/5ML suspension 30 mL  30 mL Oral Q4H PRN Starleen Blue, NP       cyanocobalamin (VITAMIN B12) tablet 1,000 mcg  1,000 mcg Oral Daily Golda Acre, MD   1,000 mcg at 02/16/24 0745   haloperidol lactate (HALDOL) injection 10 mg  10 mg Intramuscular TID PRN Starleen Blue, NP       And   diphenhydrAMINE (BENADRYL) injection 50 mg  50 mg Intramuscular TID PRN Starleen Blue, NP       And   LORazepam (ATIVAN) injection 2 mg  2 mg Intramuscular TID PRN Starleen Blue, NP       haloperidol lactate (HALDOL)  injection 5 mg  5 mg Intramuscular TID PRN Starleen Blue, NP       And   diphenhydrAMINE (BENADRYL) injection 50 mg  50 mg Intramuscular TID PRN Starleen Blue, NP       And   LORazepam (ATIVAN) injection 2 mg  2 mg Intramuscular TID PRN Starleen Blue, NP       ferrous sulfate tablet 325 mg  325 mg Oral Q breakfast Peterson Ao, MD   325 mg at 02/16/24 0745   folic acid (FOLVITE) tablet 1 mg  1 mg Oral Daily Golda Acre, MD   1 mg at 02/16/24 1031   hydrOXYzine (ATARAX) tablet 25 mg  25 mg Oral TID PRN Starleen Blue, NP   25 mg at 02/14/24 2111   loratadine (CLARITIN) tablet 10 mg  10 mg Oral Daily Peterson Ao, MD   10 mg at 02/16/24 0745   magnesium hydroxide (MILK OF MAGNESIA) suspension 30 mL  30 mL Oral Daily PRN Starleen Blue, NP       melatonin tablet 5 mg  5 mg Oral QHS Peterson Ao, MD   5 mg at 02/15/24 2125   nicotine (NICODERM CQ - dosed in mg/24 hours) patch 14 mg  14 mg Transdermal Daily Peterson Ao, MD   14 mg at 02/16/24 0745   OLANZapine zydis (ZYPREXA) disintegrating tablet 5 mg  5 mg Oral TID PRN Starleen Blue, NP        pantoprazole (PROTONIX) EC tablet 40 mg  40 mg Oral Daily Peterson Ao, MD   40 mg at 02/16/24 0745   prazosin (MINIPRESS) capsule 1 mg  1 mg Oral QHS Peterson Ao, MD   1 mg at 02/15/24 2125   QUEtiapine (SEROQUEL) tablet 50 mg  50 mg Oral QHS Izediuno, Delight Ovens, MD   50 mg at 02/15/24 2125   venlafaxine XR (EFFEXOR-XR) 24 hr capsule 75 mg  75 mg Oral Q breakfast Peterson Ao, MD   75 mg at 02/16/24 0745   Vitamin D (Ergocalciferol) (DRISDOL) 1.25 MG (50000 UNIT) capsule 50,000 Units  50,000 Units Oral Q7 days Golda Acre, MD   50,000 Units at 02/16/24 1031   vitamin D3 (CHOLECALCIFEROL) tablet 2,000 Units  2,000 Units Oral BID Golda Acre, MD   2,000 Units at 02/16/24 1031   Lab Results:  Results for orders placed or performed during the hospital encounter of 02/11/24 (from the past 48 hours)  Folate     Status: Abnormal   Collection Time: 02/15/24  6:56 PM  Result Value Ref Range   Folate 4.2 (L) >5.9 ng/mL    Comment: Performed at [facility], [address], [place], [place] [address]   Blood Alcohol level:  Lab Results  Component Value Date   ETH <10 02/11/2024   ETH <10 04/29/2023   Metabolic Disorder Labs: Lab Results  Component Value Date   HGBA1C 5.3 02/11/2024   MPG 105.41 02/11/2024   MPG 111 05/01/2023   No results found for: "PROLACTIN" Lab Results  Component Value Date   CHOL 172 02/11/2024   TRIG 204 (H) 02/11/2024   HDL 37 (L) 02/11/2024   CHOLHDL 4.6 02/11/2024   VLDL 41 (H) 02/11/2024   LDLCALC 94 02/11/2024   LDLCALC 101 (H) 07/15/2022   Physical Findings: AIMS:  , ,  ,  ,    CIWA:    COWS:     Musculoskeletal: Strength & Muscle Tone: within normal limits Gait & Station: normal  Patient leans: N/A  Psychiatric Specialty Exam:  Presentation  General Appearance:  Appropriate for Environment; Casual; Fairly Groomed  Eye Contact: Fair  Speech: Clear and Coherent  Speech  Volume: Normal  Handedness: Right  Mood and Affect  Mood: Anxious; Depressed  Affect: Congruent  Thought Process  Thought Processes: Coherent; Goal Directed  Descriptions of Associations:Intact  Orientation:Full (Time, Place and Person)  Thought Content:Paranoid Ideation  History of Schizophrenia/Schizoaffective disorder:No  Duration of Psychotic Symptoms:N/A  Hallucinations:Hallucinations: None  Ideas of Reference:Paranoia  Suicidal Thoughts:Suicidal Thoughts: No  Homicidal Thoughts:Homicidal Thoughts: No  Sensorium  Memory: Immediate Good; Recent Good  Judgment: Intact  Insight: Present  Executive Functions  Concentration: Good  Attention Span: Good  Recall: Good  Fund of Knowledge: Fair  Language: Good  Psychomotor Activity  Psychomotor Activity: Psychomotor Activity: Normal  Assets  Assets: Communication Skills; Desire for Improvement; Resilience  Sleep  Sleep: Sleep: Good Number of Hours of Sleep: 7.5  Physical Exam: Physical Exam Vitals and nursing note reviewed.  Constitutional:      General: She is not in acute distress.    Appearance: Normal appearance. She is not ill-appearing, toxic-appearing or diaphoretic.  HENT:     Head: Normocephalic and atraumatic.     Right Ear: External ear normal.     Left Ear: External ear normal.     Nose: Nose normal. No rhinorrhea.     Mouth/Throat:     Mouth: Mucous membranes are moist.     Pharynx: Oropharynx is clear.  Eyes:     General:        Right eye: No discharge.        Left eye: No discharge.     Extraocular Movements: Extraocular movements intact.     Pupils: Pupils are equal, round, and reactive to light.  Cardiovascular:     Rate and Rhythm: Normal rate.     Pulses: Normal pulses.  Pulmonary:     Effort: Pulmonary effort is normal. No respiratory distress.  Abdominal:     Comments: Deferred  Genitourinary:    Comments: Deferred Musculoskeletal:        General:  No deformity or signs of injury. Normal range of motion.     Cervical back: Normal range of motion. No rigidity.  Skin:    Coloration: Skin is not jaundiced or pale.  Neurological:     General: No focal deficit present.     Mental Status: She is alert and oriented to person, place, and time.     Motor: No weakness.     Coordination: Coordination normal.     Gait: Gait normal.  Psychiatric:        Attention and Perception: Attention and perception normal. She is attentive. She does not perceive auditory or visual hallucinations.        Mood and Affect: Mood is anxious and depressed. Mood is not elated. Affect is not angry or tearful.        Speech: Speech normal. She is communicative. Speech is not rapid and pressured or slurred.        Behavior: Behavior is withdrawn. Behavior is not agitated, aggressive or combative. Behavior is cooperative.        Thought Content: Thought content is paranoid. Thought content is not delusional. Thought content does not include homicidal or suicidal ideation. Thought content does not include homicidal or suicidal plan.        Cognition and Memory: Cognition normal.        Judgment: Judgment  is not impulsive or inappropriate.   Review of Systems  Constitutional:  Negative for chills, diaphoresis and fever.  HENT:  Negative for congestion, ear discharge, hearing loss and nosebleeds.   Eyes:  Negative for photophobia, discharge and redness.  Respiratory:  Negative for cough, hemoptysis and shortness of breath.   Cardiovascular:  Negative for chest pain and palpitations.  Gastrointestinal:  Negative for abdominal pain, heartburn, nausea and vomiting.  Genitourinary:  Negative for dysuria, frequency and urgency.  Musculoskeletal:  Negative for falls and myalgias.  Skin:  Negative for itching.  Neurological:  Negative for dizziness, sensory change, speech change, focal weakness and loss of consciousness.  Endo/Heme/Allergies:        See allergy list    Psychiatric/Behavioral:  Positive for depression. Negative for hallucinations and suicidal ideas. The patient is nervous/anxious. The patient does not have insomnia.    Blood pressure 114/78, pulse 94, temperature 98.8 F (37.1 C), temperature source Oral, resp. rate 16, height 5\' 1"  (1.549 m), weight 125.4 kg, last menstrual period 02/05/2024, SpO2 100%. Body mass index is 52.23 kg/m.   Treatment Plan Summary: Debbie Mueller is a 43 y.o. female with a past psychiatric history of SUD, MDD, GAD, PTSD who [facility] on 3/13 with worsening depression in the setting psychosocial stressors. She was voluntarily  admitted to the [facility] on 3/14 for medication management and psychiatric symptom stabilization. Stressors includes: Lost custody of her two children, has no contact with one of them. Recently evicted and was staying in motels until she ran out of funds.   1. Continue quetiapine to 50 mg at bedtime. 2.  Increase venlafaxine from XL 75 mg to 150 mg p.o. daily for depression and anxiety. 3. Continue prazosin 1 mg at bedtime. 4. Continue to monitor mood behavior and interaction with others. 5. Continue to encourage unit groups and therapeutic activities. 6. Social worker will coordinate discharge and aftercare planning. 7. Continue prn agitation protocol as ordered. 8. Continue Tylenol 650mg  po q6h prn pain 9. Continue Maalox 30ml po q4h prn  10. Continue Vit B12 1,057mcg po every day 11. Continue Ferrous Suldate 325mg  po qam  12. Continue Continue Folic Acid 1mg  po every day 13. Continue Atarax 25mg  po tid prn anxiety  14. Continue Claritin 10mg  po every day 15. Continue Magnesium Hydroxide 30ml po every day prn constipation 16. Continue Melatonin 5mg  po at bedtime  17. Continue Nicotine patch 14mg  every day 18. Continue Zyprexa 5mg  po tid prn mild agitation  19. Continue Protonix 40mg  po every day 20. Continue Vit D 1.25mg  po  q7d 21. Continue Vit D3 2,000 units po bid   Alan Mulder, FNP & Arn Medal, NP-Student  02/16/2024, 12:17 PM

## 2024-02-16 NOTE — Group Note (Signed)
 Recreation Therapy Group Note   Group Topic:Animal Assisted Therapy   Group Date: 02/16/2024 Start Time: 0945 End Time: 1030 Facilitators: Debbie Mueller, LRT,CTRS Location: [address] Dayroom   Animal-Assisted Activity (AAA) Program Checklist/Progress Notes Patient Eligibility Criteria Checklist & Daily Group note for Rec Tx Intervention  AAA/T Program Assumption of Risk Form signed by Patient/ or Parent Legal Guardian Yes  Patient is free of allergies or severe asthma Yes  Patient reports no fear of animals Yes  Patient reports no history of cruelty to animals Yes  Patient understands his/her participation is voluntary Yes  Patient washes hands before animal contact Yes  Patient washes hands after animal contact Yes  Education: Hand Washing, Appropriate Animal Interaction   Education Outcome: Acknowledges education.    Affect/Mood: Appropriate   Participation Level: Engaged   Participation Quality: Independent   Behavior: Appropriate   Speech/Thought Process: Focused   Insight: Good   Judgement: Good   Modes of Intervention: Teaching laboratory technician   Patient Response to Interventions:  Engaged   Education Outcome:  In group clarification offered    Clinical Observations/Individualized Feedback: Patient attended session and interacted appropriately with therapy dog and peers. Patient asked appropriate questions about therapy dog and his training. Patient shared stories about their pets at home with group.      Plan: Continue to engage patient in RT group sessions 2-3x/week.   Debbie Mueller, LRT,CTRS 02/16/2024 1:39 PM

## 2024-02-16 NOTE — Plan of Care (Signed)
   Problem: Education: Goal: Emotional status will improve Outcome: Progressing Goal: Mental status will improve Outcome: Progressing Goal: Verbalization of understanding the information provided will improve Outcome: Progressing

## 2024-02-16 NOTE — Plan of Care (Signed)
   Problem: Education: Goal: Emotional status will improve Outcome: Progressing Goal: Mental status will improve Outcome: Progressing

## 2024-02-16 NOTE — Progress Notes (Signed)
   02/16/24 0745  Psych Admission Type (Psych Patients Only)  Admission Status Voluntary  Psychosocial Assessment  Patient Complaints Anxiety;Depression  Eye Contact Fair  Facial Expression Animated  Affect Appropriate to circumstance  Speech Logical/coherent  Interaction Assertive  Motor Activity Other (Comment) (WDL)  Appearance/Hygiene Unremarkable  Behavior Characteristics Cooperative  Mood Pleasant  Thought Process  Coherency WDL  Content WDL  Delusions None reported or observed  Perception WDL  Hallucination None reported or observed  Judgment Poor  Confusion None  Danger to Self  Current suicidal ideation? Denies  Agreement Not to Harm Self Yes  Description of Agreement Verbal  Danger to Others  Danger to Others None reported or observed

## 2024-02-16 NOTE — Group Note (Signed)
 Date:  02/16/2024 Time:  10:45 PM  Group Topic/Focus:  Wrap-Up Group:   The focus of this group is to help patients review their daily goal of treatment and discuss progress on daily workbooks.    Participation Level:  Active  Participation Quality:  Appropriate and Sharing  Affect:  Appropriate  Cognitive:  Appropriate  Insight: Appropriate  Engagement in Group:  Engaged  Modes of Intervention:  Discussion and Socialization  Additional Comments:  Patient's used "Wrap-Up Group sheets". Patient rated her day a 7 out of 10. Patient goal for today was "get back on medication, take medication, find housing, group support, classes to prove to the court I'm a fit parent" and patient achieved, "part of her goals". Patient shared coping skills that she finds most help is, "breathing exercises, walking, journaling, talking to someone and listening to music". Something the patient likes about herself is, "my eye, my heart". Patient did not have any questions and/or concerns.  Kennieth Francois 02/16/2024, 10:45 PM

## 2024-02-16 NOTE — Plan of Care (Signed)
°  Problem: Education: Goal: Emotional status will improve Outcome: Progressing Goal: Mental status will improve Outcome: Progressing   Problem: Activity: Goal: Interest or engagement in activities will improve Outcome: Progressing   Problem: Coping: Goal: Ability to verbalize frustrations and anger appropriately will improve Outcome: Progressing   Problem: Safety: Goal: Periods of time without injury will increase Outcome: Progressing

## 2024-02-16 NOTE — Progress Notes (Signed)
   02/16/24 0000  Psych Admission Type (Psych Patients Only)  Admission Status Voluntary  Psychosocial Assessment  Patient Complaints Depression  Eye Contact Brief  Facial Expression Flat  Affect Appropriate to circumstance  Speech Logical/coherent  Interaction Cautious;Guarded  Motor Activity Other (Comment) (appropriate)  Appearance/Hygiene Unremarkable  Behavior Characteristics Appropriate to situation  Mood Pleasant  Thought Process  Coherency WDL  Content WDL  Delusions None reported or observed  Perception WDL  Hallucination None reported or observed  Judgment WDL  Confusion None  Danger to Self  Current suicidal ideation? Denies  Agreement Not to Harm Self Yes  Description of Agreement verbal  Danger to Others  Danger to Others None reported or observed

## 2024-02-16 NOTE — Group Note (Signed)
 LCSW Group Therapy Note   Group Date: 02/16/2024 Start Time: 1100 End Time: 1200   Participation:  patient was present and actively participated in the discussion.  She shared per personal experiences.  Type of Therapy:  Group Therapy   Topic:  Money Matters: Creating Stability, Confidence and Peace of Mind  Objective: To help participants understand the impact of financial stability on well-being through the lens of Maslow's Hierarchy of Needs and develop practical strategies for budgeting, saving, and debt repayment.  Goals: Increase awareness of spending habits and financial priorities, recognizing how money supports basic needs, security, and relationships. Develop simple budgeting and saving strategies to enhance stability and peace of mind.  Reduce financial stress by creating a realistic debt repayment plan, supporting long-term confidence and well-being.  Summary:  Participants explored how financial stability connects to basic needs, relationships, and self-esteem using Maslow's Hierarchy. They discussed budgeting, saving, and debt repayment strategies, identifying small, manageable changes. Through interactive discussion and self-reflection, they gained insight into their financial habits and created personal action steps for improvement.  Therapeutic Modalities Used: Elements of Cognitive Behavioral Therapy (CBT) - Addressing financial stress and thought patterns. Psychoeducation - Engineer, agricultural. Elements of Motivational Interviewing (MI) - Encouraging realistic, achievable changes. Group Support - Reducing shame and stress through shared experiences.   Alla Feeling, LCSWA 02/16/2024  5:56 PM

## 2024-02-17 ENCOUNTER — Encounter (HOSPITAL_COMMUNITY): Payer: Self-pay

## 2024-02-17 DIAGNOSIS — F332 Major depressive disorder, recurrent severe without psychotic features: Secondary | ICD-10-CM | POA: Diagnosis not present

## 2024-02-17 NOTE — Plan of Care (Addendum)
 Pt A & O X4 with fair eye contact, logical speech, depressed and tearful on interactions. Denies SI, HI, AVH and pain when assessed. Rates her depression 6/10 and anxiety 7/10 with current stressor being homelessness and missing her children. Per pt "I can't see my kids, I lost the hotel room where I was staying and now family took my kids. I haven't done any drugs in about 6 years". Pt is requesting counseling for trauma. Reports she's hopeful that her mood will improved since her Effexor was increased. Pt attended scheduled groups, engaged appropriately. Tolerated meals and fluids well. Safety maintained at Q 15 minutes intervals without outburst. Support, encouragement and reassurance offered.   Problem: Coping: Goal: Ability to demonstrate self-control will improve Outcome: Progressing   Problem: Physical Regulation: Goal: Ability to maintain clinical measurements within normal limits will improve Outcome: Progressing   Problem: Safety: Goal: Periods of time without injury will increase Outcome: Progressing

## 2024-02-17 NOTE — Progress Notes (Signed)
 [facility] MD Progress Note  02/17/2024 12:17 PM Debbie Mueller  MRN:  [id number]  Subjective: Debbie Mueller is a 43 y.o. female with a past psychiatric history of SUD, MDD, GAD, PTSD who [facility] on 3/13 with worsening depression in the setting psychosocial stressors. She was voluntarily  admitted to the [facility] on 3/14 for medication management and psychiatric symptom stabilization. Stressors includes: Lost custody of her two children, has no contact with one of them. Recently evicted and was staying in motels until she ran out of funds.   Chart reviewed today. Patient discussed at multidisciplinary team meeting.  Nursing staff reports that patient slept for 7.5 hours. Her most recent blood pressure 114/78 pulse 94. No challenging behavior. She has been participating with unit groups and therapeutic activities. [name] has taken all prescribed medications in the last 24 hours, has not required in prn medications in same timeframe.   Yesterday's psychiatric team recommendation: 1. Continue quetiapine to 50 mg at bedtime. 2. Increase Venlafaxine from XL 75 mg to 150 mg p.o. daily for depression and anxiety. 3. Continue prazosin 1 mg at bedtime. 4. Continue to monitor mood behavior and interaction with others. 5. Continue to encourage unit groups and therapeutic activities. 6. Social worker will coordinate discharge and aftercare planning.  Today's assessment notes: During assessment today, [name] was seen and examined face to face in provider office sitting upright in chair without acute distress. She presented alert and oriented to self, time, place and situation. Her appearance was casual, and states, "actually I feel good today."  Able to maintain good eye contact with this provider, and speech clear, coherent with normal rate and volume.  She reports anxiety as # 6/10 and depression as # 6/10, with 10 being high severity.  Affect is  congruent. Effexor is increased from 75 mg to 150 mg starting today 02/17/24. Thought process is coherent and goal oriented towards improving mental health. She expressed interest in "[place]" stating this could help demonstrate to the courts her suitability to regain custody of children. [name] endorses  paranoid ideation, stating she thinks "someone's talking about me when I'm not around." She continues to associate this with family "calling me slow" with regards to her cognitive ability. Her judgement and insight is present and intact. [name] denies SI/HI or AVH. Reports that her appetite is good. She slept 7.5 hours last night. [name] denies adverse effects to current medication regimen.   Principal Problem: MDD (major depressive disorder), recurrent severe, without psychosis (HCC) Diagnosis: Principal Problem:   MDD (major depressive disorder), recurrent severe, without psychosis (HCC) Active Problems:   Tobacco use disorder   Generalized anxiety disorder   PTSD (post-traumatic stress disorder)  Total Time spent with patient: 45 minutes  Past Psychiatric History: See H&P.   Past Medical History:  Past Medical History:  Diagnosis Date   Acid reflux    Anxiety    Anxiety disorder 2015   B12 deficiency 02/16/2024   Depression    PP depression   Folate deficiency 02/16/2024   Loss of teeth due to gum disease    wears dentures   Overactive bladder    Panic attacks 2015   PTSD (post-traumatic stress disorder) 2015   Scoliosis    Vitamin D deficiency 02/16/2024    Past Surgical History:  Procedure Laterality Date   CESAREAN SECTION     TUBAL LIGATION Bilateral 04/30/2018   Procedure: POST PARTUM TUBAL LIGATION;  Surgeon: Constant,  Gigi Gin, MD;  Location: [facility];  Service: Gynecology;  Laterality: Bilateral;   Family History:  Family History  Problem Relation Age of Onset   Diabetes Paternal Grandfather    Hypertension Paternal Grandfather    Diabetes Paternal  Grandmother    Hypertension Paternal Grandmother    Heart attack Maternal Grandmother    Heart attack Maternal Grandfather    Anemia Father    Hypertension Father    Breast cancer Mother    Depression Brother    Other Son        acid reflux   Family Psychiatric  History: See H&P.  Social History:  Social History   Substance and Sexual Activity  Alcohol Use Not Currently   Comment: reports drinking 2 standard drinks 2-3x weekly     Social History   Substance and Sexual Activity  Drug Use Not Currently    Social History   Socioeconomic History   Marital status: Single    Spouse name: Not on file   Number of children: 2   Years of education: Not on file   Highest education level: Not on file  Occupational History   Not on file  Tobacco Use   Smoking status: Every Day    Current packs/day: 0.01    Average packs/day: (0.1 ttl pk-yrs)    Types: Cigarettes   Smokeless tobacco: Never   Tobacco comments:    Pt wants to quit but having difficulty  Vaping Use   Vaping status: Never Used  Substance and Sexual Activity   Alcohol use: Not Currently    Comment: reports drinking 2 standard drinks 2-3x weekly   Drug use: Not Currently   Sexual activity: Yes    Birth control/protection: Surgical    Comment: tubal  Other Topics Concern   Not on file  Social History Narrative   Pt recently evicted from her housing. Living in hotels for last 2 weeks. Receives SSI. Lost custody of children 13 months ago. Once child in foster care and other living with relatives.   Social Drivers of Corporate investment banker Strain: Not on file  Food Insecurity: Food Insecurity Present (02/12/2024)   Hunger Vital Sign    Worried About Running Out of Food in the Last Year: Often true    Ran Out of Food in the Last Year: Often true  Transportation Needs: Unmet Transportation Needs (02/12/2024)   PRAPARE - Administrator, Civil Service (Medical): Yes    Lack of Transportation  (Non-Medical): Yes  Physical Activity: Not on file  Stress: Not on file  Social Connections: Not on file   Additional Social History:    Sleep: Good  Appetite:  Good  Current Medications: Current Facility-Administered Medications  Medication Dose Route Frequency Provider Last Rate Last Admin   acetaminophen (TYLENOL) tablet 650 mg  650 mg Oral Q6H PRN Starleen Blue, NP   650 mg at 02/12/24 0041   alum & mag hydroxide-simeth (MAALOX/MYLANTA) 200-200-20 MG/5ML suspension 30 mL  30 mL Oral Q4H PRN Nkwenti, Tyler Aas, NP       cyanocobalamin (VITAMIN B12) tablet 1,000 mcg  1,000 mcg Oral Daily Golda Acre, MD   1,000 mcg at 02/17/24 0913   haloperidol lactate (HALDOL) injection 10 mg  10 mg Intramuscular TID PRN Starleen Blue, NP       And   diphenhydrAMINE (BENADRYL) injection 50 mg  50 mg Intramuscular TID PRN Starleen Blue, NP       And  LORazepam (ATIVAN) injection 2 mg  2 mg Intramuscular TID PRN Starleen Blue, NP       haloperidol lactate (HALDOL) injection 5 mg  5 mg Intramuscular TID PRN Starleen Blue, NP       And   diphenhydrAMINE (BENADRYL) injection 50 mg  50 mg Intramuscular TID PRN Starleen Blue, NP       And   LORazepam (ATIVAN) injection 2 mg  2 mg Intramuscular TID PRN Starleen Blue, NP       ferrous sulfate tablet 325 mg  325 mg Oral Q breakfast Peterson Ao, MD   325 mg at 02/17/24 0913   folic acid (FOLVITE) tablet 1 mg  1 mg Oral Daily Golda Acre, MD   1 mg at 02/17/24 8119   hydrOXYzine (ATARAX) tablet 25 mg  25 mg Oral TID PRN Starleen Blue, NP   25 mg at 02/14/24 2111   loratadine (CLARITIN) tablet 10 mg  10 mg Oral Daily Peterson Ao, MD   10 mg at 02/17/24 0913   magnesium hydroxide (MILK OF MAGNESIA) suspension 30 mL  30 mL Oral Daily PRN Starleen Blue, NP       melatonin tablet 5 mg  5 mg Oral QHS Peterson Ao, MD   5 mg at 02/16/24 2111   nicotine (NICODERM CQ - dosed in mg/24 hours) patch 14 mg  14 mg Transdermal Daily Peterson Ao, MD   14 mg at 02/17/24 0912   OLANZapine zydis (ZYPREXA) disintegrating tablet 5 mg  5 mg Oral TID PRN Starleen Blue, NP       pantoprazole (PROTONIX) EC tablet 40 mg  40 mg Oral Daily Peterson Ao, MD   40 mg at 02/17/24 0913   prazosin (MINIPRESS) capsule 1 mg  1 mg Oral QHS Peterson Ao, MD   1 mg at 02/16/24 2111   QUEtiapine (SEROQUEL) tablet 50 mg  50 mg Oral QHS Izediuno, Delight Ovens, MD   50 mg at 02/16/24 2111   venlafaxine XR (EFFEXOR-XR) 24 hr capsule 150 mg  150 mg Oral Q breakfast Kandie Keiper, Jesusita Oka, FNP   150 mg at 02/17/24 0913   Vitamin D (Ergocalciferol) (DRISDOL) 1.25 MG (50000 UNIT) capsule 50,000 Units  50,000 Units Oral Q7 days Golda Acre, MD   50,000 Units at 02/16/24 1031   vitamin D3 (CHOLECALCIFEROL) tablet 2,000 Units  2,000 Units Oral BID Golda Acre, MD   2,000 Units at 02/17/24 1478   Lab Results:  Results for orders placed or performed during the hospital encounter of 02/11/24 (from the past 48 hours)  Folate     Status: Abnormal   Collection Time: 02/15/24  6:56 PM  Result Value Ref Range   Folate 4.2 (L) >5.9 ng/mL    Comment: Performed at [facility], [address] [place], [place] [address]   Blood Alcohol level:  Lab Results  Component Value Date   [facility] <10 02/11/2024   ETH <10 04/29/2023   Metabolic Disorder Labs: Lab Results  Component Value Date   HGBA1C 5.3 02/11/2024   MPG 105.41 02/11/2024   MPG 111 05/01/2023   No results found for: "PROLACTIN" Lab Results  Component Value Date   CHOL 172 02/11/2024   TRIG 204 (H) 02/11/2024   HDL 37 (L) 02/11/2024   CHOLHDL 4.6 02/11/2024   VLDL 41 (H) 02/11/2024   LDLCALC 94 02/11/2024   LDLCALC 101 (H) 07/15/2022   Physical Findings: AIMS:  , ,  ,  ,  CIWA:    COWS:     Musculoskeletal: Strength & Muscle Tone: within normal limits Gait & Station: normal Patient leans: N/A  Psychiatric Specialty Exam:  Presentation  General Appearance:   Appropriate for Environment; Casual  Eye Contact: Good  Speech: Clear and Coherent  Speech Volume: Normal  Handedness: Right  Mood and Affect  Mood: Anxious; Depressed  Affect: Appropriate; Congruent  Thought Process  Thought Processes: Coherent  Descriptions of Associations:Intact  Orientation:Full (Time, Place and Person)  Thought Content:Logical; Paranoid Ideation  History of Schizophrenia/Schizoaffective disorder:No  Duration of Psychotic Symptoms:N/A  Hallucinations:Hallucinations: None  Ideas of Reference:Paranoia  Suicidal Thoughts:Suicidal Thoughts: No  Homicidal Thoughts:Homicidal Thoughts: No  Sensorium  Memory: Immediate Good; Recent Good  Judgment: Intact  Insight: Present  Executive Functions  Concentration: Good  Attention Span: Good  Recall: Good  Fund of Knowledge: Fair  Language: Good  Psychomotor Activity  Psychomotor Activity: Psychomotor Activity: Normal  Assets  Assets: Communication Skills; Desire for Improvement; Physical Health; Resilience  Sleep  Sleep: Sleep: Good Number of Hours of Sleep: 7.5  Physical Exam: Physical Exam Vitals and nursing note reviewed.  Constitutional:      General: She is not in acute distress.    Appearance: Normal appearance. She is obese. She is not ill-appearing, toxic-appearing or diaphoretic.  HENT:     Head: Normocephalic and atraumatic.     Right Ear: External ear normal.     Left Ear: External ear normal.     Nose: Nose normal. No rhinorrhea.     Mouth/Throat:     Mouth: Mucous membranes are moist.     Pharynx: Oropharynx is clear.  Eyes:     General:        Right eye: No discharge.        Left eye: No discharge.     Extraocular Movements: Extraocular movements intact.     Pupils: Pupils are equal, round, and reactive to light.  Cardiovascular:     Rate and Rhythm: Normal rate.     Pulses: Normal pulses.  Pulmonary:     Effort: Pulmonary effort is  normal. No respiratory distress.  Abdominal:     Comments: Deferred  Genitourinary:    Comments: Deferred Musculoskeletal:        General: No deformity or signs of injury. Normal range of motion.     Cervical back: Normal range of motion. No rigidity.  Skin:    Coloration: Skin is not jaundiced or pale.  Neurological:     General: No focal deficit present.     Mental Status: She is alert and oriented to person, place, and time.     Motor: No weakness.     Coordination: Coordination normal.     Gait: Gait normal.  Psychiatric:        Attention and Perception: Attention and perception normal. She is attentive. She does not perceive auditory or visual hallucinations.        Mood and Affect: Mood is anxious and depressed. Mood is not elated. Affect is not angry or tearful.        Speech: Speech normal. She is communicative. Speech is not rapid and pressured or slurred.        Behavior: Behavior is withdrawn. Behavior is not agitated, aggressive or combative. Behavior is cooperative.        Thought Content: Thought content is paranoid. Thought content is not delusional. Thought content does not include homicidal or suicidal ideation. Thought content does not include homicidal  or suicidal plan.        Cognition and Memory: Cognition normal.        Judgment: Judgment is not impulsive or inappropriate.   Review of Systems  Constitutional:  Negative for chills, diaphoresis and fever.  HENT:  Negative for congestion, ear discharge, hearing loss and nosebleeds.   Eyes:  Negative for photophobia, discharge and redness.  Respiratory:  Negative for cough, hemoptysis and shortness of breath.   Cardiovascular:  Negative for chest pain and palpitations.  Gastrointestinal:  Negative for abdominal pain, heartburn, nausea and vomiting.  Genitourinary:  Negative for dysuria, frequency and urgency.  Musculoskeletal:  Negative for falls and myalgias.  Skin:  Negative for itching.  Neurological:  Negative  for dizziness, sensory change, speech change, focal weakness and loss of consciousness.  Endo/Heme/Allergies:        See allergy list   Psychiatric/Behavioral:  Positive for depression. Negative for hallucinations and suicidal ideas. The patient is nervous/anxious. The patient does not have insomnia.    Blood pressure 103/85, pulse 94, temperature 98.9 F (37.2 C), temperature source Oral, resp. rate 18, height 5\' 1"  (1.549 m), weight 125.4 kg, last menstrual period 02/05/2024, SpO2 100%. Body mass index is 52.23 kg/m.   Treatment Plan Summary: KILAH DRAHOS is a 43 y.o. female with a past psychiatric history of SUD, MDD, GAD, PTSD who presented to the [facility] on 3/13 with worsening depression in the setting psychosocial stressors. She was voluntarily  admitted to the [facility] on 3/14 for medication management and psychiatric symptom stabilization. Stressors includes: Lost custody of her two children, has no contact with one of them. Recently evicted and was staying in motels until she ran out of funds.   1. Continue quetiapine to 50 mg at bedtime. 2. Continue Venlafaxine 150 mg p.o. daily for depression and anxiety. 3. Continue prazosin 1 mg at bedtime. 4. Continue to monitor mood behavior and interaction with others. 5. Continue to encourage unit groups and therapeutic activities. 6. Social worker will coordinate discharge and aftercare planning. 7. Continue prn agitation protocol as ordered. 8. Continue Tylenol 650mg  po q6h prn pain 9. Continue Maalox 30ml po q4h prn  10. Continue Vit B12 1,067mcg po every day 11. Continue Ferrous Suldate 325mg  po qam  12. Continue Continue Folic Acid 1mg  po every day 13. Continue Atarax 25mg  po tid prn anxiety  14. Continue Claritin 10mg  po every day 15. Continue Magnesium Hydroxide 30ml po every day prn constipation 16. Continue Melatonin 5mg  po at bedtime  17. Continue Nicotine patch 14mg  every day 18.  Continue Zyprexa 5mg  po tid prn mild agitation  19. Continue Protonix 40mg  po every day 20. Continue Vit D 1.25mg  po q7d 21. Continue Vit D3 2,000 units po bid   Alan Mulder, FNP & Arn Medal, NP-Student  02/17/2024, 12:17 PM Patient ID: [name], female   DOB: 1981/06/12, 43 y.o.   MRN: [id number]

## 2024-02-17 NOTE — Group Note (Signed)
 Recreation Therapy Group Note   Group Topic:Other  Group Date: 02/17/2024 Start Time: 1405 End Time: 1450 Facilitators: Crystale Giannattasio-McCall, LRT,CTRS Location: [address] Dayroom   Activity Description/Intervention: Therapeutic Drumming. Patients with peers and staff were given the opportunity to engage in a leader facilitated [facility] Group Empowerment Drumming Circle with staff from the [employer], in partnership with [employer]. Teaching laboratory technician and trained [employer], Theodoro Doing leading with LRT observing and documenting intervention and pt response. This evidenced-based practice targets 7 areas of health and wellbeing in the human experience including: stress-reduction, exercise, self-expression, camaraderie/support, nurturing, spirituality, and music-making (leisure).   Goal Area(s) Addresses:  Patient will engage in pro-social way in music group.  Patient will follow directions of drum leader on the first prompt. Patient will demonstrate no behavioral issues during group.  Patient will identify if a reduction in stress level occurs as a result of participation in therapeutic drum circle.    Education: Leisure exposure, Pharmacologist, Musical expression, Discharge Planning   Affect/Mood: Appropriate   Participation Level: Engaged   Participation Quality: Independent   Behavior: Appropriate   Speech/Thought Process: Focused   Insight: Good   Judgement: Good   Modes of Intervention: Teaching laboratory technician   Patient Response to Interventions:  Engaged   Education Outcome:  In group clarification offered    Clinical Observations/Individualized Feedback: Debbie Mueller actively engaged in therapeutic drumming exercise and discussions. Pt was appropriate with peers, staff, and musical equipment for duration of programming.  Pt identified "relaxed" as their feeling after participation in music-based programming. Pt affect congruent/incongruent with  verbalized emotion.    Plan: Continue to engage patient in RT group sessions 2-3x/week.   Debbie Mueller, LRT,CTRS 02/17/2024 4:02 PM

## 2024-02-17 NOTE — BH IP Treatment Plan (Signed)
 Interdisciplinary Treatment and Diagnostic Plan Update  02/17/2024 Time of Session: 1210 - update Debbie Mueller MRN: [id number]  Principal Diagnosis: MDD (major depressive disorder), recurrent severe, without psychosis (HCC)  Secondary Diagnoses: Principal Problem:   MDD (major depressive disorder), recurrent severe, without psychosis (HCC) Active Problems:   Tobacco use disorder   Generalized anxiety disorder   PTSD (post-traumatic stress disorder)   Current Medications:  Current Facility-Administered Medications  Medication Dose Route Frequency Provider Last Rate Last Admin   acetaminophen (TYLENOL) tablet 650 mg  650 mg Oral Q6H PRN Starleen Blue, NP   650 mg at 02/12/24 0041   alum & mag hydroxide-simeth (MAALOX/MYLANTA) 200-200-20 MG/5ML suspension 30 mL  30 mL Oral Q4H PRN Starleen Blue, NP       cyanocobalamin (VITAMIN B12) tablet 1,000 mcg  1,000 mcg Oral Daily Golda Acre, MD   1,000 mcg at 02/17/24 0913   haloperidol lactate (HALDOL) injection 10 mg  10 mg Intramuscular TID PRN Starleen Blue, NP       And   diphenhydrAMINE (BENADRYL) injection 50 mg  50 mg Intramuscular TID PRN Starleen Blue, NP       And   LORazepam (ATIVAN) injection 2 mg  2 mg Intramuscular TID PRN Starleen Blue, NP       haloperidol lactate (HALDOL) injection 5 mg  5 mg Intramuscular TID PRN Starleen Blue, NP       And   diphenhydrAMINE (BENADRYL) injection 50 mg  50 mg Intramuscular TID PRN Starleen Blue, NP       And   LORazepam (ATIVAN) injection 2 mg  2 mg Intramuscular TID PRN Starleen Blue, NP       ferrous sulfate tablet 325 mg  325 mg Oral Q breakfast Peterson Ao, MD   325 mg at 02/17/24 0913   folic acid (FOLVITE) tablet 1 mg  1 mg Oral Daily Golda Acre, MD   1 mg at 02/17/24 0913   hydrOXYzine (ATARAX) tablet 25 mg  25 mg Oral TID PRN Starleen Blue, NP   25 mg at 02/14/24 2111   loratadine (CLARITIN) tablet 10 mg  10 mg Oral Daily Peterson Ao, MD   10 mg at  02/17/24 0913   magnesium hydroxide (MILK OF MAGNESIA) suspension 30 mL  30 mL Oral Daily PRN Starleen Blue, NP       melatonin tablet 5 mg  5 mg Oral QHS Peterson Ao, MD   5 mg at 02/16/24 2111   nicotine (NICODERM CQ - dosed in mg/24 hours) patch 14 mg  14 mg Transdermal Daily Peterson Ao, MD   14 mg at 02/17/24 0912   OLANZapine zydis (ZYPREXA) disintegrating tablet 5 mg  5 mg Oral TID PRN Starleen Blue, NP       pantoprazole (PROTONIX) EC tablet 40 mg  40 mg Oral Daily Peterson Ao, MD   40 mg at 02/17/24 0913   prazosin (MINIPRESS) capsule 1 mg  1 mg Oral QHS Peterson Ao, MD   1 mg at 02/16/24 2111   QUEtiapine (SEROQUEL) tablet 50 mg  50 mg Oral QHS Izediuno, Delight Ovens, MD   50 mg at 02/16/24 2111   venlafaxine XR (EFFEXOR-XR) 24 hr capsule 150 mg  150 mg Oral Q breakfast Ntuen, Jesusita Oka, FNP   150 mg at 02/17/24 0913   Vitamin D (Ergocalciferol) (DRISDOL) 1.25 MG (50000 UNIT) capsule 50,000 Units  50,000 Units Oral Q7 days Golda Acre, MD   50,000 Units at 02/16/24  1031   vitamin D3 (CHOLECALCIFEROL) tablet 2,000 Units  2,000 Units Oral BID Golda Acre, MD   2,000 Units at 02/17/24 0912   PTA Medications: Medications Prior to Admission  Medication Sig Dispense Refill Last Dose/Taking   acetaminophen (TYLENOL) 500 MG tablet Take 1,000 mg by mouth every 6 (six) hours as needed for moderate pain or headache.      cetirizine (ZYRTEC) 10 MG tablet Take 1 tablet (10 mg total) by mouth daily. 30 tablet 0    cholecalciferol (VITAMIN D3) 25 MCG (1000 UNIT) tablet Take 1,000 Units by mouth daily.      ferrous sulfate 325 (65 FE) MG tablet Take 325 mg by mouth daily with breakfast.      fluticasone (FLONASE) 50 MCG/ACT nasal spray Place 2 sprays into both nostrils daily. (Patient not taking: Reported on 02/11/2024) 16 g 0    venlafaxine XR (EFFEXOR-XR) 150 MG 24 hr capsule Take 1 capsule (150 mg total) by mouth daily with breakfast. (Patient not taking: Reported on 02/11/2024) 30  capsule 0     Patient Stressors: Financial difficulties   Loss of pt lost custody of kids last year; evicted recently   Marital or family conflict    Patient Strengths: Average or above average intelligence  Capable of independent living  Motivation for treatment/growth   Treatment Modalities: Medication Management, Group therapy, Case management,  1 to 1 session with clinician, Psychoeducation, Recreational therapy.   Physician Treatment Plan for Primary Diagnosis: MDD (major depressive disorder), recurrent severe, without psychosis (HCC) Long Term Goal(s):     Short Term Goals:    Medication Management: Evaluate patient's response, side effects, and tolerance of medication regimen.  Therapeutic Interventions: 1 to 1 sessions, Unit Group sessions and Medication administration.  Evaluation of Outcomes: Progressing  Physician Treatment Plan for Secondary Diagnosis: Principal Problem:   MDD (major depressive disorder), recurrent severe, without psychosis (HCC) Active Problems:   Tobacco use disorder   Generalized anxiety disorder   PTSD (post-traumatic stress disorder)  Long Term Goal(s):     Short Term Goals:       Medication Management: Evaluate patient's response, side effects, and tolerance of medication regimen.  Therapeutic Interventions: 1 to 1 sessions, Unit Group sessions and Medication administration.  Evaluation of Outcomes: Progressing   RN Treatment Plan for Primary Diagnosis: MDD (major depressive disorder), recurrent severe, without psychosis (HCC) Long Term Goal(s): Knowledge of disease and therapeutic regimen to maintain health will improve  Short Term Goals: Ability to verbalize frustration and anger appropriately will improve, Ability to demonstrate self-control, Ability to participate in decision making will improve, Ability to disclose and discuss suicidal ideas, and Ability to identify and develop effective coping behaviors will improve  Medication  Management: RN will administer medications as ordered by provider, will assess and evaluate patient's response and provide education to patient for prescribed medication. RN will report any adverse and/or side effects to prescribing provider.  Therapeutic Interventions: 1 on 1 counseling sessions, Psychoeducation, Medication administration, Evaluate responses to treatment, Monitor vital signs and CBGs as ordered, Perform/monitor CIWA, COWS, AIMS and Fall Risk screenings as ordered, Perform wound care treatments as ordered.  Evaluation of Outcomes: Progressing   LCSW Treatment Plan for Primary Diagnosis: MDD (major depressive disorder), recurrent severe, without psychosis (HCC) Long Term Goal(s): Safe transition to appropriate next level of care at discharge, Engage patient in therapeutic group addressing interpersonal concerns.  Short Term Goals: Engage patient in aftercare planning with referrals and resources, Increase emotional regulation,  Facilitate acceptance of mental health diagnosis and concerns, Facilitate patient progression through stages of change regarding substance use diagnoses and concerns, and Identify triggers associated with mental health/substance abuse issues  Therapeutic Interventions: Assess for all discharge needs, 1 to 1 time with Social worker, Explore available resources and support systems, Assess for adequacy in community support network, Educate family and significant other(s) on suicide prevention, Complete Psychosocial Assessment, Interpersonal group therapy.  Evaluation of Outcomes: Progressing   Progress in Treatment: Attending groups: Yes. Participating in groups: Yes. Taking medication as prescribed: Yes. Toleration medication: Yes. Family/Significant other contact made: No, will contact: Renae Wall (relative) [phone]  Jiles Garter (dad) [phone] Patient understands diagnosis: Yes. Discussing patient identified problems/goals with staff: Yes. Medical  problems stabilized or resolved: Yes. Denies suicidal/homicidal ideation: Yes. Issues/concerns per patient self-inventory: No.   New problem(s) identified:  No   New Short Term/Long Term Goal(s):    medication stabilization, elimination of SI thoughts, development of comprehensive mental wellness plan.    Patient Goals:  "I want to get help with trauma, anxiety and depression."  Patient's main stressors are homelessness and the loss of custody of her children.  Patient lost her children due to financial difficulties.   Discharge Plan or Barriers:  Patient recently admitted. CSW will continue to follow and assess for appropriate referrals and possible discharge planning.    Reason for Continuation of Hospitalization: Anxiety Depression Medication stabilization   Estimated Length of Stay:  2-3 days  Last 3 [place] Suicide Severity Risk Score: Flowsheet Row [facility] (Current[facility] Most recent reading at 02/12/2024 12:00 AM ED from 02/11/2024 in [facility] Most recent reading at 02/11/2024  3:29 PM ED from 07/04/2023 in [facility] [place] Most recent reading at 07/04/2023  8:34 AM  C-SSRS RISK CATEGORY No Risk Moderate Risk No Risk       Last PHQ 2/9 Scores:    09/24/2022    8:13 AM 07/09/2022   10:31 AM 10/28/2017   10:39 AM  Depression screen PHQ 2/9  Decreased Interest 1 0 1  Down, Depressed, Hopeless 0 0 1  PHQ - 2 Score 1 0 2  Altered sleeping   0  Tired, decreased energy   1  Change in appetite   0  Feeling bad or failure about yourself    1  Trouble concentrating   0  Moving slowly or fidgety/restless   0  Suicidal thoughts   0  PHQ-9 Score   4  Difficult doing work/chores   Somewhat difficult    Scribe for Treatment Team: Kathi Der, Theresia Majors 02/17/2024 1:57 PM
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
## 2024-02-17 NOTE — BHH Group Notes (Signed)
 Group Topic: Activities that bring joy/memories  Goals: Reignite passions that may be significant sources of hope/possibility, reawaken memories that we hold dear that may also be part of healing grief, consider new traditions/values and reflect on what is "spiritual" about doing things that bring [place] joy, fulfillment, and peace.  Theoretical basis: All group dynamics rooted in group psychotherapeutic approaches of Chyrl Civatte, also Rogerian and Relational Cultural Therapeutic concepts that aim to foster relational support, mutual empathy, and unconditional positive regard/acceptance.  Observations: Debbie Mueller was an active participant in the discussion.  [name] L. [name], M.Div [phone]

## 2024-02-17 NOTE — Progress Notes (Signed)
   02/17/24 2300  Psych Admission Type (Psych Patients Only)  Admission Status Voluntary  Psychosocial Assessment  Patient Complaints Anxiety  Eye Contact Fair  Facial Expression Animated  Affect Appropriate to circumstance  Speech Logical/coherent  Interaction Assertive  Motor Activity Slow  Appearance/Hygiene Unremarkable  Behavior Characteristics Cooperative;Appropriate to situation  Thought Process  Coherency WDL  Content WDL  Delusions None reported or observed  Perception WDL  Hallucination None reported or observed  Judgment WDL  Confusion None  Danger to Self  Current suicidal ideation? Denies  Description of Suicide Plan no plan  Self-Injurious Behavior No self-injurious ideation or behavior indicators observed or expressed   Agreement Not to Harm Self Yes  Description of Agreement verbal  Danger to Others  Danger to Others None reported or observed

## 2024-02-17 NOTE — Group Note (Signed)
 Date:  02/17/2024 Time:  12:55 PM  Group Topic/Focus:  Dimensions of Wellness:   The focus of this group is to introduce the topic of physical wellness and discuss its impact on mental health.     Participation Level:  Active  Participation Quality:  Appropriate  Affect:  Appropriate  Cognitive:  Appropriate  Insight: Appropriate  Engagement in Group:  Engaged  Modes of Intervention:  Discussion and Education  Additional Comments:    Arnoldo Hooker 02/17/2024, 12:55 PM

## 2024-02-17 NOTE — Plan of Care (Signed)

## 2024-02-17 NOTE — BHH Group Notes (Signed)
 BHH Group Notes:  (Nursing/MHT/Case Management/Adjunct)  Date:  02/17/2024  Time:  8:32 PM  Type of Therapy:   NA Group  Participation Level:  Active  Participation Quality:  Appropriate  Affect:  Appropriate  Cognitive:  Appropriate  Insight:  Appropriate  Engagement in Group:  Engaged  Modes of Intervention:  Education  Summary of Progress/Problems: Attended NA meeting.  Debbie Mueller 02/17/2024, 8:32 PM

## 2024-02-17 NOTE — Group Note (Signed)
 Date:  02/17/2024 Time:  10:13 AM  Group Topic/Focus:  Goals Group:   The focus of this group is to help patients establish daily goals to achieve during treatment and discuss how the patient can incorporate goal setting into their daily lives to aide in recovery. Orientation:   The focus of this group is to educate the patient on the purpose and policies of crisis stabilization and provide a format to answer questions about their admission.  The group details unit policies and expectations of patients while admitted.    Participation Level:  Active  Participation Quality:  Appropriate  Affect:  Appropriate  Cognitive:  Appropriate  Insight: Appropriate  Engagement in Group:  Engaged  Modes of Intervention:  Discussion  Additional Comments:    Debbie Mueller Debbie Mueller 02/17/2024, 10:13 AM

## 2024-02-18 DIAGNOSIS — F332 Major depressive disorder, recurrent severe without psychotic features: Secondary | ICD-10-CM | POA: Diagnosis not present

## 2024-02-18 MED ORDER — QUETIAPINE FUMARATE 50 MG PO TABS
50.0000 mg | ORAL_TABLET | Freq: Every day | ORAL | 0 refills | Status: DC
Start: 2024-02-18 — End: 2024-04-21

## 2024-02-18 MED ORDER — PANTOPRAZOLE SODIUM 40 MG PO TBEC: 40.0000 mg | DELAYED_RELEASE_TABLET | Freq: Every day | ORAL | 0 refills | Status: DC

## 2024-02-18 MED ORDER — NICOTINE 14 MG/24HR TD PT24: 14.0000 mg | MEDICATED_PATCH | Freq: Every day | TRANSDERMAL | 0 refills | Status: DC

## 2024-02-18 MED ORDER — CYANOCOBALAMIN 1000 MCG PO TABS: 1000.0000 ug | ORAL_TABLET | Freq: Every day | ORAL | 0 refills | Status: DC

## 2024-02-18 MED ORDER — VITAMIN D3 25 MCG PO TABS: 2000.0000 [IU] | ORAL_TABLET | Freq: Two times a day (BID) | ORAL | 0 refills | Status: AC

## 2024-02-18 MED ORDER — VITAMIN D (ERGOCALCIFEROL) 1.25 MG (50000 UNIT) PO CAPS
50000.0000 [IU] | ORAL_CAPSULE | ORAL | 0 refills | Status: AC
Start: 2024-02-23 — End: ?

## 2024-02-18 MED ORDER — FOLIC ACID 1 MG PO TABS
1.0000 mg | ORAL_TABLET | Freq: Every day | ORAL | 0 refills | Status: DC
Start: 2024-02-19 — End: 2024-04-21

## 2024-02-18 MED ORDER — LORATADINE 10 MG PO TABS: 10.0000 mg | ORAL_TABLET | Freq: Every day | ORAL | 0 refills | Status: DC

## 2024-02-18 MED ORDER — VENLAFAXINE HCL ER 150 MG PO CP24: 150.0000 mg | ORAL_CAPSULE | Freq: Every day | ORAL | 0 refills | Status: DC

## 2024-02-18 MED ORDER — PRAZOSIN HCL 1 MG PO CAPS: 1.0000 mg | ORAL_CAPSULE | Freq: Every day | ORAL | 0 refills | Status: AC

## 2024-02-18 MED ORDER — HYDROXYZINE HCL 25 MG PO TABS: 25.0000 mg | ORAL_TABLET | Freq: Three times a day (TID) | ORAL | 0 refills | Status: DC | PRN

## 2024-02-18 MED ORDER — MELATONIN 5 MG PO TABS: 5.0000 mg | ORAL_TABLET | Freq: Every day | ORAL | 0 refills | Status: DC

## 2024-02-18 NOTE — BHH Suicide Risk Assessment (Signed)
 Suicide Risk Assessment  Discharge Assessment    [facility] Discharge Suicide Risk Assessment   Principal Problem: MDD (major depressive disorder), recurrent severe, without psychosis (HCC) Discharge Diagnoses: Principal Problem:   MDD (major depressive disorder), recurrent severe, without psychosis (HCC) Active Problems:   Tobacco use disorder   Generalized anxiety disorder   PTSD (post-traumatic stress disorder)  Reason for admission:  Debbie Mueller is a 43 y.o. female with a past psychiatric history of MDD, GAD, PTSD who [facility] on [facility]13 with worsening depression in the setting psychosocial stressors. She was voluntarily  admitted to the [facility] on 3/14 for medication management and psychiatric symptom stabilization.   Total Time spent with patient: 45 minutes  Musculoskeletal: Strength & Muscle Tone: within normal limits Gait & Station: normal Patient leans: N/A  Psychiatric Specialty Exam  Presentation  General Appearance:  Appropriate for Environment; Casual; Fairly Groomed  Eye Contact: Good  Speech: Clear and Coherent; Normal Rate  Speech Volume: Normal  Handedness: Right  Mood and Affect  Mood: -- (Improving)  Duration of Depression Symptoms: Greater than two weeks  Affect: Congruent  Thought Process  Thought Processes: Coherent  Descriptions of Associations:Intact  Orientation:Full (Time, Place and Person)  Thought Content:Logical  History of Schizophrenia/Schizoaffective disorder:No  Duration of Psychotic Symptoms:N/A  Hallucinations:Hallucinations: None  Ideas of Reference:Paranoia  Suicidal Thoughts:Suicidal Thoughts: No  Homicidal Thoughts:Homicidal Thoughts: No  Sensorium  Memory: Immediate Good; Recent Good  Judgment: Intact  Insight: Present  Executive Functions  Concentration: Good  Attention Span: Good  Recall: Good  Fund of  Knowledge: Fair  Language: Good  Psychomotor Activity  Psychomotor Activity: Psychomotor Activity: Normal  Assets  Assets: Communication Skills; Physical Health; Resilience  Sleep  Sleep: Sleep: Good Number of Hours of Sleep: 7.75  Physical Exam: Physical Exam Vitals and nursing note reviewed.  Constitutional:      Appearance: Normal appearance.  HENT:     Head: Normocephalic.     Nose: Nose normal.     Mouth/Throat:     Mouth: Mucous membranes are moist.     Pharynx: Oropharynx is clear.  Eyes:     Extraocular Movements: Extraocular movements intact.  Cardiovascular:     Rate and Rhythm: Normal rate.     Pulses: Normal pulses.  Pulmonary:     Effort: Pulmonary effort is normal.  Abdominal:     Comments: Deferred  Genitourinary:    Comments: Deferred Musculoskeletal:        General: Normal range of motion.     Cervical back: Normal range of motion.  Skin:    General: Skin is warm.  Neurological:     General: No focal deficit present.     Mental Status: She is alert and oriented to person, place, and time.  Psychiatric:        Mood and Affect: Mood normal.        Behavior: Behavior normal.        Thought Content: Thought content normal.   Review of Systems  Constitutional:  Negative for chills and fever.  HENT:  Negative for sore throat.   Eyes:  Negative for blurred vision.  Respiratory:  Negative for cough, sputum production, shortness of breath and wheezing.   Cardiovascular:  Negative for chest pain and palpitations.  Gastrointestinal:  Negative for abdominal pain, constipation, diarrhea, heartburn, nausea and vomiting.  Genitourinary:  Negative for dysuria, frequency and urgency.  Musculoskeletal:  Negative for myalgias.  Skin:  Negative for itching and rash.  Neurological:  Negative for dizziness, tingling and headaches.  Endo/Heme/Allergies:        See allergy listing  Psychiatric/Behavioral:  Positive for depression (Improved with medication).  Negative for hallucinations, substance abuse and suicidal ideas. The patient is nervous/anxious (Improved with medication). The patient does not have insomnia.    Blood pressure 107/72, pulse 93, temperature 98.7 F (37.1 C), temperature source Oral, resp. rate 18, height 5\' 1"  (1.549 m), weight 125.4 kg, last menstrual period 02/05/2024, SpO2 100%. Body mass index is 52.23 kg/m.  Mental Status Per Nursing Assessment::   On Admission:  NA  Demographic Factors:  Caucasian, Low socioeconomic status, and Unemployed  Loss Factors: Loss of significant relationship and Financial problems/change in socioeconomic status  Historical Factors: Prior suicide attempts and Impulsivity  Risk Reduction Factors:   Responsible for children under 82 years of age, Sense of responsibility to family, Living with another person, especially a relative, Positive social support, Positive therapeutic relationship, and Positive coping skills or problem solving skills  Continued Clinical Symptoms:  Depression:   Hopelessness Impulsivity Recent sense of peace/wellbeing More than one psychiatric diagnosis Unstable or Poor Therapeutic Relationship Previous Psychiatric Diagnoses and Treatments  Cognitive Features That Contribute To Risk:  Polarized thinking    Suicide Risk:  Mild: There are no identifiable plans, no associated intent, mild dysphoria and related symptoms, good self-control (both objective and subjective assessment), few other risk factors, and identifiable protective factors, including available and accessible social support.   Follow-up Information     Services, [facility]. Go on 02/19/2024.   Why: You have a hospital follow up appointment to obtain medication management and group therapy services on  02/19/24 at 10:00 am. The appointment will be held in person. Contact information: [address] [place] [address] [phone]                Plan Of Care/Follow-up  recommendations:  Discharge Recommendations:  The patient is being discharged to home with her mother. Patient is to take her discharge medications as ordered.  See follow up above. We recommend that she participates in individual therapy to target uncontrollable agitation and substance abuse.  We recommend that she participates in therapy to target the conflict with her family, to improve communication skills and conflict resolution skills.  patient is to initiate/implement a contingency based behavioral model to address patient's behavior. We recommend that she gets AIMS scale, height, weight, blood pressure, fasting lipid panel, fasting blood sugar in three months from discharge if she's on atypical antipsychotics.  Patient will benefit from monitoring of recurrent suicidal ideation since patient is on antidepressant medication. The patient should abstain from all illicit substances and alcohol. If the patient's symptoms worsen or do not continue to improve or if the patient becomes actively suicidal or homicidal then it is recommended that the patient return to the closest hospital emergency room or call 911 for further evaluation and treatment. National Suicide Prevention Lifeline 1800-SUICIDE or [phone]. Please follow up with your primary medical doctor for all other medical needs.  The patient has been educated on the possible side effects to medications and she/her guardian is to contact a medical professional and inform outpatient provider of any new side effects of medication. She is to take regular diet and activity as tolerated.  Will benefit from moderate daily exercise. Patient was educated about removing/locking any firearms, medications or dangerous products from the home.  Activity:  As tolerated Diet:  Regular Diet   Cecilie Lowers, FNP 02/18/2024, 11:57 AM

## 2024-02-18 NOTE — Progress Notes (Addendum)
 CSW spoke with Pt on the unit regarding today's discharge. Made her aware that CSW spoke with Renae. CSW made Emmagrace privy conversation, specifically the possibility that she may be able to discharge to mother's home. Assisted Bita in determining how she was going to make her decision. Addyson agreed to make any necessary phone  calls to Renae and/or her mother. CSW and Shizue to reconvene at 1130 to discuss her final decision. Also made Evani aware that Renae has offered to provide transportation at discharge.   1400 Spoke with Pt on the unit and she has decided to discharge home w/ her mother. Mother will be providing transport. Rheannon states that she notifed Renae.  1408 CSW reached out to Renae to make her aware of Sharica's decision.   Joelyn Oms [place], LCSW 02/18/24 10:51 AM

## 2024-02-18 NOTE — Discharge Summary (Signed)
 Physician Discharge Summary Note  Patient:  Debbie Mueller is an 43 y.o., female MRN:  [id number] DOB:  Oct 14, 1981 Patient phone:  [phone] (home)  Patient address:   [address] [place] [address],  Total Time spent with patient: 45 minutes  Date of Admission:  02/11/2024 Date of Discharge:   02/18/2024  Reason for Admission:  Debbie Mueller is a 43 y.o. female with a past psychiatric history of MDD, GAD, PTSD who [facility] on 3/13 with worsening depression in the setting psychosocial stressors. She was voluntarily  admitted to the [facility] on 3/14 for medication management and psychiatric symptom stabilization.    Principal Problem: MDD (major depressive disorder), recurrent severe, without psychosis (HCC) Discharge Diagnoses: Principal Problem:   MDD (major depressive disorder), recurrent severe, without psychosis (HCC) Active Problems:   Tobacco use disorder   Generalized anxiety disorder   PTSD (post-traumatic stress disorder)   Past Psychiatric HistoryPrevious psych diagnoses:  MDD, GAD, PTSD Prior inpatient psychiatric treatment: Multiple hospitalizations, most recent 5/30-05/06/2023 for worsening SI w/ plan. [facility] 2/7-10/2019 SI attempt via OD on antidepressant meds  Prior outpatient psychiatric treatment:  [facility]  Current psychiatric provider: Dr. Geanie Cooley    Neuromodulation history: denies   Current therapist:   patient unsure  Psychotherapy hx:  [facility] previously   History of suicide attempts:  x2 OD in 2020 and slit wrist in 2010  History of homicide: Denies   Psychotropic medications: Current Venlafaxine 150 mg daily - patient not taking  Prazosin 1 mg at bedtime - patient not taking    Past Trazodone, Propanolol, Paxil, Hydroxyzine, Lexapro, Depakote, Celexa, Buspar,    Past Medical History:  Past Medical History:  Diagnosis Date   Acid reflux    Anxiety    Anxiety disorder 2015    B12 deficiency 02/16/2024   Depression    PP depression   Folate deficiency 02/16/2024   Loss of teeth due to gum disease    wears dentures   Overactive bladder    Panic attacks 2015   PTSD (post-traumatic stress disorder) 2015   Scoliosis    Vitamin D deficiency 02/16/2024    Past Surgical History:  Procedure Laterality Date   CESAREAN SECTION     TUBAL LIGATION Bilateral 04/30/2018   Procedure: POST PARTUM TUBAL LIGATION;  Surgeon: Catalina Antigua, MD;  Location: [facility];  Service: Gynecology;  Laterality: Bilateral;   Family History:  Family History  Problem Relation Age of Onset   Diabetes Paternal Grandfather    Hypertension Paternal Grandfather    Diabetes Paternal Grandmother    Hypertension Paternal Grandmother    Heart attack Maternal Grandmother    Heart attack Maternal Grandfather    Anemia Father    Hypertension Father    Breast cancer Mother    Depression Brother    Other Son        acid reflux   Family Psychiatric  History: See H&P Social History:  Social History   Substance and Sexual Activity  Alcohol Use Not Currently   Comment: reports drinking 2 standard drinks 2-3x weekly     Social History   Substance and Sexual Activity  Drug Use Not Currently    Social History   Socioeconomic History   Marital status: Single    Spouse name: Not on file   Number of children: 2   Years of education: Not on file   Highest education level: Not on file  Occupational History   Not on file  Tobacco Use   Smoking status: Every Day    Current packs/day: 0.01    Average packs/day: (0.1 ttl pk-yrs)    Types: Cigarettes   Smokeless tobacco: Never   Tobacco comments:    Pt wants to quit but having difficulty  Vaping Use   Vaping status: Never Used  Substance and Sexual Activity   Alcohol use: Not Currently    Comment: reports drinking 2 standard drinks 2-3x weekly   Drug use: Not Currently   Sexual activity: Yes    Birth control/protection:  Surgical    Comment: tubal  Other Topics Concern   Not on file  Social History Narrative   Pt recently evicted from her housing. Living in hotels for last 2 weeks. Receives SSI. Lost custody of children 13 months ago. Once child in foster care and other living with relatives.   Social Drivers of Corporate investment banker Strain: Not on file  Food Insecurity: Food Insecurity Present (02/12/2024)   Hunger Vital Sign    Worried About Running Out of Food in the Last Year: Often true    Ran Out of Food in the Last Year: Often true  Transportation Needs: Unmet Transportation Needs (02/12/2024)   PRAPARE - Administrator, Civil Service (Medical): Yes    Lack of Transportation (Non-Medical): Yes  Physical Activity: Not on file  Stress: Not on file  Social Connections: Not on file   Hospital Course:  During the patient's hospitalization, patient had extensive initial psychiatric evaluation, and follow-up psychiatric evaluations every day.  Initial psychiatric diagnoses during admission: Principal Problem:   MDD (major depressive disorder), recurrent severe, without psychosis (HCC) Active Problems:   Tobacco use disorder   Generalized anxiety disorder   PTSD (post-traumatic stress disorder)  Patient's psychiatric medications were adjusted on admission:   -- Start low dose Effexor 37.5 mg daily, MDD/GAD, will gradually increase to therapeutic effect, previously on 150 mg daily             -- Start Prazosin 1 mg at bedtime for PTSD -- Start Seroquel 25 mg at bedtime for adjunct of MDD/GAD and insomnia   -- Start Melatonin 3 mg at bedtime for insomnia               -- medical regimen:  -- Start Protonix 40 mg daily for GERD  -- Start Vitamin D 1,000 units supplementation for Vitamin D Deficiency -- Start Claritin 10 mg for seasonal allergies  -- Restart Home Iron Ferrous sulfate 325 mg for IDA, recent CBC stable                -- Patient in need of nicotine replacement;  nicotine patch 14 mg / 24 hours ordered. Smoking cessation encouraged  During the hospitalization, other adjustments were made to the patient's psychiatric medication regimen:  --Paxil was titrated to 150 mg p.o. daily for depression -- Seroquel was increased to 50 mg p.o. daily nightly for adjunct MDD/GAD and insomnia  Patient's care was discussed during the interdisciplinary team meeting every day during the hospitalization.  The patient denies having side effects to prescribed psychiatric medication.  Gradually, patient started adjusting to milieu. The patient was evaluated each day by a clinical provider to ascertain response to treatment. Improvement was noted by the patient's report of decreasing symptoms, improved sleep and appetite, affect, medication tolerance, behavior, and participation in unit programming.  Patient was asked each day to complete  a self inventory noting mood, mental status, pain, new symptoms, anxiety and concerns.    Symptoms were reported as significantly decreased or resolved completely by discharge.   On day of discharge, the patient reports that their mood is stable. The patient denied having suicidal thoughts for more than 48 hours prior to discharge.  Patient denies having homicidal thoughts.  Patient denies having auditory hallucinations.  Patient denies any visual hallucinations or other symptoms of psychosis. The patient was motivated to continue taking medication with a goal of continued improvement in mental health.   The patient reports their target psychiatric symptoms of depression responded well to the psychiatric medications, and the patient reports overall benefit other psychiatric hospitalization. Supportive psychotherapy was provided to the patient. The patient also participated in regular group therapy while hospitalized. Coping skills, problem solving as well as relaxation therapies were also part of the unit programming.  Labs were reviewed with  the patient, and abnormal results were discussed with the patient.  The patient is able to verbalize their individual safety plan to this provider.  # It is recommended to the patient to continue psychiatric medications as prescribed, after discharge from the hospital.    # It is recommended to the patient to follow up with your outpatient psychiatric provider and PCP.  # It was discussed with the patient, the impact of alcohol, drugs, tobacco have been there overall psychiatric and medical wellbeing, and total abstinence from substance use was recommended the patient.ed.  # Prescriptions provided or sent directly to preferred pharmacy at discharge. Patient agreeable to plan. Given opportunity to ask questions. Appears to feel comfortable with discharge.    # In the event of worsening symptoms, the patient is instructed to call the crisis hotline, 911 and or go to the nearest ED for appropriate evaluation and treatment of symptoms. To follow-up with primary care provider for other medical issues, concerns and or health care needs  # Patient was discharged to home with her mother with a plan to follow up as noted below.   Physical Findings: AIMS:  , ,  ,  ,    CIWA:    COWS:     Musculoskeletal: Strength & Muscle Tone: within normal limits Gait & Station: normal Patient leans: N/A  Psychiatric Specialty Exam:  Presentation  General Appearance:  Appropriate for Environment; Casual; Fairly Groomed  Eye Contact: Good  Speech: Clear and Coherent; Normal Rate  Speech Volume: Normal  Handedness: Right  Mood and Affect  Mood: -- (Improving)  Affect: Congruent  Thought Process  Thought Processes: Coherent  Descriptions of Associations:Intact  Orientation:Full (Time, Place and Person)  Thought Content:Logical  History of Schizophrenia/Schizoaffective disorder:No  Duration of Psychotic Symptoms:N/A  Hallucinations:Hallucinations: None  Ideas of  Reference:Paranoia  Suicidal Thoughts:Suicidal Thoughts: No  Homicidal Thoughts:Homicidal Thoughts: No  Sensorium  Memory: Immediate Good; Recent Good  Judgment: Intact  Insight: Present  Executive Functions  Concentration: Good  Attention Span: Good  Recall: Good  Fund of Knowledge: Fair  Language: Good  Psychomotor Activity  Psychomotor Activity: Psychomotor Activity: Normal  Assets  Assets: Communication Skills; Physical Health; Resilience  Sleep  Sleep: Sleep: Good Number of Hours of Sleep: 7.75  Physical Exam: Physical Exam Vitals and nursing note reviewed.  Constitutional:      Appearance: She is obese.  HENT:     Head: Normocephalic.     Right Ear: External ear normal.     Left Ear: External ear normal.     Nose:  Nose normal.     Mouth/Throat:     Mouth: Mucous membranes are moist.     Pharynx: Oropharynx is clear.  Eyes:     Extraocular Movements: Extraocular movements intact.  Cardiovascular:     Rate and Rhythm: Normal rate.     Pulses: Normal pulses.  Pulmonary:     Effort: Pulmonary effort is normal.  Abdominal:     Comments: Deferred  Genitourinary:    Comments: Deferred Musculoskeletal:        General: Normal range of motion.     Cervical back: Normal range of motion.  Skin:    General: Skin is warm.  Neurological:     General: No focal deficit present.     Mental Status: She is alert and oriented to person, place, and time.  Psychiatric:        Mood and Affect: Mood normal.        Behavior: Behavior normal.        Thought Content: Thought content normal.    Review of Systems  Constitutional:  Negative for chills and fever.  HENT:  Negative for sore throat.   Eyes:  Negative for blurred vision.  Respiratory:  Negative for cough, sputum production, shortness of breath and wheezing.   Cardiovascular:  Negative for chest pain and palpitations.  Gastrointestinal:  Negative for abdominal pain, constipation, diarrhea,  heartburn, nausea and vomiting.  Genitourinary:  Negative for dysuria, frequency and urgency.  Musculoskeletal:  Negative for myalgias.  Skin:  Negative for itching and rash.  Neurological:  Negative for dizziness, tingling and headaches.  Endo/Heme/Allergies:        See allergy listing  Psychiatric/Behavioral:  Positive for depression (Stable with medication). Negative for hallucinations, substance abuse and suicidal ideas. The patient is nervous/anxious (Stable with medication). The patient does not have insomnia.    Blood pressure 107/72, pulse 93, temperature 98.7 F (37.1 C), temperature source Oral, resp. rate 18, height 5\' 1"  (1.549 m), weight 125.4 kg, last menstrual period 02/05/2024, SpO2 100%. Body mass index is 52.23 kg/m.   Social History   Tobacco Use  Smoking Status Every Day   Current packs/day: 0.01   Average packs/day: (0.1 ttl pk-yrs)   Types: Cigarettes  Smokeless Tobacco Never  Tobacco Comments   Pt wants to quit but having difficulty   Tobacco Cessation:  A prescription for an FDA-approved tobacco cessation medication provided at discharge  Blood Alcohol level:  Lab Results  Component Value Date   [facility] <10 02/11/2024   ETH <10 04/29/2023    Metabolic Disorder Labs:  Lab Results  Component Value Date   HGBA1C 5.3 02/11/2024   MPG 105.41 02/11/2024   MPG 111 05/01/2023   No results found for: "PROLACTIN" Lab Results  Component Value Date   CHOL 172 02/11/2024   TRIG 204 (H) 02/11/2024   HDL 37 (L) 02/11/2024   CHOLHDL 4.6 02/11/2024   VLDL 41 (H) 02/11/2024   LDLCALC 94 02/11/2024   LDLCALC 101 (H) 07/15/2022    See Psychiatric Specialty Exam and Suicide Risk Assessment completed by Attending Physician prior to discharge.  Discharge destination:  Home  Is patient on multiple antipsychotic therapies at discharge:  No   Has Patient had three or more failed trials of antipsychotic monotherapy by history:  No  Recommended Plan for Multiple  Antipsychotic Therapies: NA  Discharge Instructions     Increase activity slowly   Complete by: As directed       Allergies as of  02/18/2024       Reactions   Penicillins Rash   Did it involve swelling of the face/tongue/throat, SOB, or low BP? Unknown Did it involve sudden or severe rash/hives, skin peeling, or any reaction on the inside of your mouth or nose? Unknown Did you need to seek medical attention at a hospital or doctor's office? Unknown When did it last happen?    Unknown   If all above answers are "NO", may proceed with cephalosporin use.        Medication List     STOP taking these medications    acetaminophen 500 MG tablet Commonly known as: TYLENOL   cetirizine 10 MG tablet Commonly known as: ZYRTEC Replaced by: loratadine 10 MG tablet   fluticasone 50 MCG/ACT nasal spray Commonly known as: FLONASE       TAKE these medications      Indication  cyanocobalamin 1000 MCG tablet Take 1 tablet (1,000 mcg total) by mouth daily. Start taking on: February 19, 2024  Indication: Inadequate Vitamin B12   ferrous sulfate 325 (65 FE) MG tablet Take 325 mg by mouth daily with breakfast.  Indication: Iron Deficiency   folic acid 1 MG tablet Commonly known as: FOLVITE Take 1 tablet (1 mg total) by mouth daily. Start taking on: February 19, 2024  Indication: Anemia From Inadequate Folic Acid   hydrOXYzine 25 MG tablet Commonly known as: ATARAX Take 1 tablet (25 mg total) by mouth 3 (three) times daily as needed for anxiety.  Indication: Feeling Anxious   loratadine 10 MG tablet Commonly known as: CLARITIN Take 1 tablet (10 mg total) by mouth daily. Start taking on: February 19, 2024 Replaces: cetirizine 10 MG tablet  Indication: Hayfever   melatonin 5 MG Tabs Take 1 tablet (5 mg total) by mouth at bedtime.  Indication: Trouble Sleeping   nicotine 14 mg/24hr patch Commonly known as: NICODERM CQ - dosed in mg/24 hours Place 1 patch (14 mg total) onto the  skin daily. Start taking on: February 19, 2024  Indication: Nicotine Addiction   pantoprazole 40 MG tablet Commonly known as: PROTONIX Take 1 tablet (40 mg total) by mouth daily. Start taking on: February 19, 2024  Indication: Gastroesophageal Reflux Disease   prazosin 1 MG capsule Commonly known as: MINIPRESS Take 1 capsule (1 mg total) by mouth at bedtime.  Indication: Frightening Dreams   QUEtiapine 50 MG tablet Commonly known as: SEROQUEL Take 1 tablet (50 mg total) by mouth at bedtime.  Indication: Major Depressive Disorder   venlafaxine XR 150 MG 24 hr capsule Commonly known as: EFFEXOR-XR Take 1 capsule (150 mg total) by mouth daily with breakfast.  Indication: Major Depressive Disorder   Vitamin D (Ergocalciferol) 1.25 MG (50000 UNIT) Caps capsule Commonly known as: DRISDOL Take 1 capsule (50,000 Units total) by mouth every 7 (seven) days. Start taking on: February 23, 2024  Indication: Vitamin D Deficiency   vitamin D3 25 MCG tablet Commonly known as: CHOLECALCIFEROL Take 2 tablets (2,000 Units total) by mouth 2 (two) times daily. What changed:  medication strength how much to take when to take this  Indication: Vitamin D Deficiency        Follow-up Information     Services, [facility]. Go on 02/19/2024.   Why: You have a hospital follow up appointment to obtain medication management and group therapy services on  02/19/24 at 10:00 am. The appointment will be held in person. Contact information: [address] [place] [address] [phone]  Follow-up recommendations:   Discharge Recommendations:  The patient is being discharged with her family. Patient is to take her discharge medications as ordered.  See follow up above. We recommend that she participates in individual therapy to target uncontrollable agitation and substance abuse.  We recommend that she participates in therapy to target the conflict with her family, to  improve communication skills and conflict resolution skills.  patient is to initiate/implement a contingency based behavioral model to address patient's behavior. We recommend that she gets AIMS scale, height, weight, blood pressure, fasting lipid panel, fasting blood sugar in three months from discharge if she's on atypical antipsychotics.  Patient will benefit from monitoring of recurrent suicidal ideation since patient is on antidepressant medication. The patient should abstain from all illicit substances and alcohol. If the patient's symptoms worsen or do not continue to improve or if the patient becomes actively suicidal or homicidal then it is recommended that the patient return to the closest hospital emergency room or call 911 for further evaluation and treatment. National Suicide Prevention Lifeline 1800-SUICIDE or 631-877-5087. Please follow up with your primary medical doctor for all other medical needs.  The patient has been educated on the possible side effects to medications and she/her guardian is to contact a medical professional and inform outpatient provider of any new side effects of medication. She is to take regular diet and activity as tolerated.  Will benefit from moderate daily exercise. Patient was educated about removing/locking any firearms, medications or dangerous products from the home.  Activity:  As tolerated Diet:  Regular Diet             Signed:  Cecilie Lowers, FNP 02/18/2024, 2:24 PM

## 2024-02-18 NOTE — Progress Notes (Signed)
 Patient verbalizes readiness for discharge. All patient belongings returned to patient. Discharge instructions read and discussed with patient (appointments, medications, resources). Patient expressed gratitude for care provided. Patient discharged to lobby where her mother was waiting.

## 2024-02-18 NOTE — [facility] Suicide Risk Assessment (Signed)
 [facility] INPATIENT:  Family/Significant Other Suicide Prevention Education  Suicide Prevention Education:  Education Completed; Paul Half - [phone] (family friend), has been identified by the patient as the family member/significant other with whom the patient will be residing, and identified as the person(s) who will aid the patient in the event of a mental health crisis (suicidal ideations/suicide attempt).  With written consent from the patient, the family member/significant other has been provided the following suicide prevention education, prior to the and/or following the discharge of the patient.  Spoke with family friend Paul Half and she is aware of Pt's hospital stay. She resides in [place], [place] next door to Pt's father. Renae shared that she has visited patient on once occasion, spoken to her on the phone x2 and dropped off items. (Important to note is that Devra is not able to stay at the home of Renae nor her father) Tamela Oddi did state that Pt's mother offered to take her in, but in speaking with Pt, Renae feels she may be hesitant due to strain in the relationship. CSW made Renae aware of the current discharge plan. She did offer to provide transportation. CSW agreed to follow-up with Herbert Seta to confirm her final plan at discharge and how she would like to be transported; CSW agreed to have Jawanda call Renae with her final decision.    The suicide prevention education provided includes the following: Suicide risk factors Suicide prevention and interventions National Suicide Hotline telephone number [facility] assessment telephone number [facility] Assistance 911 [facility] and/or Residential Mobile Crisis Unit telephone number  Request made of family/significant other to: Remove weapons (e.g., guns, rifles, knives), all items previously/currently identified as safety concern.   Remove drugs/medications (over-the-counter, prescriptions, illicit drugs),  all items previously/currently identified as a safety concern.  The family member/significant other verbalizes understanding of the suicide prevention education information provided.  The family member/significant other agrees to remove the items of safety concern listed above.  Joelyn Oms Eugene Zeiders, LCSW 02/18/2024, 10:41 AM

## 2024-02-18 NOTE — Progress Notes (Signed)
  [facility] Adult Case Management Discharge Plan :  Will you be returning to the same living situation after discharge:  No. At discharge, do you have transportation home?: Yes,  Mother to provide Do you have the ability to pay for your medications: Yes,  active Medicaid  Release of information consent forms completed and in the chart;  Patient's signature needed at discharge.  Patient to Follow up at:  Follow-up Information     [facility], [facility]. Go on 02/19/2024.   Why: You have a hospital follow up appointment to obtain medication management and group therapy services on  02/19/24 at 10:00 am. The appointment will be held in person. Contact information: [address] [place] [address] [phone]                 Next level of care provider has access to [facility] Link:no  Safety Planning and Suicide Prevention discussed: Yes,  completed with patient and Pt's family friend  Has patient been referred to the Quitline?: Patient refused referral for treatment  Patient has been referred for addiction treatment: No known substance use disorder.  Joelyn Oms Falisha Osment, LCSW 02/18/2024, 2:09 PM

## 2024-03-04 ENCOUNTER — Telehealth: Payer: Self-pay

## 2024-03-04 ENCOUNTER — Ambulatory Visit: Payer: 59 | Admitting: Nurse Practitioner

## 2024-03-04 NOTE — Telephone Encounter (Signed)
 Attempted to contact patient regarding missed appointment  Left voicemail.

## 2024-04-20 ENCOUNTER — Telehealth: Payer: Self-pay

## 2024-04-20 ENCOUNTER — Telehealth: Payer: Self-pay | Admitting: Family Medicine

## 2024-04-20 NOTE — Telephone Encounter (Signed)
 Copied from CRM [id number]. Topic: Appointments - Appointment Scheduling >> Apr 20, 2024 11:50 AM Tiffany S wrote: Patient/patient representative is calling to schedule an appointment. Refer to attachments for appointment information.   Patient is needing medicine refill for her bipolar depression disorder please follow up with patient

## 2024-04-20 NOTE — Telephone Encounter (Unsigned)
 Copied from CRM [id number]. Topic: Clinical - Medication Question >> Apr 20, 2024 11:29 AM Everlene Hobby D wrote: Patient is currently out of - venlafaxine  XR (EFFEXOR -XR) 150 MG 24 hr capsule, loratadine  (CLARITIN ) 10 MG tablet, pantoprazole  (PROTONIX ) 40 MG tablet, hydrOXYzine  (ATARAX ) 25 MG tablet, prazosin  (MINIPRESS ) 1 MG capsule, QUEtiapine  (SEROQUEL ) 50 MG tablet, folic acid  (FOLVITE ) 1 MG tablet  And wants to know if she can get a refill before her appointment in June >> Apr 20, 2024 11:36 AM Everlene Hobby D wrote:  Pharmacy is- [facility] [id number], [place], [place] [address] [phone]

## 2024-04-21 ENCOUNTER — Other Ambulatory Visit: Payer: Self-pay

## 2024-04-21 ENCOUNTER — Other Ambulatory Visit: Payer: Self-pay | Admitting: Family Medicine

## 2024-04-21 DIAGNOSIS — E538 Deficiency of other specified B group vitamins: Secondary | ICD-10-CM

## 2024-04-21 DIAGNOSIS — G4700 Insomnia, unspecified: Secondary | ICD-10-CM

## 2024-04-21 DIAGNOSIS — J301 Allergic rhinitis due to pollen: Secondary | ICD-10-CM

## 2024-04-21 DIAGNOSIS — F4310 Post-traumatic stress disorder, unspecified: Secondary | ICD-10-CM

## 2024-04-21 DIAGNOSIS — R12 Heartburn: Secondary | ICD-10-CM

## 2024-04-21 DIAGNOSIS — F32A Depression, unspecified: Secondary | ICD-10-CM

## 2024-04-21 MED ORDER — VENLAFAXINE HCL ER 150 MG PO CP24: 150.0000 mg | ORAL_CAPSULE | Freq: Every day | ORAL | 0 refills | Status: DC

## 2024-04-21 MED ORDER — LORATADINE 10 MG PO TABS: 10.0000 mg | ORAL_TABLET | Freq: Every day | ORAL | 0 refills | Status: DC

## 2024-04-21 MED ORDER — QUETIAPINE FUMARATE 50 MG PO TABS: 50.0000 mg | ORAL_TABLET | Freq: Every day | ORAL | 0 refills | Status: DC

## 2024-04-21 MED ORDER — PANTOPRAZOLE SODIUM 40 MG PO TBEC: 40.0000 mg | DELAYED_RELEASE_TABLET | Freq: Every day | ORAL | 0 refills | Status: DC

## 2024-04-21 MED ORDER — HYDROXYZINE HCL 25 MG PO TABS: 25.0000 mg | ORAL_TABLET | Freq: Three times a day (TID) | ORAL | 0 refills | Status: DC | PRN

## 2024-04-21 MED ORDER — FOLIC ACID 1 MG PO TABS
1.0000 mg | ORAL_TABLET | Freq: Every day | ORAL | 0 refills | Status: DC
Start: 2024-04-21 — End: 2024-04-21

## 2024-04-21 MED ORDER — FOLIC ACID 1 MG PO TABS
1.0000 mg | ORAL_TABLET | Freq: Every day | ORAL | 0 refills | Status: DC
Start: 2024-04-21 — End: 2024-05-23

## 2024-04-21 NOTE — Telephone Encounter (Signed)
 LVM for patient to call and schedule appt with office

## 2024-04-21 NOTE — Telephone Encounter (Signed)
 Would  you kindly update the pharmacy in the chart

## 2024-05-16 ENCOUNTER — Ambulatory Visit (INDEPENDENT_AMBULATORY_CARE_PROVIDER_SITE_OTHER): Payer: Self-pay | Admitting: Nurse Practitioner

## 2024-05-16 ENCOUNTER — Encounter: Payer: Self-pay | Admitting: Nurse Practitioner

## 2024-05-16 VITALS — BP 126/71 | HR 92 | Wt 266.8 lb

## 2024-05-16 DIAGNOSIS — G4700 Insomnia, unspecified: Secondary | ICD-10-CM

## 2024-05-16 DIAGNOSIS — F32A Depression, unspecified: Secondary | ICD-10-CM | POA: Diagnosis not present

## 2024-05-16 DIAGNOSIS — F4310 Post-traumatic stress disorder, unspecified: Secondary | ICD-10-CM

## 2024-05-16 DIAGNOSIS — R12 Heartburn: Secondary | ICD-10-CM | POA: Diagnosis not present

## 2024-05-16 DIAGNOSIS — F419 Anxiety disorder, unspecified: Secondary | ICD-10-CM | POA: Diagnosis not present

## 2024-05-16 DIAGNOSIS — Z139 Encounter for screening, unspecified: Secondary | ICD-10-CM

## 2024-05-16 MED ORDER — VENLAFAXINE HCL ER 150 MG PO CP24: 150.0000 mg | ORAL_CAPSULE | Freq: Every day | ORAL | 0 refills | Status: AC

## 2024-05-16 MED ORDER — QUETIAPINE FUMARATE 50 MG PO TABS: 50.0000 mg | ORAL_TABLET | Freq: Every day | ORAL | 0 refills | Status: AC

## 2024-05-16 MED ORDER — HYDROXYZINE HCL 25 MG PO TABS: 25.0000 mg | ORAL_TABLET | Freq: Three times a day (TID) | ORAL | 0 refills | Status: AC | PRN

## 2024-05-16 MED ORDER — FERROUS SULFATE 325 (65 FE) MG PO TABS: 325.0000 mg | ORAL_TABLET | Freq: Every day | ORAL | 2 refills | Status: AC

## 2024-05-16 MED ORDER — PANTOPRAZOLE SODIUM 40 MG PO TBEC: 40.0000 mg | DELAYED_RELEASE_TABLET | Freq: Every day | ORAL | 0 refills | Status: DC

## 2024-05-16 NOTE — Patient Instructions (Signed)
 1. Anxiety and depression (Primary)  - venlafaxine  XR (EFFEXOR -XR) 150 MG 24 hr capsule; Take 1 capsule (150 mg total) by mouth daily with breakfast.  Dispense: 30 capsule; Refill: 0 - Ambulatory referral to Psychiatry - CBC - Comprehensive metabolic panel with GFR  2. PTSD (post-traumatic stress disorder)  - hydrOXYzine  (ATARAX ) 25 MG tablet; Take 1 tablet (25 mg total) by mouth 3 (three) times daily as needed for anxiety.  Dispense: 30 tablet; Refill: 0 - venlafaxine  XR (EFFEXOR -XR) 150 MG 24 hr capsule; Take 1 capsule (150 mg total) by mouth daily with breakfast.  Dispense: 30 capsule; Refill: 0  3. Insomnia, unspecified type  - QUEtiapine  (SEROQUEL ) 50 MG tablet; Take 1 tablet (50 mg total) by mouth at bedtime.  Dispense: 30 tablet; Refill: 0  4. Heart burn  - pantoprazole  (PROTONIX ) 40 MG tablet; Take 1 tablet (40 mg total) by mouth daily.  Dispense: 30 tablet; Refill: 0

## 2024-05-16 NOTE — Progress Notes (Signed)
 Subjective   Patient ID: Debbie Mueller, female    DOB: [birth year], 43 y.o.   MRN: [id number]  Chief Complaint  Patient presents with   Medical Management of Chronic Issues    Patient stated she has been having panic attacks and need medication     Referring provider: Zarwolo, Gloria, FNP  Debbie Mueller is a 43 y.o. female with Past Medical History: No date: Acid reflux No date: Anxiety 2015: Anxiety disorder 02/16/2024: B12 deficiency No date: Depression     Comment:  PP depression 02/16/2024: Folate deficiency No date: Loss of teeth due to gum disease     Comment:  wears dentures No date: Overactive bladder 2015: Panic attacks 2015: PTSD (post-traumatic stress disorder) No date: Scoliosis No date: Substance abuse (HCC) 02/16/2024: Vitamin D  deficiency   HPI  Patient presents today for an acute visit.  Her PCP is Gloria Zarwolo.  Patient is having increased anxiety and depression.  Her children have been taken away from her and are in DSS custody.  We will place referral for social worker.  Patient does need community resources.  We will place referral to psychiatry for anxiety and depression and refill patient's medications.Denies f/c/s, n/v/d, hemoptysis, PND, leg swelling Denies chest pain or edema     Allergies  Allergen Reactions   Penicillins Rash    Did it involve swelling of the face/tongue/throat, SOB, or low BP? Unknown Did it involve sudden or severe rash/hives, skin peeling, or any reaction on the inside of your mouth or nose? Unknown Did you need to seek medical attention at a hospital or doctor's office? Unknown When did it last happen?    Unknown   If all above answers are NO, may proceed with cephalosporin use.     Immunization History  Administered Date(s) Administered   Influenza,inj,Quad PF,6+ Mos 10/28/2017, 01/09/2019   Pneumococcal Polysaccharide-23 05/01/2018   Tdap 04/30/2018    Tobacco History: Social History   Tobacco Use   Smoking Status Every Day   Current packs/day: 0.01   Average packs/day: (0.1 ttl pk-yrs)   Types: Cigarettes  Smokeless Tobacco Never  Tobacco Comments   Pt wants to quit but having difficulty   Ready to quit: Not Answered Counseling given: Yes Tobacco comments: Pt wants to quit but having difficulty   Outpatient Encounter Medications as of 05/16/2024  Medication Sig   cyanocobalamin  1000 MCG tablet Take 1 tablet (1,000 mcg total) by mouth daily.   folic acid  (FOLVITE ) 1 MG tablet Take 1 tablet (1 mg total) by mouth daily.   loratadine  (CLARITIN ) 10 MG tablet Take 1 tablet (10 mg total) by mouth daily.   melatonin 5 MG TABS Take 1 tablet (5 mg total) by mouth at bedtime.   nicotine  (NICODERM CQ  - DOSED IN MG/24 HOURS) 14 mg/24hr patch Place 1 patch (14 mg total) onto the skin daily.   prazosin  (MINIPRESS ) 1 MG capsule Take 1 capsule (1 mg total) by mouth at bedtime.   Vitamin D , Ergocalciferol , (DRISDOL ) 1.25 MG (50000 UNIT) CAPS capsule Take 1 capsule (50,000 Units total) by mouth every 7 (seven) days.   vitamin D3 (CHOLECALCIFEROL ) 25 MCG tablet Take 2 tablets (2,000 Units total) by mouth 2 (two) times daily.   [DISCONTINUED] ferrous sulfate  325 (65 FE) MG tablet Take 325 mg by mouth daily with breakfast.   [DISCONTINUED] hydrOXYzine  (ATARAX ) 25 MG tablet Take 1 tablet (25 mg total) by mouth 3 (three) times daily as needed for anxiety.   [DISCONTINUED]  pantoprazole  (PROTONIX ) 40 MG tablet Take 1 tablet (40 mg total) by mouth daily.   [DISCONTINUED] QUEtiapine  (SEROQUEL ) 50 MG tablet Take 1 tablet (50 mg total) by mouth at bedtime.   [DISCONTINUED] venlafaxine  XR (EFFEXOR -XR) 150 MG 24 hr capsule Take 1 capsule (150 mg total) by mouth daily with breakfast.   ferrous sulfate  325 (65 FE) MG tablet Take 1 tablet (325 mg total) by mouth daily with breakfast.   hydrOXYzine  (ATARAX ) 25 MG tablet Take 1 tablet (25 mg total) by mouth 3 (three) times daily as needed for anxiety.   pantoprazole   (PROTONIX ) 40 MG tablet Take 1 tablet (40 mg total) by mouth daily.   QUEtiapine  (SEROQUEL ) 50 MG tablet Take 1 tablet (50 mg total) by mouth at bedtime.   venlafaxine  XR (EFFEXOR -XR) 150 MG 24 hr capsule Take 1 capsule (150 mg total) by mouth daily with breakfast.   No facility-administered encounter medications on file as of 05/16/2024.    Review of Systems  Review of Systems  Constitutional: Negative.   HENT: Negative.    Cardiovascular: Negative.   Gastrointestinal: Negative.   Allergic/Immunologic: Negative.   Neurological: Negative.   Psychiatric/Behavioral: Negative.       Objective:   BP 126/71   Pulse 92   Wt 266 lb 12.8 oz (121 kg)   SpO2 100%   BMI 50.41 kg/m   Wt Readings from Last 5 Encounters:  05/16/24 266 lb 12.8 oz (121 kg)  04/29/23 254 lb 4.8 oz (115.3 kg)  08/13/22 287 lb (130.2 kg)  07/09/22 290 lb (131.5 kg)  07/08/20 192 lb (87.1 kg)     Physical Exam Vitals and nursing note reviewed.  Constitutional:      General: She is not in acute distress.    Appearance: She is well-developed.   Cardiovascular:     Rate and Rhythm: Normal rate and regular rhythm.  Pulmonary:     Effort: Pulmonary effort is normal.     Breath sounds: Normal breath sounds.   Neurological:     Mental Status: She is alert and oriented to person, place, and time.       Assessment & Plan:   Anxiety and depression -     Venlafaxine  HCl ER; Take 1 capsule (150 mg total) by mouth daily with breakfast.  Dispense: 30 capsule; Refill: 0 -     Ambulatory referral to Psychiatry -     CBC -     Comprehensive metabolic panel with GFR  PTSD (post-traumatic stress disorder) -     hydrOXYzine  HCl; Take 1 tablet (25 mg total) by mouth 3 (three) times daily as needed for anxiety.  Dispense: 30 tablet; Refill: 0 -     Venlafaxine  HCl ER; Take 1 capsule (150 mg total) by mouth daily with breakfast.  Dispense: 30 capsule; Refill: 0  Insomnia, unspecified type -     QUEtiapine   Fumarate; Take 1 tablet (50 mg total) by mouth at bedtime.  Dispense: 30 tablet; Refill: 0  Heart burn -     Pantoprazole  Sodium; Take 1 tablet (40 mg total) by mouth daily.  Dispense: 30 tablet; Refill: 0  Encounter for screening involving social determinants of health (SDoH) -     AMB Referral [facility] Care Management  Other orders -     Ferrous Sulfate ; Take 1 tablet (325 mg total) by mouth daily with breakfast.  Dispense: 30 tablet; Refill: 2     Return if symptoms worsen or fail to improve.   Kayleanna Lorman  Samantha Cress, NP 05/16/2024

## 2024-05-17 LAB — COMPREHENSIVE METABOLIC PANEL WITH GFR
ALT: 15 IU/L (ref 0–32)
AST: 15 IU/L (ref 0–40)
Albumin: 3.9 g/dL (ref 3.9–4.9)
Alkaline Phosphatase: 89 IU/L (ref 44–121)
BUN/Creatinine Ratio: 10 (ref 9–23)
BUN: 7 mg/dL (ref 6–24)
Bilirubin Total: 0.4 mg/dL (ref 0.0–1.2)
CO2: 22 mmol/L (ref 20–29)
Calcium: 8.8 mg/dL (ref 8.7–10.2)
Chloride: 104 mmol/L (ref 96–106)
Creatinine, Ser: 0.67 mg/dL (ref 0.57–1.00)
Globulin, Total: 2.1 g/dL (ref 1.5–4.5)
Glucose: 101 mg/dL — ABNORMAL HIGH (ref 70–99)
Potassium: 4 mmol/L (ref 3.5–5.2)
Sodium: 138 mmol/L (ref 134–144)
Total Protein: 6 g/dL (ref 6.0–8.5)
eGFR: 113 mL/min/{1.73_m2} (ref >59)

## 2024-05-17 LAB — CBC
Hematocrit: 37 % (ref 34.0–46.6)
Hemoglobin: 10.9 g/dL — ABNORMAL LOW (ref 11.1–15.9)
MCH: 24.9 pg — ABNORMAL LOW (ref 26.6–33.0)
MCHC: 29.5 g/dL — ABNORMAL LOW (ref 31.5–35.7)
MCV: 85 fL (ref 79–97)
Platelets: 320 10*3/uL (ref 150–450)
RBC: 4.38 x10E6/uL (ref 3.77–5.28)
RDW: 15.6 % — ABNORMAL HIGH (ref 11.7–15.4)
WBC: 8.3 10*3/uL (ref 3.4–10.8)

## 2024-05-18 ENCOUNTER — Ambulatory Visit: Payer: Self-pay | Admitting: Nurse Practitioner

## 2024-05-19 ENCOUNTER — Telehealth: Payer: Self-pay | Admitting: *Deleted

## 2024-05-19 NOTE — Progress Notes (Signed)
 Complex Care Management Note Care Guide Note  05/19/2024 Name: Debbie Mueller MRN: [id number] DOB: [birth year]   Complex Care Management Outreach Attempts: An unsuccessful telephone outreach was attempted today to offer the patient information about available complex care management services.  Follow Up Plan:  Additional outreach attempts will be made to offer the patient complex care management information and services.   Encounter Outcome:  No Answer  [facility] Health Care Guide  Direct [phone] Fax:[phone] Website: [email or web address]

## 2024-05-20 ENCOUNTER — Telehealth: Payer: Self-pay | Admitting: *Deleted

## 2024-05-20 NOTE — Progress Notes (Signed)
 Complex Care Management Note Care Guide Note  05/20/2024 Name: LARHONDA DETTLOFF MRN: [id number] DOB: 1981/08/11   Complex Care Management Outreach Attempts: A second unsuccessful outreach was attempted today to offer the patient with information about available complex care management services.  Follow Up Plan:  Will outreach again   Encounter Outcome:  No Answer  mailbox is full   [facility] Health Care Guide  Direct Dial:[phone] Fax:[phone] Website: [email or web address]

## 2024-05-23 ENCOUNTER — Telehealth: Payer: Self-pay | Admitting: *Deleted

## 2024-05-23 ENCOUNTER — Other Ambulatory Visit: Payer: Self-pay | Admitting: Family Medicine

## 2024-05-23 DIAGNOSIS — F4310 Post-traumatic stress disorder, unspecified: Secondary | ICD-10-CM

## 2024-05-23 DIAGNOSIS — E538 Deficiency of other specified B group vitamins: Secondary | ICD-10-CM

## 2024-05-23 DIAGNOSIS — F419 Anxiety disorder, unspecified: Secondary | ICD-10-CM

## 2024-05-23 DIAGNOSIS — J301 Allergic rhinitis due to pollen: Secondary | ICD-10-CM

## 2024-05-23 DIAGNOSIS — R12 Heartburn: Secondary | ICD-10-CM

## 2024-05-23 DIAGNOSIS — G4700 Insomnia, unspecified: Secondary | ICD-10-CM

## 2024-05-23 NOTE — Telephone Encounter (Unsigned)
 Copied from CRM [id number]. Topic: Clinical - Medication Refill >> May 23, 2024  4:35 PM Tiffany S wrote: Medication: cyanocobalamin  1000 MCG tablet ferrous sulfate  325 (65 FE) MG tablet folic acid  (FOLVITE ) 1 MG tablet hydrOXYzine  (ATARAX ) 25 MG tablet loratadine  (CLARITIN ) 10 MG tablet melatonin 5 MG TABS nicotine  (NICODERM CQ  - DOSED IN MG/24 HOURS) 14 mg/24hr patch pantoprazole  (PROTONIX ) 40 MG tablet prazosin  (MINIPRESS ) 1 MG capsule QUEtiapine  (SEROQUEL ) 50 MG tablet venlafaxine  XR (EFFEXOR -XR) 150 MG 24 hr capsule Vitamin D , Ergocalciferol , (DRISDOL ) 1.25 MG (50000 UNIT) CAPS capsule vitamin D3 (CHOLECALCIFEROL ) 25 MCG tablet    Has the patient contacted their pharmacy? Yes (Agent: If no, request that the patient contact the pharmacy for the refill. If patient does not wish to contact the pharmacy document the reason why and proceed with request.) (Agent: If yes, when and what did the pharmacy advise?)  This is the patient's preferred pharmacy:  [address]  [place] [address] [phone]  Is this the correct pharmacy for this prescription? Yes If no, delete pharmacy and type the correct one.   Has the prescription been filled recently? Yes  Is the patient out of the medication? Yes  Has the patient been seen for an appointment in the last year OR does the patient have an upcoming appointment? Yes  Can we respond through MyChart? Yes  Agent: Please be advised that Rx refills may take up to 3 business days. We ask that you follow-up with your pharmacy.

## 2024-05-23 NOTE — Progress Notes (Signed)
 Complex Care Management Note Care Guide Note  05/23/2024 Name: Debbie Mueller MRN: [id number] DOB: 10/09/1981   Complex Care Management Outreach Attempts: A third unsuccessful outreach was attempted today to offer the patient with information about available complex care management services.  Follow Up Plan:  Additional outreach attempts will be made to offer the patient complex care management information and services.   Encounter Outcome:  No Answer [facility] Care Guide  Direct Dial:[phone] Fax:[phone] Website: [email or web address]

## 2024-05-24 MED ORDER — CYANOCOBALAMIN 1000 MCG PO TABS: 1000.0000 ug | ORAL_TABLET | Freq: Every day | ORAL | 0 refills | Status: AC

## 2024-05-24 MED ORDER — LORATADINE 10 MG PO TABS
10.0000 mg | ORAL_TABLET | Freq: Every day | ORAL | 0 refills | Status: AC
Start: 2024-05-24 — End: ?

## 2024-05-24 MED ORDER — NICOTINE 14 MG/24HR TD PT24: 14.0000 mg | MEDICATED_PATCH | Freq: Every day | TRANSDERMAL | 0 refills | Status: AC

## 2024-05-24 MED ORDER — FOLIC ACID 1 MG PO TABS: 1.0000 mg | ORAL_TABLET | Freq: Every day | ORAL | 0 refills | Status: AC

## 2024-05-24 MED ORDER — PANTOPRAZOLE SODIUM 40 MG PO TBEC: 40.0000 mg | DELAYED_RELEASE_TABLET | Freq: Every day | ORAL | 0 refills | Status: AC

## 2024-05-24 MED ORDER — MELATONIN 5 MG PO TABS: 5.0000 mg | ORAL_TABLET | Freq: Every day | ORAL | 0 refills | Status: AC

## 2024-05-25 ENCOUNTER — Telehealth: Payer: Self-pay | Admitting: Nurse Practitioner

## 2024-05-25 NOTE — Telephone Encounter (Signed)
 Copied from CRM [id number]. Topic: General - Other >> May 25, 2024  2:46 PM Winona R wrote: Pt needs health [employer] paper work that was filled out in office faxed over to [employer] as they would note accept it in person. Please fax it to the fax number listed on the form.

## 2024-05-26 ENCOUNTER — Telehealth: Payer: Self-pay | Admitting: Family Medicine

## 2024-05-26 NOTE — Telephone Encounter (Unsigned)
 Copied from CRM [id number]. Topic: General - Billing Inquiry >> May 26, 2024  1:10 PM Donee H wrote: Reason for CRM: Patient call to state insurance was not file on recent visit. She stated did not have insurance card time of visit but insurance was active. Patient states has Medicaid [place]  member Id:  [id number]  Date of visit was May 16, 2024  With Bascom Borer

## 2024-06-09 ENCOUNTER — Ambulatory Visit: Admitting: Family Medicine

## 2024-06-15 ENCOUNTER — Encounter: Payer: Self-pay | Admitting: Family Medicine
